# Patient Record
Sex: Male | Born: 1945 | Race: White | Hispanic: No | Marital: Married | Smoking: Former smoker
Health system: Southern US, Community
[De-identification: ages and names within clinical notes are randomized; demographics above are authoritative.]

## PROBLEM LIST (undated history)

## (undated) DIAGNOSIS — F40240 Claustrophobia: Secondary | ICD-10-CM

## (undated) DIAGNOSIS — K219 Gastro-esophageal reflux disease without esophagitis: Secondary | ICD-10-CM

## (undated) DIAGNOSIS — I10 Essential (primary) hypertension: Secondary | ICD-10-CM

## (undated) DIAGNOSIS — Z96651 Presence of right artificial knee joint: Secondary | ICD-10-CM

## (undated) DIAGNOSIS — G540 Brachial plexus disorders: Secondary | ICD-10-CM

## (undated) DIAGNOSIS — K5730 Diverticulosis of large intestine without perforation or abscess without bleeding: Secondary | ICD-10-CM

## (undated) DIAGNOSIS — F4310 Post-traumatic stress disorder, unspecified: Secondary | ICD-10-CM

## (undated) DIAGNOSIS — F419 Anxiety disorder, unspecified: Secondary | ICD-10-CM

## (undated) DIAGNOSIS — J310 Chronic rhinitis: Secondary | ICD-10-CM

## (undated) DIAGNOSIS — Z87898 Personal history of other specified conditions: Secondary | ICD-10-CM

## (undated) DIAGNOSIS — E785 Hyperlipidemia, unspecified: Secondary | ICD-10-CM

## (undated) DIAGNOSIS — M1990 Unspecified osteoarthritis, unspecified site: Secondary | ICD-10-CM

## (undated) HISTORY — DX: Brachial plexus disorders: G54.0

## (undated) HISTORY — DX: Presence of right artificial knee joint: Z96.651

## (undated) HISTORY — DX: Hyperlipidemia, unspecified: E78.5

## (undated) HISTORY — DX: Gastro-esophageal reflux disease without esophagitis: K21.9

## (undated) HISTORY — DX: Chronic rhinitis: J31.0

## (undated) HISTORY — PX: TONSILLECTOMY: SUR1361

## (undated) HISTORY — DX: Anxiety disorder, unspecified: F41.9

## (undated) HISTORY — DX: Unspecified osteoarthritis, unspecified site: M19.90

## (undated) HISTORY — PX: COLONOSCOPY: SHX174

## (undated) HISTORY — PX: CATARACT EXTRACTION: SUR2

## (undated) HISTORY — DX: Diverticulosis of large intestine without perforation or abscess without bleeding: K57.30

## (undated) HISTORY — DX: Personal history of other specified conditions: Z87.898

---

## 1998-02-12 ENCOUNTER — Emergency Department (HOSPITAL_COMMUNITY): Admission: EM | Admit: 1998-02-12 | Discharge: 1998-02-12 | Payer: Self-pay | Admitting: Emergency Medicine

## 1998-02-14 ENCOUNTER — Emergency Department (HOSPITAL_COMMUNITY): Admission: EM | Admit: 1998-02-14 | Discharge: 1998-02-14 | Payer: Self-pay | Admitting: Emergency Medicine

## 1999-07-11 ENCOUNTER — Ambulatory Visit (HOSPITAL_COMMUNITY): Admission: RE | Admit: 1999-07-11 | Discharge: 1999-07-11 | Payer: Self-pay | Admitting: Specialist

## 1999-07-11 ENCOUNTER — Encounter: Payer: Self-pay | Admitting: Specialist

## 1999-08-05 ENCOUNTER — Ambulatory Visit (HOSPITAL_COMMUNITY): Admission: RE | Admit: 1999-08-05 | Discharge: 1999-08-05 | Payer: Self-pay | Admitting: Neurosurgery

## 1999-08-05 ENCOUNTER — Encounter: Payer: Self-pay | Admitting: Neurosurgery

## 1999-09-29 ENCOUNTER — Encounter: Payer: Self-pay | Admitting: Neurosurgery

## 1999-09-29 ENCOUNTER — Ambulatory Visit (HOSPITAL_COMMUNITY): Admission: RE | Admit: 1999-09-29 | Discharge: 1999-09-29 | Payer: Self-pay | Admitting: Neurosurgery

## 2000-12-12 ENCOUNTER — Ambulatory Visit: Admission: RE | Admit: 2000-12-12 | Discharge: 2000-12-12 | Payer: Self-pay | Admitting: Internal Medicine

## 2001-07-16 ENCOUNTER — Ambulatory Visit (HOSPITAL_COMMUNITY): Admission: RE | Admit: 2001-07-16 | Discharge: 2001-07-16 | Payer: Self-pay | Admitting: Internal Medicine

## 2002-06-17 ENCOUNTER — Encounter: Admission: RE | Admit: 2002-06-17 | Discharge: 2002-06-17 | Payer: Self-pay | Admitting: Internal Medicine

## 2002-07-07 ENCOUNTER — Ambulatory Visit (HOSPITAL_COMMUNITY): Admission: RE | Admit: 2002-07-07 | Discharge: 2002-07-07 | Payer: Self-pay | Admitting: Specialist

## 2003-02-24 ENCOUNTER — Encounter: Admission: RE | Admit: 2003-02-24 | Discharge: 2003-02-24 | Payer: Self-pay | Admitting: Neurosurgery

## 2003-02-24 ENCOUNTER — Encounter: Payer: Self-pay | Admitting: Neurosurgery

## 2004-08-22 ENCOUNTER — Ambulatory Visit: Payer: Self-pay | Admitting: Internal Medicine

## 2004-12-05 ENCOUNTER — Ambulatory Visit: Payer: Self-pay | Admitting: Internal Medicine

## 2006-04-11 ENCOUNTER — Ambulatory Visit: Payer: Self-pay | Admitting: Internal Medicine

## 2006-07-09 ENCOUNTER — Ambulatory Visit: Payer: Self-pay | Admitting: Internal Medicine

## 2006-09-03 ENCOUNTER — Ambulatory Visit: Payer: Self-pay | Admitting: Internal Medicine

## 2006-10-08 ENCOUNTER — Ambulatory Visit: Payer: Self-pay | Admitting: Internal Medicine

## 2006-10-08 LAB — CONVERTED CEMR LAB
Albumin: 3.8 g/dL (ref 3.5–5.2)
Alkaline Phosphatase: 56 units/L (ref 39–117)
Basophils Absolute: 0 10*3/uL (ref 0.0–0.1)
CO2: 30 meq/L (ref 19–32)
Chol/HDL Ratio, serum: 4.6
Glomerular Filtration Rate, Af Am: 79 mL/min/{1.73_m2}
Glucose, Bld: 98 mg/dL (ref 70–99)
Ketones, ur: NEGATIVE mg/dL
LDL DIRECT: 142.7 mg/dL
Lymphocytes Relative: 26.9 % (ref 12.0–46.0)
Monocytes Relative: 10.5 % (ref 3.0–11.0)
Neutro Abs: 2.8 10*3/uL (ref 1.4–7.7)
Nitrite: NEGATIVE
Platelets: 239 10*3/uL (ref 150–400)
Potassium: 4.6 meq/L (ref 3.5–5.1)
RDW: 13.2 % (ref 11.5–14.6)
TSH: 2.52 microintl units/mL (ref 0.35–5.50)
Total Bilirubin: 1.1 mg/dL (ref 0.3–1.2)
Total Protein: 7 g/dL (ref 6.0–8.3)
Urobilinogen, UA: 0.2 (ref 0.0–1.0)
VLDL: 12 mg/dL (ref 0–40)
pH: 6 (ref 5.0–8.0)

## 2007-09-26 HISTORY — PX: CERVICAL DISC SURGERY: SHX588

## 2007-11-12 ENCOUNTER — Ambulatory Visit (HOSPITAL_COMMUNITY): Admission: RE | Admit: 2007-11-12 | Discharge: 2007-11-13 | Payer: Self-pay | Admitting: Neurosurgery

## 2008-01-09 ENCOUNTER — Encounter: Admission: RE | Admit: 2008-01-09 | Discharge: 2008-01-09 | Payer: Self-pay | Admitting: Neurosurgery

## 2008-03-18 ENCOUNTER — Ambulatory Visit: Payer: Self-pay | Admitting: Gastroenterology

## 2008-03-23 ENCOUNTER — Ambulatory Visit: Payer: Self-pay | Admitting: Gastroenterology

## 2008-09-16 ENCOUNTER — Ambulatory Visit: Payer: Self-pay | Admitting: Internal Medicine

## 2008-09-16 DIAGNOSIS — F411 Generalized anxiety disorder: Secondary | ICD-10-CM | POA: Insufficient documentation

## 2008-09-16 DIAGNOSIS — K5730 Diverticulosis of large intestine without perforation or abscess without bleeding: Secondary | ICD-10-CM | POA: Insufficient documentation

## 2008-09-16 DIAGNOSIS — E785 Hyperlipidemia, unspecified: Secondary | ICD-10-CM | POA: Insufficient documentation

## 2008-09-16 DIAGNOSIS — J069 Acute upper respiratory infection, unspecified: Secondary | ICD-10-CM | POA: Insufficient documentation

## 2008-09-16 DIAGNOSIS — M179 Osteoarthritis of knee, unspecified: Secondary | ICD-10-CM | POA: Insufficient documentation

## 2008-09-16 DIAGNOSIS — R51 Headache: Secondary | ICD-10-CM | POA: Insufficient documentation

## 2008-09-16 DIAGNOSIS — J310 Chronic rhinitis: Secondary | ICD-10-CM | POA: Insufficient documentation

## 2008-09-16 DIAGNOSIS — M1711 Unilateral primary osteoarthritis, right knee: Secondary | ICD-10-CM

## 2008-09-16 DIAGNOSIS — R42 Dizziness and giddiness: Secondary | ICD-10-CM | POA: Insufficient documentation

## 2008-09-16 DIAGNOSIS — R519 Headache, unspecified: Secondary | ICD-10-CM | POA: Insufficient documentation

## 2008-10-27 ENCOUNTER — Ambulatory Visit: Payer: Self-pay | Admitting: Internal Medicine

## 2008-10-27 DIAGNOSIS — R05 Cough: Secondary | ICD-10-CM

## 2008-10-27 DIAGNOSIS — R059 Cough, unspecified: Secondary | ICD-10-CM | POA: Insufficient documentation

## 2009-01-01 IMAGING — RF DG CERVICAL SPINE 1V
1 series · 1 of 1 positions shown · non-contrast
Comparison: none

CLINICAL DATA: Herniated nucleus pulposus.
 CERVICAL SPINE ? 1 VIEW:

[Series 1: run · 1 of 1 slices shown]
[im 1/1]
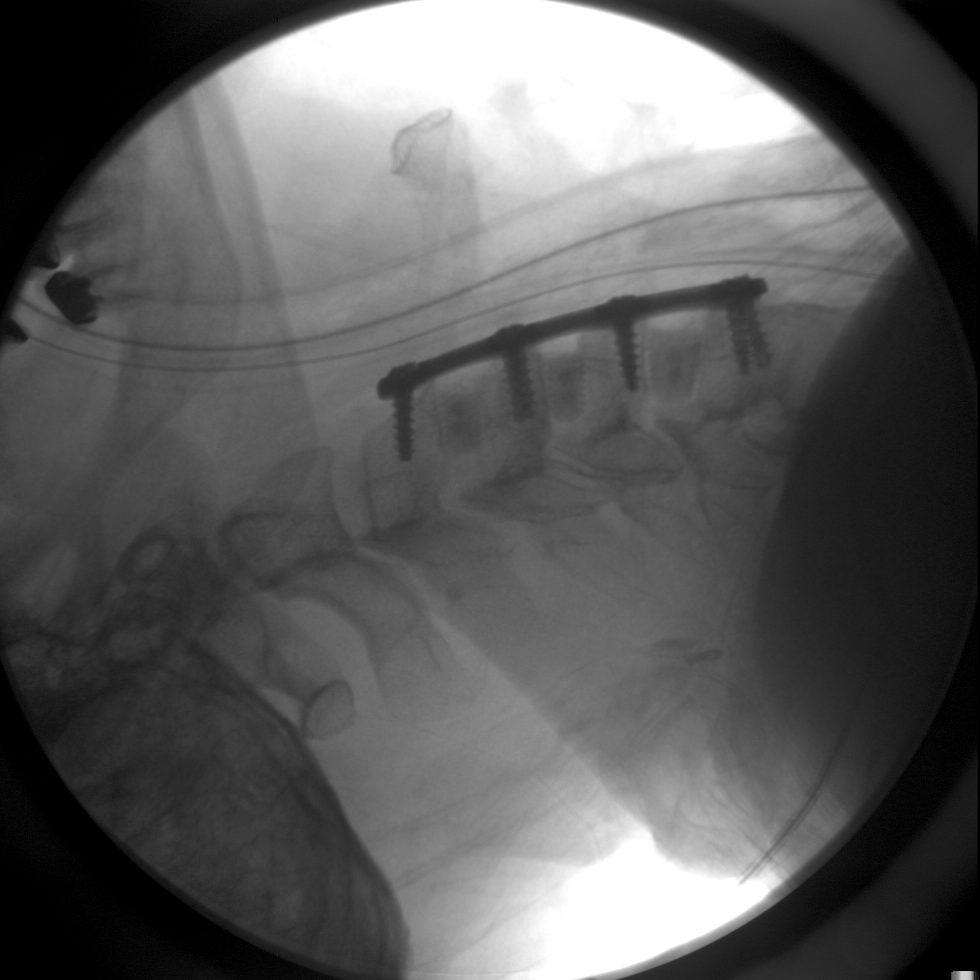

[1 of 1 positions shown; findings below may reference images not displayed]

FINDINGS: Anterior plates and screws with interposed tubular bone grafts are present for C-3, C-4, C-5 and C-6 fusion.  There is anatomic alignment of the vertebral bodies.  There is no breakage or loosening of the hardware. 
 Previous fusion at C6-7 is noted.
IMPRESSION: Anterior fusion C-3 through C-6.

## 2009-08-05 ENCOUNTER — Ambulatory Visit: Payer: Self-pay | Admitting: Internal Medicine

## 2009-08-06 LAB — CONVERTED CEMR LAB
ALT: 24 units/L (ref 0–53)
AST: 30 units/L (ref 0–37)
Albumin: 4.2 g/dL (ref 3.5–5.2)
Alkaline Phosphatase: 60 units/L (ref 39–117)
Basophils Relative: 0.3 % (ref 0.0–3.0)
Eosinophils Relative: 6.7 % — ABNORMAL HIGH (ref 0.0–5.0)
GFR calc non Af Amer: 79.94 mL/min (ref >60)
Glucose, Bld: 92 mg/dL (ref 70–99)
HCT: 43 % (ref 39.0–52.0)
Hemoglobin: 14.7 g/dL (ref 13.0–17.0)
LDL Cholesterol: 144 mg/dL — ABNORMAL HIGH (ref 0–99)
Lymphocytes Relative: 25.3 % (ref 12.0–46.0)
Lymphs Abs: 1.4 10*3/uL (ref 0.7–4.0)
Monocytes Relative: 11.3 % (ref 3.0–12.0)
Neutro Abs: 3 10*3/uL (ref 1.4–7.7)
Potassium: 5 meq/L (ref 3.5–5.1)
RBC: 4.54 M/uL (ref 4.22–5.81)
Sodium: 141 meq/L (ref 135–145)
TSH: 1.94 microintl units/mL (ref 0.35–5.50)
Total CHOL/HDL Ratio: 5
VLDL: 11 mg/dL (ref 0.0–40.0)
WBC: 5.4 10*3/uL (ref 4.5–10.5)

## 2009-12-16 ENCOUNTER — Ambulatory Visit: Payer: Self-pay | Admitting: Internal Medicine

## 2010-09-06 ENCOUNTER — Encounter: Payer: Self-pay | Admitting: Internal Medicine

## 2010-10-25 NOTE — Assessment & Plan Note (Signed)
Summary: Primary svc/ acute cough eval   Primary Provider/Referring Provider:  Sherene Sires  CC:  Acute visit.  Pt c/o "croupy cough" x 5 days.  He states that cough is prod but unable to bring up any sputum.  Marland Kitchen  History of Present Illness: 63 yowm quit smoking mid 20's  follwed here for DJD and hyperlipidemia.   October 27, 2008 ov co at bedtime nasal obstruction and sensation of drainage when lies on back but no sign excess mucus indolent onset, persistent x "years and years". No improvement of this co with nasal steroids, abx, decongestants.    August 05, 2009 cpx states nasal symptoms better with saline not using afrin at all now.   December 16, 2009 Acute visit.  Pt c/o "croupy cough" x 5 days.  He states that cough is prod but unable to bring up any sputum.  Started with throat irritation, water rhinitis that turned yellow. Pt denies any significant dysphagia, itching, sneezing,   fever, chills, sweats, unintended wt loss, pleuritic or exertional cp, hempoptysis, change in activity tolerance  orthopnea pnd or leg swelling. Pt also denies any obvious fluctuation in symptoms with weather or environmental change or other alleviating or aggravating factors although thinks it may be related to spring x last 10 year pattern.  Current Medications (verified): 1)  Bayer Aspirin Ec Low Dose 81 Mg Tbec (Aspirin) .... Take 1 Capsule By Mouth Once A Day 2)  Celebrex 200 Mg Caps (Celecoxib) .Marland Kitchen.. 1 Two Times A Day As Needed With Meals 3)  Fish Oil 1000 Mg Caps (Omega-3 Fatty Acids) .Marland Kitchen.. 1 Once Daily 4)  Multivitamins  Tabs (Multiple Vitamin) .Marland Kitchen.. 1 Once Daily  Allergies (verified): No Known Drug Allergies  Past History:  Past Medical History: HYPERLIPIDEMIA (ICD-272.4)    - Target LDL < 130 male gender, pos fm hx DEGENERATIVE JOINT DISEASE (ICD-715.90) DIVERTICULOSIS OF COLON (ICD-562.10)........................................Marland KitchenPatterson    - Colonoscopy 04/20/03 and 02/2008 CHRONIC RHINITIS  (ICD-472.0) ANXIETY, CHRONIC (ICD-300.00) HEADACHE (ICD-784.0) Hx of VERTIGO (ICD-780.4) GERD     - Pos EGD 1991 for GERD with 3-4 cm HH Health Maintenance............................................................Marland KitchenWert    -  DT 3/06    -  Pneumovax 10/7    -  CPX  August 05, 2009   Vital Signs:  Patient profile:   65 year old male Weight:      189 pounds O2 Sat:      98 % on Room air Temp:     98.0 degrees F oral Pulse rate:   72 / minute BP sitting:   152 / 82  (left arm)  Vitals Entered By: Vernie Murders (December 16, 2009 9:27 AM)  O2 Flow:  Room air  Physical Exam  Additional Exam:  wt 195 > 197 October 27, 2008 > 194 August 05, 2009 > 189 December 16, 2009  GEN: A/Ox3; pleasant , NAD  with honking upper airway cough and nasal tone to voice HEENT:  [place]/AT, , EACs-clear, TMs-wnl, NOSE-pale, nontender sinus , THROAT-clear NECK:  Supple w/ fair ROM; no JVD; normal carotid impulses w/o bruits; no thyromegaly or nodules palpated; no lymphadenopathy. CHEST:  Clear to P & A; w/o, wheezes/ rales/ or rhonchi. HEART:  RRR, no m/r/g   ABDOMEN:  Soft & nt; nml bowel sounds; no organomegaly or masses detected. EXT: Warm bil,  no calf pain, edema, clubbing, pulses intact    Impression & Recommendations:  Problem # 1:  UPPER RESPIRATORY INFECTION (ICD-465.9)  Explained natural h/o uri and  why it's necessary in patients at risk to rx short term with PPI to reduce risk of evolving cyclical cough triggered by epithelial injury and a heightened sensitivty to the effects of any upper airway irritants,  most importantly acid - related.   Each maintenance medication was reviewed in detail including most importantly the difference between maintenance and as needed and under what circumstances the prns are to be used. See instructions for specific recommendations     Orders: Est. Patient Level IV (46962)  Problem # 2:  CHRONIC RHINITIS (ICD-472.0)  as long as just seasonal can do prns  otc, reviewed  Orders: Est. Patient Level IV (95284)  Medications Added to Medication List This Visit: 1)  Prednisone 10 Mg Tabs (Prednisone) .... 4 each am x 2days, 2x2days, 1x2days and stop 2)  Doxycycline Hyclate 100 Mg Caps (Doxycycline hyclate) .... One twice daily before meals  Patient Instructions: 1)  Acid reflux is a eading suspect here and needs to be eliminated  completely before considering additional studies or treatment options. To suppress this maximally, take Nexium 30 min before first  meal and zantac 150 (otc) at bedtime plus diet measures as listed.  2)  Prednisone 4 each am x 2days, 2x2days, 1x2days and stop 3)  Doxycycline 100 mg twice daily before meals x 7 days only 4)  Mucinex dm 1-2 every 12hours for cough 5)  GERD (REFLUX)  is a common cause of respiratory symptoms. It commonly presents without heartburn and can be treated with medication, but also with lifestyle changes including avoidance of late meals, excessive alcohol, smoking cessation, and avoid fatty foods, chocolate, peppermint, colas, red wine, and acidic juices such as orange juice. NO MINT OR MENTHOL PRODUCTS SO NO COUGH DROPS  6)  USE SUGARLESS CANDY INSTEAD (jolley ranchers)  7)  NO OIL BASED VITAMINS  Prescriptions: DOXYCYCLINE HYCLATE 100 MG CAPS (DOXYCYCLINE HYCLATE) one twice daily before meals  #14 x 0   Entered and Authorized by:   Nyoka Cowden MD   Signed by:   Nyoka Cowden MD on 12/16/2009   Method used:   [facility] to        [facility] [phone]* (retail)       [address]       [place], [place]  [address]       Ph: [phone]       Fax: [phone]   RxID:   [id number] PREDNISONE 10 MG  TABS (PREDNISONE) 4 each am x 2days, 2x2days, 1x2days and stop  #14 x 0   Entered and Authorized by:   Nyoka Cowden MD   Signed by:   Nyoka Cowden MD on 12/16/2009   Method used:   [facility]ically to        [facility] [phone]* (retail)        [address]       [place], [place]  [address]       Ph: [phone]       Fax: [phone]   RxID:   [id number]

## 2010-10-27 NOTE — Letter (Signed)
Summary: [facility]   Imported By: Lester Chester 10/05/2010 10:36:17  _____________________________________________________________________  External Attachment:    Type:   Image     Comment:   External Document

## 2010-11-10 ENCOUNTER — Other Ambulatory Visit: Payer: Self-pay | Admitting: Internal Medicine

## 2010-11-10 ENCOUNTER — Encounter (INDEPENDENT_AMBULATORY_CARE_PROVIDER_SITE_OTHER): Payer: Self-pay | Admitting: *Deleted

## 2010-11-10 ENCOUNTER — Encounter: Payer: Self-pay | Admitting: Internal Medicine

## 2010-11-10 ENCOUNTER — Encounter (INDEPENDENT_AMBULATORY_CARE_PROVIDER_SITE_OTHER): Payer: MEDICARE | Admitting: Internal Medicine

## 2010-11-10 ENCOUNTER — Other Ambulatory Visit: Payer: MEDICARE

## 2010-11-10 DIAGNOSIS — Z0000 Encounter for general adult medical examination without abnormal findings: Secondary | ICD-10-CM

## 2010-11-10 DIAGNOSIS — Z23 Encounter for immunization: Secondary | ICD-10-CM

## 2010-11-10 DIAGNOSIS — E785 Hyperlipidemia, unspecified: Secondary | ICD-10-CM

## 2010-11-10 LAB — LIPID PANEL
LDL Cholesterol: 143 mg/dL — ABNORMAL HIGH (ref 0–99)
Total CHOL/HDL Ratio: 4
VLDL: 11.8 mg/dL (ref 0.0–40.0)

## 2010-11-10 LAB — BASIC METABOLIC PANEL
Chloride: 105 mEq/L (ref 96–112)
Creatinine, Ser: 1 mg/dL (ref 0.4–1.5)
GFR: 84.48 mL/min (ref >60.00)
Potassium: 5.1 mEq/L (ref 3.5–5.1)

## 2010-11-10 LAB — URINALYSIS
Bilirubin Urine: NEGATIVE
Ketones, ur: NEGATIVE
Leukocytes, UA: NEGATIVE
Specific Gravity, Urine: 1.02 (ref 1.000–1.030)
Total Protein, Urine: NEGATIVE
Urine Glucose: NEGATIVE
pH: 7.5 (ref 5.0–8.0)

## 2010-11-10 LAB — HEPATIC FUNCTION PANEL
ALT: 20 U/L (ref 0–53)
AST: 24 U/L (ref 0–37)
Bilirubin, Direct: 0.1 mg/dL (ref 0.0–0.3)
Total Bilirubin: 0.8 mg/dL (ref 0.3–1.2)
Total Protein: 6.7 g/dL (ref 6.0–8.3)

## 2010-11-10 LAB — CBC WITH DIFFERENTIAL/PLATELET
Basophils Relative: 0.5 % (ref 0.0–3.0)
Eosinophils Relative: 4.6 % (ref 0.0–5.0)
HCT: 37.7 % — ABNORMAL LOW (ref 39.0–52.0)
MCV: 92.8 fl (ref 78.0–100.0)
Monocytes Absolute: 0.5 10*3/uL (ref 0.1–1.0)
Monocytes Relative: 10.2 % (ref 3.0–12.0)
Neutrophils Relative %: 62.4 % (ref 43.0–77.0)
Platelets: 233 10*3/uL (ref 150.0–400.0)
RBC: 4.06 Mil/uL — ABNORMAL LOW (ref 4.22–5.81)
WBC: 5.2 10*3/uL (ref 4.5–10.5)

## 2010-11-10 LAB — CONVERTED CEMR LAB
PSA, Free Pct: 37 (ref >25)
PSA, Free: 0.2 ng/mL

## 2010-11-10 LAB — HIGH SENSITIVITY CRP: CRP, High Sensitivity: 0.98 mg/L (ref 0.00–5.00)

## 2010-11-14 ENCOUNTER — Telehealth (INDEPENDENT_AMBULATORY_CARE_PROVIDER_SITE_OTHER): Payer: Self-pay | Admitting: *Deleted

## 2010-11-16 NOTE — Assessment & Plan Note (Signed)
Summary: Primary svc/ CPX    Primary Provider/Referring Provider:  Sherene Sires  CC:  cpx fasting.  History of Present Illness: 65  yowm quit smoking mid 20's  follwed here for DJD and hyperlipidemia.   October 27, 2008 ov co at bedtime nasal obstruction and sensation of drainage when lies on back but no sign excess mucus indolent onset, persistent x "years and years". No improvement of this co with nasal steroids, abx, decongestants.    August 05, 2009 cpx states nasal symptoms better with saline not using afrin at all now.   December 16, 2009 Acute visit.  Pt c/o "croupy cough" x 5 days.  He states that cough is prod but unable to bring up any sputum.  Started with throat irritation, water rhinitis that turned yellow.  rx with doxy and short term gerd rx  November 10, 2010 cpx with chronic sleep disorder (? [facility]) and nasal stuffiness with overuse of afrin and underuse of nasonex.  no purulent nasal secretions. Pt denies any significant sore throat, dysphagia, itching, sneezing,  nasal congestion or excess secretions,  fever, chills, sweats, unintended wt loss, pleuritic or exertional cp, hempoptysis,   orthopnea pnd or leg swelling.  Pt also denies any obvious fluctuation in symptoms with weather or environmental change or other alleviating or aggravating factors.       Current Medications (verified): 1)  Bayer Aspirin Ec Low Dose 81 Mg Tbec (Aspirin) .... Take 1 Capsule By Mouth Once A Day 2)  Celebrex 200 Mg Caps (Celecoxib) .Marland Kitchen.. 1 Two Times A Day As Needed With Meals 3)  Multivitamins  Tabs (Multiple Vitamin) .Marland Kitchen.. 1 Once Daily 4)  Xanax 1 Mg Tabs (Alprazolam) .Marland Kitchen.. 1 At Bedtime As Needed  Allergies (verified): No Known Drug Allergies  Past History:  Past Medical History: HYPERLIPIDEMIA (ICD-272.4)    - Target LDL < 130 male gender, pos fm hx DEGENERATIVE JOINT DISEASE (ICD-715.90) DIVERTICULOSIS OF COLON (ICD-562.10)........................................Marland KitchenJarold Motto    -  Colonoscopy 04/20/03 and 02/2008 CHRONIC RHINITIS (ICD-472.0) ANXIETY, CHRONIC (ICD-300.00) > [facility] HEADACHE (ICD-784.0) Hx of VERTIGO (ICD-780.4) GERD     - Pos EGD 1991 for GERD with 3-4 cm HH Health Maintenance............................................................Marland KitchenWert    -  DT 11/2004    -  Pneumovax November 10, 2010     -  CPX  November 10, 2010   Family History: heart disease in father age 53's colon cancer paternal side uncle and aunt anxiety in sister/ hypochondriac per pt  Social History: Patient states former smoker,  quit around 13 [employer] park [employer]- retired Works out at [employer] and volunteers at [employer]  Vital Signs:  Patient profile:   65 year old male Weight:      192 pounds BMI:     25.42 O2 Sat:      98 % on Room air Temp:     97.8 degrees F oral Pulse rate:   62 / minute BP sitting:   100 / 60  (left arm)  Vitals Entered By: Vernie Murders (November 10, 2010 8:48 AM)  O2 Flow:  Room air  Physical Exam  Additional Exam:  wt 195 > 197 October 27, 2008 > 194 August 05, 2009 > 189 December 16, 2009 > 192 November 10, 2010  GEN: A/Ox3; pleasant , NAD   HEENT:  /AT, , EACs-clear, TMs-wnl, NOSE-pale, nontender sinus , THROAT-clear NECK:  Supple w/ fair ROM; no JVD; normal carotid impulses w/o bruits; no thyromegaly or nodules  palpated; no lymphadenopathy. CHEST:  Clear to P & A; w/o, wheezes/ rales/ or rhonchi. HEART:  RRR, no m/r/g   ABDOMEN:  Soft & nt; nml bowel sounds; no organomegaly or masses detected. EXT: Warm bil,  no calf pain, edema, clubbing, pulses intact GU uncircum testes down bilaterally no IH or nodules Rectal mod ext hemorrhoids/ nl prostate/ stool g neg  MS nl gait and station, no obvious joint restriction/ swelling Cholesterol               200 mg/dL                   1-478     ATP III Classification            Desirable:  < 200 mg/dL                    Borderline High:  200 - 239 mg/dL                High:  > = 240 mg/dL   Triglycerides             59.0 mg/dL                  2.9-562.1     Normal:  <150 mg/dL     Borderline High:  308 - 199 mg/dL   HDL                       65.78 mg/dL                 >46.96   VLDL Cholesterol          11.8 mg/dL                  2.9-52.8   LDL Cholesterol      [H]  413 mg/dL                   2-44  CHO/HDL Ratio:  CHD Risk                             4                    Men          Women     1/2 Average Risk     3.4          3.3     Average Risk          5.0          4.4     2X Average Risk          9.6          7.1     3X Average Risk          15.0          11.0                           Tests: (2) BMP (METABOL)   Sodium                    140 mEq/L                   135-145   Potassium  5.1 mEq/L                   3.5-5.1   Chloride                  105 mEq/L                   96-112   Carbon Dioxide            29 mEq/L                    19-32   Glucose                   82 mg/dL                    13-08   BUN                       21 mg/dL                    6-57   Creatinine                1.0 mg/dL                   8.4-6.9   Calcium                   8.8 mg/dL                   6.2-95.2   GFR                       84.48 mL/min                >60.00  Tests: (3) CBC Platelet w/Diff (CBCD)   White Cell Count          5.2 K/uL                    4.5-10.5   Red Cell Count       [L]  4.06 Mil/uL                 4.22-5.81   Hemoglobin           [L]  12.8 g/dL                   84.1-32.4   Hematocrit           [L]  37.7 %                      39.0-52.0   MCV                       92.8 fl                     78.0-100.0   MCHC                      34.0 g/dL                   40.1-02.7   RDW                       14.5 %  11.5-14.6   Platelet Count            233.0 K/uL                  150.0-400.0   Neutrophil %              62.4 %                      43.0-77.0   Lymphocyte %              22.3 %                       12.0-46.0   Monocyte %                10.2 %                      3.0-12.0   Eosinophils%              4.6 %                       0.0-5.0   Basophils %               0.5 %                       0.0-3.0   Neutrophill Absolute      3.2 K/uL                    1.4-7.7   Lymphocyte Absolute       1.2 K/uL                    0.7-4.0   Monocyte Absolute         0.5 K/uL                    0.1-1.0  Eosinophils, Absolute                             0.2 K/uL                    0.0-0.7   Basophils Absolute        0.0 K/uL                    0.0-0.1  Tests: (4) Hepatic/Liver Function Panel (HEPATIC)   Total Bilirubin           0.8 mg/dL                   7.8-2.9   Direct Bilirubin          0.1 mg/dL                   5.6-2.1   Alkaline Phosphatase      54 U/L                      39-117   AST                       24 U/L                      0-37   ALT  20 U/L                      0-53   Total Protein             6.7 g/dL                    1.6-1.0   Albumin                   3.9 g/dL                    9.6-0.4  Tests: (5) TSH (TSH)   FastTSH                   2.10 uIU/mL                 0.35-5.50  Tests: (6) Full Range CRP (FCRP)   CRPH                      0.98 mg/L                   0.00-5.00     Note:  An elevated hs-CRP (>5 mg/L) should be repeated after 2 weeks to rule out recent infection or trauma.  Tests: (7) UDip Only (UDIP)   Color                     LT. YELLOW       RANGE:  Yellow;Lt. Yellow   Clarity                   CLEAR                       Clear   Specific Gravity          1.020                       1.000 - 1.030   Urine Ph                  7.5                         5.0-8.0   Protein                   NEGATIVE                    Negative   Urine Glucose             NEGATIVE                    Negative   Ketones                   NEGATIVE                    Negative   Urine Bilirubin           NEGATIVE                    Negative   Blood                      NEGATIVE                    Negative  Urobilinogen              0.2                         0.0 - 1.0   Leukocyte Esterace        NEGATIVE                    Negative   Nitrite                   NEGATIVE                    Negative   Tests: (1) PSA, Total and Free (3515)   PSA                       0.54 ng/mL                  <=4.00     Test Methodology: Hybritech PSA   PSA, Free                 0.2 ng/mL   PSA, %Free                37 %                        > 25  Impression & Recommendations:  Problem # 1:  HYPERLIPIDEMIA (ICD-272.4) Target LDL < 130 due to type A and male with pos fm hx  Labs Reviewed: SGOT: 30 (08/05/2009)   SGPT: 24 (08/05/2009)   HDL:44.20 (08/05/2009), 44.6 (10/08/2006)  LDL:144 (08/05/2009),  vs 143 November 10, 2010  DEL (10/08/2006)  Chol:199 (08/05/2009), 206 (10/08/2006)  Trig:55.0 (08/05/2009), 59 (10/08/2006)  Problem # 2:  CHRONIC RHINITIS (ICD-472.0) I emphasized that nasal steroids have no immediate benefit in terms of improving symptoms.  To help them reached the target tissue, the patient should use Afrin two puffs every 12 hours applied one min before using the nasal steroids.  Afrin should be stopped after no more than 5 days.  If the symptoms worsen, Afrin can be restarted after 5 days off of therapy to prevent rebound congestion from overuse of Afrin.  I also emphasized that in no way are nasal steroids a concern in terms of "addiction".      Problem # 3:  ANXIETY, CHRONIC (ICD-300.00)  His updated medication list for this problem includes:    Xanax 1 Mg Tabs (Alprazolam) .Marland Kitchen... 1 at bedtime as needed  Per [facility], all benzo's at one source  See instructions for specific recommendations   Medications Added to Medication List This Visit: 1)  Xanax 1 Mg Tabs (Alprazolam) .Marland Kitchen.. 1 at bedtime as needed  Other Orders: Est. Patient 65& > ([address]) T-PSA Total ([address]) Pneumococcal Vaccine (19147) Admin 1st Vaccine  [phone]) Admin 1st Vaccine (State) (90471S) TLB-Lipid Panel (80061-LIPID) TLB-BMP (Basic Metabolic Panel-BMET) (80048-METABOL) TLB-CBC Platelet - w/Differential (85025-CBCD) TLB-Hepatic/Liver Function Pnl (80076-HEPATIC) TLB-TSH (Thyroid Stimulating Hormone) (84443-TSH) TLB-CRP-High Sensitivity (C-Reactive Protein) (86140-FCRP) TLB-Udip ONLY (81003-UDIP)  Patient Instructions: 1)  If need afrin more than 5 days then you return for more definitive therapy because of the risk of addiction and rebound (the same applies to xanax)  2)  Xanax is effect to treat an active panic attack but should not be taken in this dose on a regular basis or for sleep.  If the problem is waking up  frequently with panic then the issue is = is there something you can take automatically at bedtime like trazadone  3)  Call [phone] for your results w/in next 3 days - if there's something important  I feel you need to know,  I'll be in touch with you directly.    Pneumovax Vaccine    Vaccine Type: Pneumovax (Medicare)    Site: right deltoid    Mfr: Merck    Dose: 0.5 ml    Given by: Vernie Murders    Exp. Date: 02/17/2012    Lot #: [id number]    VIS given: 08/30/09 version given November 10, 2010.

## 2010-11-22 NOTE — Progress Notes (Signed)
Summary: sinus congestion-LMTCBx2  Phone Note Call from Patient Call back at Home Phone [phone]   Caller: Patient Call For: wert Summary of Call: pt was seen by dr wert 2/16. over the weekend he got a sinus infection/ nasal drainage. says his teeth and face hurt. temp 99.1- he wiil come in to be seen if needed or [facility] [address]. pls advise pt home # first or cell [phone] Initial call taken by: Tivis Ringer, CNA,  November 14, 2010 9:58 AM  Follow-up for Phone Call        [email or web address].Carron Curie CMA  November 14, 2010 12:12 PM [facility]  x 2 Vernie Murders  November 15, 2010 12:34 PM'  Pt called back and stated that he found out that he had an abscess in his tooth which was causing the pain and that nothing further is needed for this office.Darletta Moll  November 15, 2010 1:00 PM

## 2010-11-24 ENCOUNTER — Telehealth (INDEPENDENT_AMBULATORY_CARE_PROVIDER_SITE_OTHER): Payer: Self-pay | Admitting: *Deleted

## 2010-12-01 NOTE — Progress Notes (Signed)
  Phone Note Other Incoming   Request: Send information Summary of Call: Request for records received from [facility] of [facility]. Request forwarded to [facility].  All treatment records

## 2011-01-19 ENCOUNTER — Other Ambulatory Visit: Payer: Self-pay | Admitting: Internal Medicine

## 2011-02-07 NOTE — Op Note (Signed)
NAME:  Joshua [name], Joshua [name] NO.:  [id number]   MEDICAL RECORD NO.:  [id number]          PATIENT TYPE:  INP   LOCATION:  3533                         FACILITY:  [facility]   PHYSICIAN:  Sherilyn Cooter A. Pool, M.D.    DATE OF BIRTH:  October 09, 1945   DATE OF PROCEDURE:  11/12/2007  DATE OF DISCHARGE:                               OPERATIVE REPORT   PREOPERATIVE DIAGNOSIS:  Cervical stenosis with myelopathy and  radiculopathy.   POSTOPERATIVE DIAGNOSIS:  Cervical stenosis with myelopathy and  radiculopathy.   PROCEDURE NAME:  C3-4, C4-5 and C5-6 anterior cervical diskectomy and  fusion with allograft and plating.  Re-exploration of C6-7 anterior  cervical fusion with removal of C6-7 anterior instrumentation.   SURGEON:  Kathaleen Maser. Pool, M.D.   ASSISTANT:  Reinaldo Meeker, M.D.   ANESTHESIA:  General endotracheal.   PREMEDICATION:  Joshua [name] is a 65 year old male status post previous C6-7  anterior cervical diskectomy and fusion with allograft anterior plating  with good results.  The patient presents now with worsening neck pain  and bilateral upper extremity symptoms right greater than left,  consistent with both a cervical myelopathy and a cervical radiculopathy.  Workup demonstrates evidence of marked stenosis at C3-4, C4-5 and C5-6  with spinal cord and nerve root compression.  The patient has been  counseled as to his options.  He decided proceed with three-level  anterior decompression and fusion with instrumentation in hopes in  improving his symptoms.  This will require restoring his previous fusion  at C6-7.  The patient was aware the risks and benefits involving surgery  and wished to proceed.   OPERATIVE NOTE:  The patient was placed on the operative table in a  supine position.  After adequate level of anesthesia was achieved, the  patient positioned supine with his neck slightly extended and held in  place with halter traction.  The patient anterior cervical region  was  prepped and draped sterilely.  A 10 blade was used to make a linear  incision overlying the C6 vertebral level.  This was carried sharply to  the platysma.  Platysma was then divided vertically and dissection  proceeded along the medial border of sternomastoid muscle and carotid  sheath.  Trachea and esophagus were mobilized and directed towards the  right.  Prevertebral fascia was stripped off of the anterior spinal  column.  Longus colli muscle was elevated bilaterally using  electrocautery.  Deep self-retaining retractor was placed.  Intraoperative fluoroscopy was used, and levels were confirmed.  Previous plate at Z6-1 was dissected free and removed with the  appropriate screwdrivers.  A fusion at C6-7 was inspected and found to  be quite solid.  Disk spaces at C5-6, C4-5 and C3-4 were then incised  with the 15 blade in a rectangular fashion.  Wide disk space clean-out  was achieved using pituitary rongeurs, upward angled pituitary rongeurs,  and forward and backward Carlen curettes and a high-speed drill.  All  elements of the disk were removed down to the level of posterior  annulus.  Microscope was brought into  the field and used throughout the  remainder of diskectomy.  The remaining aspects of annulus and  osteophytes were removed using the high-speed drill down to the level of  the posterior longitudinal ligament.  Posterior longitudinal ligament  was elevated and resected in piecemeal fashion starting first at C3-4.  Underlying thecal sac was then identified.  Wide central decompression  was then performed by undercutting the bodies of C3 and C4.  Decompression then performed at each neural foramen.  Wide anterior  foraminotomies were then performed along the course of the exiting nerve  roots.  At this point, a very thorough decompression had been achieved.  There was no injury to thecal sac or nerve roots.  Procedure then  repeated at C4-5 and C5-6 again without  complication.  Wound was then  irrigated with antibiotic solution.  Gelfoam was placed topically for  hemostasis and found be good.  LifeNet allograft wedges were then  impacted into place and recessed approximately 1 mm from the anterior  cortical margin at C3-4, C4-5 and C5-6.  A 67-mm Atlantis anterior  cervical plate was then placed over the C3, 4, 5, 6 levels.  This was  then attached under fluoroscopic guidance using 13-mm variable angle  screws, two each at all four levels.  Locking screws then engaged at all  levels.  Final images revealed good position of bone grafts and hardware  at proper level and normal alignment of spine.  Wound was then irrigated  with antibiotic solution.  Hemostasis was achieved with bipolar  electrocautery.  A medium Blake drain was left in the prevertebral  space.  Wound was then closed in layers.  There was no complication.  The patient tolerated the procedure well, and he returned to the  recovery room for postoperative care.           ______________________________  Kathaleen Maser. Pool, M.D.     HAP/MEDQ  D:  11/12/2007  T:  11/13/2007  Job:  [id number]

## 2011-02-10 NOTE — Assessment & Plan Note (Signed)
[facility]                             PULMONARY OFFICE NOTE   NAME:Idrovo, NARAYAN SCULL                        MRN:          [id number]  DATE:09/03/2006                            DOB:          30-Jun-1946    HISTORY OF PRESENT ILLNESS:  This is a 65 year old white male patient of  Dr. Thurston Hole who has a known history of hyperlipidemia who presents for an  acute office visit.  The patient has complained of a 2-week history of  nasal congestion, sinus pain and pressure, with thick yellow discharge.  The patient denies any hemoptysis, chest pain, orthopnea, PND, recent  travel or antibiotic use.   PAST MEDICAL HISTORY:  Reviewed.   CURRENT MEDICATIONS:  Reviewed.   PHYSICAL EXAMINATION:  GENERAL:  The patient is a pleasant male in no  acute distress.  VITAL SIGNS:  He is afebrile with stable vital signs.  O2 saturation is  98% on room air.  HEENT:  Nasal mucosa is erythematous.  Sinus tenderness and pressure.  NECK:  Supple without adenopathy.  LUNGS:  The lung sounds are clear.  CARDIAC:  Regular rate.  ABDOMEN:  Soft and benign.  EXTREMITIES:  Warm without any edema.   IMPRESSION AND PLAN:  Acute sinusitis.  The patient is to begin  Augmentin x10 days.  Mucinex twice daily.  Nasal hygiene regimen with  saline and Afrin nasal spray x5 days.  The patient is to return back as  scheduled with Dr. Sherene Sires or sooner if needed.      Rubye Oaks, NP  Electronically Signed      Charlaine Dalton. Sherene Sires, MD, [facility]  Electronically Signed   TP/MedQ  DD: 09/03/2006  DT: 09/04/2006  Job #: [id number]

## 2011-02-10 NOTE — Assessment & Plan Note (Signed)
[facility]                               PULMONARY OFFICE NOTE   NAME:[name], Joshua CHIU                        MRN:          [id number]  DATE:04/11/2006                            DOB:          August 24, 1946    HISTORY:  A 65 year old white male with chronic anxiety and complaint of  intermittent vertigo and decreased hearing in both ears for the last six  months.  He denies any headache, nausea, fevers, chills, orthopnea, PND or  leg swelling.   He returns for lipid profile as requested but is a year and three months  late and did not remember he should be fasting.  However, he denies any  exertional chest pain, orthopnea, PND or leg swelling, GI or claudication  symptoms.   MEDICATION:  His only medication consists of a baby aspirin daily.   The only new complaint the patient has is intermittent positional aches and  pains in shoulders, hips and knees with no associated swelling or joint  stiffness.   PHYSICAL EXAMINATION:  GENERAL APPEARANCE:  He is pleasant ambulatory white  male in no acute distress.  VITAL SIGNS:  Stable.  HEENT:  Completely unremarkable.  LUNGS:  Clear.  __________.  CARDIOVASCULAR:  Regular rate and rhythm without murmurs, rubs, or gallops.  ABDOMEN:  Soft and benign.  EXTREMITIES:  Warm without calf tenderness, clubbing, cyanosis, or edema.   IMPRESSION:  1.  Bilateral hearing loss and intermittent vertigo.  He has already seen      Dr. Dorma Russell in the past for evaluation.  I have referred him back to Dr.      Dorma Russell.  2.  Hyperlipidemia.  He will need his target LDL less than 130 and needs to      return fasting within the next week for this purpose.  3.  He does report mild positional arthritis in shoulders, hips and knees.      I have recommended trial of Celebrex 200 mg one b.i.d. p.r.n.  4.  The record indicates it has been over a year since he has come in for      health care evaluation and will schedule that after  his next visit if he      keeps the appointment.  5.  Chronic anxiety and apparent difficulty following instructions and      focusing on medical issues.  I have recommended Xanax 0.5 mg to use no      more than one daily p.r.n.                                   Charlaine Dalton. Sherene Sires, MD, FCCP   MBW/MedQ  DD:  04/12/2006  DT:  04/12/2006  Job #:  [id number]

## 2011-02-10 NOTE — Assessment & Plan Note (Signed)
[facility]                             PULMONARY OFFICE NOTE   NAME:Joshua [name], Joshua [name]                        MRN:          [id number]  DATE:10/08/2006                            DOB:          1946-04-23    COMPREHENSIVE HEALTHCARE EVALUATION:   HISTORY:  [age]-old white male, former smoker, who returns for  followup evaluation of intermittent rhinitis with cough felt to be  secondary to postnasal drip syndrome (which goes away for months at a  time without flaring) and chronic mild dyspnea on exertion, which has  gradually improved over the last year.  He also has hyperlipidemia with  target goal of less than 130, based on male gender, positive family  history and type A personality.   He returns as requested for a comprehensive healthcare evaluation,  denying any exertional chest pain, orthopnea, PND or leg swelling and is  physically active including low-grade aerobics, running every other  day.   The main concern the patient has had is one of intermittent sinus  headache (he points to the area above his eyebrow).  This pain has no  specific features; it does not in terms of timing, relationship to nasal  congestion or relieving or alleviating factors.  He denies any  photophobia or neurologic complaints, but says that the headache been  coming back for years (note that this is the same headache for which  we previously obtained 2 negative sinus CT scans).  He was very  nonspecific in terms of responding to questions regarding caffeine use  and also whether or not Esgic Plus helped his headache previously.   PAST MEDICAL HISTORY:  1. Bilateral hearing loss with vertigo, previously followed by Dr.      Dorma Russell.  2. Status post diskectomy, C5-6, on the left by Dr. Jordan Likes in 2000.  3. Recurrent headaches as outlined above with 2 negative sinus CT      scans during acute exacerbation and the most recent, however, was      September of 2003.  4. Chronic anxiety, previously improved on Paxil, but did not want to      get hooked on it, now requesting Xanax for refills, but takes more      than two a week at a 0.5-mg dose.  5. Chronic rhinitis with no typical seasonal pattern, as noted above.  6. Diverticulosis by colonoscopy in 1999, repeated again on April 20, 2003 and in the computer for recall every 5 years, which will be      due in 2009.  7. Degenerative arthritis, previously followed by Dr. Dannette Barbara,      status post right knee angioplasty, October 2003.   MEDICATIONS:  Baby aspirin one daily.   SOCIAL HISTORY:  He is a remote smoker, having quit over 30 years ago.  He works in [employer] social research officer.   FAMILY HISTORY:  Positive for colon cancer in his mother, sister and  brother.  Father developed heart disease at age 68, but is still alive.  No prostate cancer in his family nor any diabetes  or hypertension to his  knowledge.  His mother may have organic brain syndrome, but she is in  her 85s.   REVIEW OF SYSTEMS:  Taken in detail on the worksheet and negative,  except as outlined above.   PHYSICAL EXAMINATION:  This is a pleasant, ambulatory white male in no  acute distress.  He is afebrile with normal vital signs.  Blood pressure is 112/70.  HEENT:  Unremarkable.  Oropharynx is clear.  NECK:  Supple without cervical adenopathy or tenderness.  Trachea is  midline.  No thyromegaly.  Lung fields are completely clear bilaterally to auscultation and  percussion.  There is mild pectus excavatum deformity.  There is a regular rate and rhythm without murmur, gallop or rub and no  displacement of PMI.  ABDOMEN:  Soft and benign.  No palpable organomegaly, masses or  tenderness.  Peripheral pulses were present bilaterally, no bruits.  Posterior tibial  was slightly stronger than the dorsalis pedis bilaterally.  Carotid  upstrokes were brisk, also without bruits.  GENITOURINARY:  Testes descended bilaterally with no nodules or  inguinal  hernia.  RECTAL:  Revealed moderate BPH, slight asymmetry, left greater than  right, but no definite nodules.  Stool guaiac was negative.  EXTREMITIES:  Warm, without calf tenderness, cyanosis, clubbing or  edema.  Pedal pulses were present bilaterally in symmetric fashion.  NEUROLOGIC:  No focal deficits or pathologic reflexes.  SKIN:  Warm and dry with no lesions.   LABORATORY DATA:  Chest x-ray was normal.   EKG was normal.   CBC was significant for 7% eosinophils.  Total cholesterol was 206 with  an HDL of 45 and LDL of 143.  Chemistry profile was normal.  CRP was  1.0.  PSA was 0.57 and TSH was normal.   IMPRESSION:  1. Chronic/recurrent headaches in a patient who has a diagnosis of      chronic rhinitis, is not consistent about using Nasonex as      prescribed and may or may not have a true sinus headache (the      evidence against this is that the headache does not have the      typical features and 2 negative sinus CT scans were obtained      previously).  I am going to recommend simply that the next time the      headache flares that he contact us for a full coronal CT scan      rather than treat himself empirically, other than with Esgic Plus.      In addition, I emphasized to him optimal treatment for rhinitis.      We have actually had the same conversation on 3 separate occasions      and he has received specific instructions on how to treat his      rhinitis, but has failed to follow through on the instructions as      they were written and finds that aspirin works about as well as      anything else and since he wants to use something on an as-needed      basis, aspirin would appear to be the best choice, but obviously is      not the best in terms of maintaining sinus ostial patency and I      explained this to him as well.  2. Hyperlipidemia.  His target LDL is less than 130 because he is male     gender, type A personality and has  a positive family  history.  He      is very physically active with a normal C-reactive protein and LDL      is only up slightly over a year ago.  I am going to give him the      opportunity therefore to continue to work on this through diet and      exercise.  3. Poor sleep hygiene related to anxiety previously.  I think it is      fine for him to use the Xanax as needed, but should not use any      more than two to three per week and if so, will need to reconsider      Paxil, which previously worked but he didn't want to get hooked on      it (which is exactly the concern with over-using Xanax).  Limited      refills were given therefore.  4. Positive family history of colon cancer noted.  However, the      patient had a negative colonoscopy except for diverticulosis      documented on April 20, 2003 and is in the computer for recall in 5      years.  5. Mildly asymmetric prostate, but normal PSA exam.  I believe he has      benign prostatic hypertrophy and unless his urinary symptoms      worsen, I would recommend followup with a yearly exam.  6. Health maintenance:  He received a tetanus in 2006 and Pneumovax in      2007.     Charlaine Dalton. Sherene Sires, MD, [facility]  Electronically Signed    MBW/MedQ  DD: 10/08/2006  DT: 10/09/2006  Job #: [id number]

## 2011-02-10 NOTE — Assessment & Plan Note (Signed)
[facility]                               PULMONARY OFFICE NOTE   NAME:Joshua [name], Joshua [name]                        MRN:          [id number]  DATE:07/09/2006                            DOB:          14-Mar-1946    PRIMARY SEVERE/EXTENDED FOLLOWUP OFFICE EVALUATION   A 65 year old white male in for followup evaluation of hyperlipidemia and  arthritis.  He tells me his arthritis only bothers him when he lifts a lot  of weights.  Mostly in his left shoulder.  He denies any exertional  dyspnea, orthopnea, PND, UTI, or claudication symptoms.  He also has  difficulty with chronic anxiety and insomnia.  He is down on his own to  using Xanax 0.5 at bedtime takes care of both problems.  He denies any  orthopnea, PND, or daytime hypersomnolence, or depression.  He previously  was taking aspirin daily, but said he though I did not want him to take it  because he was on Celebrex, and is also bothered by nocturnal nasal  congestion and drainage at bedtime, for which he was prescribed Nasonex,  but cannot take it because his nose bleeds, and found previously Claritin  did not help.  He denies any nocturnal wheeze or sinus pain, fever, or  purulent secretions, itching, or sneezing, or significant seasonal variation  (note, previously had seasonal variation, now it is a perennial symptom that  really does not change with season).   PHYSICAL EXAMINATION:  He is a slightly anxious but very pleasant healthy-  appearing ambulatory white male in no acute distress.  He has afebrile vital signs, weight 195 pounds.  This is no change from  baseline.  HEENT:  Moderate turbinate edema.  Oropharynx reveals minimal cobblestoning.  No active post-nasal drainage.  NECK:  Supple without cervical adenopathy or tenderness.  Trachea is  midline.  No thyromegaly.  LUNGS:  Fields are perfectly clear bilaterally to auscultation and  percussion.  CARDIAC:  Regular rate and rhythm without  murmur, gallop, or rub.  ABDOMEN:  Soft and benign.  EXTREMITIES:  Warm without calf tenderness, cyanosis, clubbing, or edema.   IMPRESSION:  Extended discussion with this patient lasting 15-20 minutes of  a 25 minute visit on the following topics.  1. Arthritis is relatively well-controlled on Celebrex p.r.n.  However, if      he finds specific exercises aggravate his shoulder, it makes more sense      to eliminate those particular exercises than to take more Celebrex.  I      specifically advised him against pumping iron as a means of      maintaining fitness.  2. Hyperlipidemia.  His last LDL cholesterol was at target, and I have      reviewed this with him in detail from his visit.  Was below target      levels with an LDL of 119 with a normal CRP at that point.  I,      therefore, recommended aerobic, not pumping iron exercise to maintain      general fitness, as well as to  keep our LDL cholesterol down and HDL      up.  3. Chronic rhinitis with nocturnal exacerbation.  I have recommended a      trial of Astelin 2 puffs nightly having failed to tolerate Nasonex and      has not responded to Claritin either.  4. Mild chronic anxiety and insomnia for which Xanax p.r.n. is working      effectively.  5. General health care.  I recommended baby aspirin daily, not p.r.n., and      Pneumovax today because of his chronic sinus, which he agreed to      receive.   Chart review indicates he missed his comprehensive health care evaluation  this year.  We will schedule him for a 3 month followup for this purpose.   I also spent extra time reviewing each and every 1 of his maintenance versus  p.r.n., keeping a copy of his med calendar for the communication section of  the chart, because this patient does not tend to process instructions  accurately, as evidenced by the above concerns.            ______________________________  Joshua Dalton Sherene Sires, MD, [facility]      MBW/MedQ  DD:   07/09/2006  DT:  07/09/2006  Job #:  [id number]

## 2011-02-10 NOTE — Op Note (Signed)
NAME:  Joshua [name], Joshua [name]                           ACCOUNT NO.:  [id number]   MEDICAL RECORD NO.:  [id number]                   PATIENT TYPE:  AMB   LOCATION:  DAY                                  FACILITY:  [facility]   PHYSICIAN:  Ronnell Guadalajara, MD                  DATE OF BIRTH:  05/16/1946   DATE OF PROCEDURE:  07/07/2002  DATE OF DISCHARGE:                                 OPERATIVE REPORT   PREOPERATIVE DIAGNOSIS:  Torn medial meniscus, right knee.   POSTOPERATIVE DIAGNOSIS:  Torn medial meniscus, right knee.   OPERATIVE PROCEDURE:  Partial medial meniscectomy.   SURGEON:  Deloris Ping. Montez Morita, M.D.   DESCRIPTION OF PROCEDURE:  After suitable local anesthesia with heavy  sedation, the knee prepped and draped routinely.  Arthroscopy was carried  out with a lateral parapatellar inflow portal, pressure eventually pending  at 70 but up to 90 mmHg.  Through an anterolateral portal for the scope,  anteromedial for the instrumentation, the medial portal being made under  direct vision with the scope in place, pertinent pathology was confined to  the medial side where there was a large medial plica but did not appear to  be causing any rubbing or irritation on the medial femoral condyle and a  degenerative tear of the posterior horn of the medial meniscus and a very,  very tight, requiring a good bit of pressure to get back to the posterior  portion, basically a split vertical tear with a probable vertical bucket-  handle tear was split; those flaps were removed, and the remaining posterior  portions had a horizontal cleavage, some of which was a bit unstable and was  trimmed out, but the remaining portion that was left was quite stable.  A  large loose piece of posterior horn was finally trimmed down until a small  piece remained that was probed and all felt to be quite stable.  A good bit  of arthritic change in wear over the medial condyle and medial tibial  plateau.  Because of the very  tight knee, there was a little scuffing  created over the medial femoral condyle.  The lateral joint inspected and  quite normal, and the patellofemoral joint with minimal changes.  The ACL  looked quite good.  At the end of the procedure, 20 cc of 0.5% plain  Marcaine into the knee.  The portals were all injected with 2% Xylocaine  with adrenalin.  Nice compression dressing.  He goes to recovery in good  condition.  No tourniquet was used.                                               Ronnell Guadalajara, MD    PC/MEDQ  D:  07/07/2002  T:  07/07/2002  Job:  [id number]

## 2011-06-16 LAB — TYPE AND SCREEN
ABO/RH(D): O POS
Antibody Screen: NEGATIVE

## 2011-06-16 LAB — CBC
MCV: 93.1
Platelets: 253
RBC: 4.84
WBC: 7.1

## 2011-06-16 LAB — ABO/RH: ABO/RH(D): O POS

## 2011-06-16 LAB — BASIC METABOLIC PANEL
BUN: 17
Calcium: 8.6
Chloride: 99
Creatinine, Ser: 1
GFR calc Af Amer: >60

## 2011-07-26 ENCOUNTER — Ambulatory Visit (INDEPENDENT_AMBULATORY_CARE_PROVIDER_SITE_OTHER): Payer: Medicare Other

## 2011-07-26 DIAGNOSIS — Z23 Encounter for immunization: Secondary | ICD-10-CM

## 2011-11-02 ENCOUNTER — Ambulatory Visit
Admission: RE | Admit: 2011-11-02 | Discharge: 2011-11-02 | Disposition: A | Discharge status: Home / Self Care | Payer: Medicare Other | Source: Ambulatory Visit | Attending: Neurosurgery | Admitting: Neurosurgery

## 2011-11-02 ENCOUNTER — Other Ambulatory Visit: Payer: Self-pay | Admitting: Neurosurgery

## 2011-11-02 DIAGNOSIS — M542 Cervicalgia: Secondary | ICD-10-CM

## 2012-01-23 ENCOUNTER — Encounter: Payer: Self-pay | Admitting: Internal Medicine

## 2012-01-24 ENCOUNTER — Encounter: Payer: Self-pay | Admitting: Internal Medicine

## 2012-01-24 ENCOUNTER — Other Ambulatory Visit (INDEPENDENT_AMBULATORY_CARE_PROVIDER_SITE_OTHER): Payer: Medicare Other

## 2012-01-24 ENCOUNTER — Ambulatory Visit (INDEPENDENT_AMBULATORY_CARE_PROVIDER_SITE_OTHER): Payer: Medicare Other | Admitting: Internal Medicine

## 2012-01-24 VITALS — BP 124/74 | HR 54 | Temp 97.7°F | Ht 73.0 in | Wt 191.4 lb

## 2012-01-24 DIAGNOSIS — J310 Chronic rhinitis: Secondary | ICD-10-CM

## 2012-01-24 DIAGNOSIS — E785 Hyperlipidemia, unspecified: Secondary | ICD-10-CM

## 2012-01-24 DIAGNOSIS — M1990 Unspecified osteoarthritis, unspecified site: Secondary | ICD-10-CM

## 2012-01-24 DIAGNOSIS — G540 Brachial plexus disorders: Secondary | ICD-10-CM | POA: Insufficient documentation

## 2012-01-24 LAB — CBC WITH DIFFERENTIAL/PLATELET
Eosinophils Absolute: 0.2 10*3/uL (ref 0.0–0.7)
Lymphocytes Relative: 28.9 % (ref 12.0–46.0)
MCHC: 33.6 g/dL (ref 30.0–36.0)
MCV: 92.3 fl (ref 78.0–100.0)
Monocytes Absolute: 0.6 10*3/uL (ref 0.1–1.0)
Neutrophils Relative %: 55.9 % (ref 43.0–77.0)
Platelets: 227 10*3/uL (ref 150.0–400.0)
WBC: 5.5 10*3/uL (ref 4.5–10.5)

## 2012-01-24 LAB — LDL CHOLESTEROL, DIRECT: Direct LDL: 143.3 mg/dL

## 2012-01-24 LAB — HEPATIC FUNCTION PANEL
Albumin: 4.2 g/dL (ref 3.5–5.2)
Alkaline Phosphatase: 56 U/L (ref 39–117)
Total Bilirubin: 1 mg/dL (ref 0.3–1.2)

## 2012-01-24 LAB — LIPID PANEL
Cholesterol: 203 mg/dL — ABNORMAL HIGH (ref 0–200)
HDL: 52.8 mg/dL (ref >39.00)
Total CHOL/HDL Ratio: 4
VLDL: 9.4 mg/dL (ref 0.0–40.0)

## 2012-01-24 LAB — BASIC METABOLIC PANEL
GFR: 78.43 mL/min (ref >60.00)
Potassium: 4.8 mEq/L (ref 3.5–5.1)
Sodium: 140 mEq/L (ref 135–145)

## 2012-01-24 MED ORDER — TRAZODONE HCL 50 MG PO TABS: ORAL_TABLET | ORAL | Status: DC

## 2012-01-24 NOTE — Patient Instructions (Addendum)
Trazadone 50 mg one half at night every night, take a whole if needed  Ibuprofen 200mg  4 with meals as needed  Please remember to go to the lab department downstairs for your tests - we will call you with the results when they are available.

## 2012-01-24 NOTE — Progress Notes (Deleted)
sdaf

## 2012-01-24 NOTE — Progress Notes (Signed)
Subjective:     Patient ID: Joshua [name]., male   DOB: 09-24-1946   MRN: [id number]  HPI 58 yowm quit smoking mid 20's follwed here for DJD and hyperlipidemia.   October 27, 2008 ov co at bedtime nasal obstruction and sensation of drainage when lies on back but no sign excess mucus indolent onset, persistent x "years and years". No improvement of this co with nasal steroids, abx, decongestants.   August 05, 2009 cpx states nasal symptoms better with saline not using afrin at all now.   December 16, 2009 Acute visit. Pt c/o "croupy cough" x 5 days. He states that cough is prod but unable to bring up any sputum. Started with throat irritation, water rhinitis that turned yellow. rx with doxy and short term gerd rx   November 10, 2010 cpx with chronic [facility] disorder (? [facility]) and nasal stuffiness with overuse of afrin and underuse of nasonex. no purulent nasal secretions.   01/24/2012 f/u ov/Joshua [name] cc indolent onset gradually worsening R shoulder and radicular pattern R hand tingling  C8 distribution x 1 year, not better on celebrex, followed by NS.  No weakness in grip but some numbness exp hand. Unable to sleep well due to pain with freq awakening   Sleeping ok without nocturnal  or early am exacerbation  of respiratory  c/o's or need for noct saba. Also denies any obvious fluctuation of symptoms with weather or environmental changes or other aggravating or alleviating factors except as outlined above   ROS  At present neg for  any significant sore throat, dysphagia, dental problems, itching, sneezing,  nasal congestion or excess/ purulent secretions, ear ache,   fever, chills, sweats, unintended wt loss, pleuritic or exertional cp, hemoptysis, palpitations, orthopnea pnd or leg swelling.  Also denies presyncope, palpitations, heartburn, abdominal pain, anorexia, nausea, vomiting, diarrhea  or change in bowel or urinary habits, change in stools or urine, dysuria,hematuria,  rash, arthralgias,  visual complaints, headache,   ataxia or problems with walking or coordination. No noted change in mood/affect or memory.                    Past Medical History:  HYPERLIPIDEMIA (ICD-272.4)  - Target LDL < 130 male gender, pos fm hx  DEGENERATIVE JOINT DISEASE (ICD-715.90)  DIVERTICULOSIS OF COLON (ICD-562.10)........................................Marland KitchenJarold [name]  - Colonoscopy 04/20/03 and 02/2008  CHRONIC RHINITIS (ICD-472.0)  ANXIETY, CHRONIC (ICD-300.00) > PTSD per [facility]  HEADACHE (ICD-784.0)  Hx of VERTIGO (ICD-780.4)  GERD  - Pos EGD 1991 for GERD with 3-4 cm HH  Health Maintenance............................................................Marland KitchenWert  - DT 11/2004  - Pneumovax November 10, 2010  - CPX 01/24/2012    Family History:  heart disease in father age 60's  colon cancer paternal side uncle and aunt  anxiety in sister/ hypochondriac per pt    Social History:  Patient states former smoker, quit around 74  [employer] Radio broadcast assistant- retired  Works out at [employer] and volunteers at [employer]      Review of Systems     Objective:   Physical Exam  wt 195 > 197 October 27, 2008 > 194 August 05, 2009 > 189 December 16, 2009 > 192 November 10, 2010  >  01/24/2012  191 GEN: A/Ox3; pleasant , NAD  HEENT: [place]/AT, , EACs-clear, TMs-wnl, NOSE-pale, nontender sinus , THROAT-clear  NECK: Supple w/ fair ROM; no JVD; normal carotid impulses w/o bruits; no thyromegaly or nodules palpated; no lymphadenopathy.  CHEST: Clear  to P & A; w/o, wheezes/ rales/ or rhonchi.  HEART: RRR, no m/r/g  ABDOMEN: Soft & nt; nml bowel sounds; no organomegaly or masses detected.  EXT: Warm bil, no calf pain, edema, clubbing, pulses intact  GU uncircum testes down bilaterally no IH or nodules  Rectal mod ext hemorrhoids/ nl prostate/ stool g neg  MS nl gait and station, no obvious joint restriction/ swelling    Assessment:          Plan:

## 2012-01-25 NOTE — Assessment & Plan Note (Signed)
-   Ideal LDL < 130  Not at goal but note hdl is relatively healthy. No change in rx

## 2012-01-25 NOTE — Assessment & Plan Note (Addendum)
?   Radicular patter R C 8 ?  s affecting arm strength or grip Try adding trazadone at hs and prn advil, f/u NS ? Will need pain clinic ?

## 2012-03-22 ENCOUNTER — Telehealth: Payer: Self-pay | Admitting: Internal Medicine

## 2012-03-22 MED ORDER — MELOXICAM 7.5 MG PO TABS: 7.5000 mg | ORAL_TABLET | Freq: Every day | ORAL | Status: DC | PRN

## 2012-03-22 NOTE — Telephone Encounter (Signed)
Ok meloxicam 7.5 mg daily prn (not daily but only when his arthritis is acting up) take with meals  Ok to give 90 but needs ov before any further refills to monitor his response/ renal fxn

## 2012-03-22 NOTE — Telephone Encounter (Signed)
Spoke with pt. He states that he was given rx for meloxicam at the [place] and this works well for his pain and has noticed less stomach upset than the advil causes. He is taking 15 mg once daily. Would [facility] local [facility] day supply. Please advise thanks!

## 2012-03-22 NOTE — Telephone Encounter (Signed)
Rx was sent to pharm. Left detailed msg for the pt to be made aware that this was done and will need ov before rx runs out.

## 2013-01-22 ENCOUNTER — Encounter: Payer: Self-pay | Admitting: Gastroenterology

## 2013-02-04 ENCOUNTER — Encounter: Payer: Self-pay | Admitting: Gastroenterology

## 2013-03-04 ENCOUNTER — Encounter: Payer: Self-pay | Admitting: Internal Medicine

## 2013-03-04 ENCOUNTER — Other Ambulatory Visit (INDEPENDENT_AMBULATORY_CARE_PROVIDER_SITE_OTHER): Payer: Medicare Other

## 2013-03-04 ENCOUNTER — Ambulatory Visit (INDEPENDENT_AMBULATORY_CARE_PROVIDER_SITE_OTHER): Payer: Medicare Other | Admitting: Internal Medicine

## 2013-03-04 ENCOUNTER — Ambulatory Visit (INDEPENDENT_AMBULATORY_CARE_PROVIDER_SITE_OTHER)
Admission: RE | Admit: 2013-03-04 | Discharge: 2013-03-04 | Disposition: A | Discharge status: Home / Self Care | Payer: Medicare Other | Source: Ambulatory Visit | Attending: Internal Medicine | Admitting: Internal Medicine

## 2013-03-04 ENCOUNTER — Ambulatory Visit (AMBULATORY_SURGERY_CENTER): Payer: Medicare Other

## 2013-03-04 VITALS — Ht 73.0 in | Wt 192.2 lb

## 2013-03-04 VITALS — BP 130/80 | HR 58 | Temp 97.7°F | Ht 73.25 in | Wt 192.0 lb

## 2013-03-04 DIAGNOSIS — Z1211 Encounter for screening for malignant neoplasm of colon: Secondary | ICD-10-CM

## 2013-03-04 DIAGNOSIS — M1990 Unspecified osteoarthritis, unspecified site: Secondary | ICD-10-CM

## 2013-03-04 DIAGNOSIS — J310 Chronic rhinitis: Secondary | ICD-10-CM

## 2013-03-04 DIAGNOSIS — E785 Hyperlipidemia, unspecified: Secondary | ICD-10-CM

## 2013-03-04 DIAGNOSIS — R059 Cough, unspecified: Secondary | ICD-10-CM

## 2013-03-04 DIAGNOSIS — R05 Cough: Secondary | ICD-10-CM

## 2013-03-04 DIAGNOSIS — Z800 Family history of malignant neoplasm of digestive organs: Secondary | ICD-10-CM

## 2013-03-04 DIAGNOSIS — G540 Brachial plexus disorders: Secondary | ICD-10-CM

## 2013-03-04 LAB — HEPATIC FUNCTION PANEL
Albumin: 4.1 g/dL (ref 3.5–5.2)
Alkaline Phosphatase: 58 U/L (ref 39–117)
Bilirubin, Direct: 0.2 mg/dL (ref 0.0–0.3)
Total Protein: 7.8 g/dL (ref 6.0–8.3)

## 2013-03-04 LAB — CBC WITH DIFFERENTIAL/PLATELET
Basophils Absolute: 0 10*3/uL (ref 0.0–0.1)
Basophils Relative: 0.7 % (ref 0.0–3.0)
Eosinophils Absolute: 0.3 10*3/uL (ref 0.0–0.7)
Lymphocytes Relative: 27.8 % (ref 12.0–46.0)
MCHC: 33.9 g/dL (ref 30.0–36.0)
MCV: 92.9 fl (ref 78.0–100.0)
Monocytes Absolute: 0.6 10*3/uL (ref 0.1–1.0)
Neutrophils Relative %: 54.1 % (ref 43.0–77.0)
RBC: 4.86 Mil/uL (ref 4.22–5.81)
RDW: 14.2 % (ref 11.5–14.6)

## 2013-03-04 LAB — BASIC METABOLIC PANEL
BUN: 20 mg/dL (ref 6–23)
CO2: 19 mEq/L (ref 19–32)
Calcium: 9.1 mg/dL (ref 8.4–10.5)
Chloride: 108 mEq/L (ref 96–112)
Creatinine, Ser: 1.2 mg/dL (ref 0.4–1.5)

## 2013-03-04 LAB — LIPID PANEL
Cholesterol: 189 mg/dL (ref 0–200)
HDL: 46.1 mg/dL (ref >39.00)
LDL Cholesterol: 133 mg/dL — ABNORMAL HIGH (ref 0–99)
Total CHOL/HDL Ratio: 4
Triglycerides: 52 mg/dL (ref 0.0–149.0)

## 2013-03-04 MED ORDER — CELECOXIB 200 MG PO CAPS: 200.0000 mg | ORAL_CAPSULE | Freq: Two times a day (BID) | ORAL | Status: DC

## 2013-03-04 MED ORDER — MOVIPREP 100 G PO SOLR: ORAL | Status: DC

## 2013-03-04 NOTE — Assessment & Plan Note (Signed)
Doubt celebrex will do better than meloxicam but worth trying> back to PT next

## 2013-03-04 NOTE — Assessment & Plan Note (Addendum)
Adequate control on present rx, reviewed zyrtec is prn, not maint - if not effective add dymista maint next

## 2013-03-04 NOTE — Patient Instructions (Addendum)
Ok to try celebrex 200 mg up to twice daily meals as needed for shoulder pain if you find it helps > if not, return to physical therapy  Zyrtec 10 mg one at bedtime as needed for itching sneezing water eyes and call if not effective for trial of dymista (would be a daily maintenance med)  Please remember to go to the lab and x-ray department downstairs for your tests - we will call you with the results when they are available.  Return yearly for comprehensive evaluation, sooner as needed

## 2013-03-04 NOTE — Progress Notes (Signed)
Subjective:     Patient ID: Joshua [name]., male   DOB: [birth year]   MRN: [id number]  HPI 66 yowm quit smoking mid 20's follwed here for DJD and hyperlipidemia.   October 27, 2008 ov co at bedtime nasal obstruction and sensation of drainage when lies on back but no sign excess mucus indolent onset, persistent x "years and years". No improvement of this co with nasal steroids, abx, decongestants.   August 05, 2009 cpx states nasal symptoms better with saline not using afrin at all now.   December 16, 2009 Acute visit. Pt c/o "croupy cough" x 5 days. He states that cough is prod but unable to bring up any sputum. Started with throat irritation, water rhinitis that turned yellow. rx with doxy and short term gerd rx   November 10, 2010 cpx with chronic [facility] disorder (? [facility]) and nasal stuffiness with overuse of afrin and underuse of nasonex. no purulent nasal secretions.   01/24/2012 f/u ov/Joshua [name] cc indolent onset gradually worsening R shoulder and radicular pattern R hand tingling  C8 distribution x 1 year, not better on celebrex, followed by NS.  No weakness in grip but some numbness exp hand. Unable to sleep well due to pain with freq awakening > rec NS > PT for TOS   03/04/2013 f/u ov/Joshua [name] comprehensive eval for shoulder pains/ intermittent rhinitis Chief Complaint  Patient presents with  . Annual Exam    Pt doing well and denies any co's today  re R shoulder pain: no longer responding to meloxicam and wants to try celebrex Confused with how to use zyrtec/ has sensation pnds assoc with occ dry cough/ mostly seasonal spring > fall No sob, cp, very active at the y doing aerobics s difficulty.   No obvious daytime variabilty or assoc chronic cough or cp or chest tightness, subjective wheeze overt sinus or hb symptoms. No unusual exp hx or h/o childhood pna/ asthma or premature birth to his knowledge.    Sleeping ok without nocturnal  or early am exacerbation  of respiratory  c/o's or  need for noct saba. Also denies any obvious fluctuation of symptoms with weather or environmental changes or other aggravating or alleviating factors except as outlined above   ROS  At present neg for  any significant sore throat, dysphagia, dental problems, itching, sneezing,  nasal congestion or excess/ purulent secretions, ear ache,   fever, chills, sweats, unintended wt loss, pleuritic or exertional cp, hemoptysis, palpitations, orthopnea pnd or leg swelling.  Also denies presyncope, palpitations, heartburn, abdominal pain, anorexia, nausea, vomiting, diarrhea  or change in bowel or urinary habits, change in stools or urine, dysuria,hematuria,  rash, arthralgias, visual complaints, headache,   ataxia or problems with walking or coordination. No noted change in mood/affect or memory.                    Past Medical History:  HYPERLIPIDEMIA (ICD-272.4)  - Target LDL < 130 male gender, pos fm hx  DEGENERATIVE JOINT DISEASE (ICD-715.90)  DIVERTICULOSIS OF COLON (ICD-562.10)........................................Marland KitchenJarold [name]  - Colonoscopy 04/20/03 and 02/2008  CHRONIC RHINITIS (ICD-472.0)  ANXIETY, CHRONIC (ICD-300.00) > [facility]  HEADACHE (ICD-784.0)  Hx of VERTIGO (ICD-780.4)  GERD  - Pos EGD 1991 for GERD with 3-4 cm HH  Health Maintenance............................................................Marland KitchenWert  - DT 11/2004  - Pneumovax November 10, 2010  - CPX 03/04/2013    Family History:  heart disease in father age 73's  colon cancer paternal side uncle  and aunt  anxiety in sister/ hypochondriac per pt  No prostate ca   Social History:  Patient states former smoker, quit around 53  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  CHEST: Clear to P & A; w/o, wheezes/ rales/ or rhonchi.  HEART: RRR, no m/r/g  ABDOMEN: Soft & nt; nml bowel sounds; no organomegaly or masses detected.  EXT: Warm bil, no calf pain, edema, clubbing, pulses intact  GU /rectal per Joshua [name] scheduled for 03/05/13 MS nl gait and station, no obvious joint restriction/ swelling Skin no lesions  CXR  03/04/2013 :  Hyperinflation configuration consistent with element of COPD. Minimal central peribronchial thickening. This may be associated with bronchitis, asthma, and reactive airway disease.      Assessment:

## 2013-03-05 ENCOUNTER — Encounter: Payer: Self-pay | Admitting: Gastroenterology

## 2013-03-05 ENCOUNTER — Telehealth: Payer: Self-pay | Admitting: Internal Medicine

## 2013-03-05 NOTE — Assessment & Plan Note (Signed)
Classic Upper airway cough syndrome, so named because it's frequently impossible to sort out how much is  CR/sinusitis with freq throat clearing (which can be related to primary GERD)   vs  causing  secondary (" extra esophageal")  GERD from wide swings in gastric pressure that occur with throat clearing, often  promoting self use of mint and menthol lozenges that reduce the lower esophageal sphincter tone and exacerbate the problem further in a cyclical fashion.   These are the same pts (now being labeled as having "irritable larynx syndrome" by some cough centers) who not infrequently have a history of having failed to tolerate ace inhibitors,  dry powder inhalers or biphosphonates or report having atypical reflux symptoms that don't respond to standard doses of PPI , and are easily confused as having aecopd or asthma flares by even experienced allergists/ pulmonologists.   For now just rx with zyrtec prn

## 2013-03-05 NOTE — Telephone Encounter (Signed)
Notes Recorded by Nyoka Cowden, MD on 03/04/2013 at 5:14 PM Call pt: Reviewed cxr and no acute change so no change in recommendations made at ov - there is no clinical evidence of copd so nothing else needs to be done here   I spoke with patient about results and he verbalized understanding and had no questions

## 2013-03-05 NOTE — Assessment & Plan Note (Signed)
-   Ideal LDL < 130 former smoker, Type A, father with IHD in 31s  Lab Results  Component Value Date   CHOL 189 03/04/2013   HDL 46.10 03/04/2013   LDLCALC 133* 03/04/2013   LDLDIRECT 143.3 01/24/2012   TRIG 52.0 03/04/2013   CHOLHDL 4 03/04/2013    Adequate control on present rx, reviewed

## 2013-03-17 ENCOUNTER — Encounter: Payer: Medicare Other | Admitting: Gastroenterology

## 2013-04-02 ENCOUNTER — Ambulatory Visit (AMBULATORY_SURGERY_CENTER): Payer: Medicare Other | Admitting: Gastroenterology

## 2013-04-02 ENCOUNTER — Encounter: Payer: Self-pay | Admitting: Gastroenterology

## 2013-04-02 VITALS — BP 123/88 | HR 60 | Temp 97.4°F | Resp 16 | Ht 73.0 in | Wt 192.0 lb

## 2013-04-02 DIAGNOSIS — K5730 Diverticulosis of large intestine without perforation or abscess without bleeding: Secondary | ICD-10-CM

## 2013-04-02 DIAGNOSIS — Z800 Family history of malignant neoplasm of digestive organs: Secondary | ICD-10-CM

## 2013-04-02 DIAGNOSIS — Z1211 Encounter for screening for malignant neoplasm of colon: Secondary | ICD-10-CM

## 2013-04-02 MED ORDER — SODIUM CHLORIDE 0.9 % IV SOLN: 500.0000 mL | INTRAVENOUS | Status: DC

## 2013-04-02 NOTE — Patient Instructions (Addendum)
Discharge instructions given with verbal understanding. Handouts on diverticulosis and hemorrhoids given. Resume previous medications. YOU HAD AN ENDOSCOPIC PROCEDURE TODAY AT THE  ENDOSCOPY CENTER: Refer to the procedure report that was given to you for any specific questions about what was found during the examination.  If the procedure report does not answer your questions, please call your gastroenterologist to clarify.  If you requested that your care partner not be given the details of your procedure findings, then the procedure report has been included in a sealed envelope for you to review at your convenience later.  YOU SHOULD EXPECT: Some feelings of bloating in the abdomen. Passage of more gas than usual.  Walking can help get rid of the air that was put into your GI tract during the procedure and reduce the bloating. If you had a lower endoscopy (such as a colonoscopy or flexible sigmoidoscopy) you may notice spotting of blood in your stool or on the toilet paper. If you underwent a bowel prep for your procedure, then you may not have a normal bowel movement for a few days.  DIET: Your first meal following the procedure should be a light meal and then it is ok to progress to your normal diet.  A half-sandwich or bowl of soup is an example of a good first meal.  Heavy or fried foods are harder to digest and may make you feel nauseous or bloated.  Likewise meals heavy in dairy and vegetables can cause extra gas to form and this can also increase the bloating.  Drink plenty of fluids but you should avoid alcoholic beverages for 24 hours.  ACTIVITY: Your care partner should take you home directly after the procedure.  You should plan to take it easy, moving slowly for the rest of the day.  You can resume normal activity the day after the procedure however you should NOT DRIVE or use heavy machinery for 24 hours (because of the sedation medicines used during the test).    SYMPTOMS TO REPORT  IMMEDIATELY: A gastroenterologist can be reached at any hour.  During normal business hours, 8:30 AM to 5:00 PM Monday through Friday, call [phone].  After hours and on weekends, please call the GI answering service at [phone] who will take a message and have the physician on call contact you.   Following lower endoscopy (colonoscopy or flexible sigmoidoscopy):  Excessive amounts of blood in the stool  Significant tenderness or worsening of abdominal pains  Swelling of the abdomen that is new, acute  Fever of 100F or higher  FOLLOW UP: If any biopsies were taken you will be contacted by phone or by letter within the next 1-3 weeks.  Call your gastroenterologist if you have not heard about the biopsies in 3 weeks.  Our staff will call the home number listed on your records the next business day following your procedure to check on you and address any questions or concerns that you may have at that time regarding the information given to you following your procedure. This is a courtesy call and so if there is no answer at the home number and we have not heard from you through the emergency physician on call, we will assume that you have returned to your regular daily activities without incident.  SIGNATURES/CONFIDENTIALITY: You and/or your care partner have signed paperwork which will be entered into your electronic medical record.  These signatures attest to the fact that that the information above on your After Visit   Summary has been reviewed and is understood.  Full responsibility of the confidentiality of this discharge information lies with you and/or your care-partner. 

## 2013-04-02 NOTE — Progress Notes (Signed)
Patient did not experience any of the following events: a burn prior to discharge; a fall within the facility; wrong site/side/patient/procedure/implant event; or a hospital transfer or hospital admission upon discharge from the facility. (G8907) Patient did not have preoperative order for IV antibiotic SSI prophylaxis. (G8918)  

## 2013-04-02 NOTE — Op Note (Signed)
[facility] [address] [place], [address]   COLONOSCOPY PROCEDURE REPORT  PATIENT: Joshua [name], Joshua [name]  MR#: [id number] BIRTHDATE: July 11, 1946 , 66  yrs. old GENDER: Male ENDOSCOPIST: Mardella Layman, MD, Clementeen Graham REFERRED BY:  Nyoka Cowden, M.D. PROCEDURE DATE:  04/02/2013 PROCEDURE:   Colonoscopy, screening ASA CLASS:   Class II INDICATIONS: MEDICATIONS: propofol (Diprivan) 100mg  IV  DESCRIPTION OF PROCEDURE:   After the risks and benefits and of the procedure were explained, informed consent was obtained.  A digital rectal exam revealed no abnormalities of the rectum.    The LB [id number]  endoscope was introduced through the anus and advanced to the cecum, which was identified by both the appendix and ileocecal valve .  The quality of the prep was excellent, using MoviPrep .  The instrument was then slowly withdrawn as the colon was fully examined.     COLON FINDINGS: There was moderate diverticulosis noted in the descending colon and sigmoid colon with associated muscular hypertrophy and petechiae.   Large external hemorrhoids were found. A normal appearing cecum, ileocecal valve, and appendiceal orifice were identified.  The ascending, hepatic flexure, transverse, splenic flexure, descending, sigmoid colon and rectum appeared unremarkable.  No polyps or cancers were seen. Retroflexed views revealed external hemorrhoids.     The scope was then withdrawn from the patient and the procedure completed.  COMPLICATIONS: There were no complications. ENDOSCOPIC IMPRESSION: 1.   There was moderate diverticulosis noted in the descending colon and sigmoid colon 2.   Large external hemorrhoids 3.   Normal colon ...no polyps noted.  RECOMMENDATIONS: 1.  Continue current medications 2.  High fiber diet with liberal fluid intake. 3.  Given your age, you will not need another colonoscopy for colon cancer screening or polyp surveillance.  These types of  tests usually stop around the age 50.   REPEAT EXAM:  cc:  _______________________________ eSignedMardella Layman, MD, [facility] 04/02/2013 11:31 AM     PATIENT NAME:  Joshua [name] MR#: [id number]

## 2013-04-02 NOTE — Progress Notes (Signed)
Lidocaine-40mg IV prior to Propofol InductionPropofol given over incremental dosages 

## 2013-04-03 ENCOUNTER — Telehealth: Payer: Self-pay

## 2013-04-03 NOTE — Telephone Encounter (Signed)
Left message on answering machine. 

## 2013-07-08 ENCOUNTER — Ambulatory Visit (INDEPENDENT_AMBULATORY_CARE_PROVIDER_SITE_OTHER): Payer: Medicare Other

## 2013-07-08 DIAGNOSIS — Z23 Encounter for immunization: Secondary | ICD-10-CM

## 2013-10-01 ENCOUNTER — Encounter: Payer: Self-pay | Admitting: Internal Medicine

## 2013-10-01 ENCOUNTER — Ambulatory Visit (INDEPENDENT_AMBULATORY_CARE_PROVIDER_SITE_OTHER): Payer: Medicare Other | Admitting: Internal Medicine

## 2013-10-01 VITALS — BP 160/100 | HR 63 | Temp 97.8°F | Ht 73.0 in | Wt 198.6 lb

## 2013-10-01 DIAGNOSIS — I10 Essential (primary) hypertension: Secondary | ICD-10-CM

## 2013-10-01 DIAGNOSIS — J069 Acute upper respiratory infection, unspecified: Secondary | ICD-10-CM

## 2013-10-01 DIAGNOSIS — R51 Headache: Secondary | ICD-10-CM

## 2013-10-01 MED ORDER — AMOXICILLIN-POT CLAVULANATE 875-125 MG PO TABS: 1.0000 | ORAL_TABLET | Freq: Two times a day (BID) | ORAL | Status: DC

## 2013-10-01 MED ORDER — PREDNISONE 10 MG PO TABS: ORAL_TABLET | ORAL | Status: DC

## 2013-10-01 MED ORDER — PHENYLEPH-PROMETHAZINE-COD 5-6.25-10 MG/5ML PO SYRP: ORAL_SOLUTION | ORAL | Status: DC

## 2013-10-01 NOTE — Progress Notes (Signed)
Subjective:     Patient ID: Joshua Paradiso., male   DOB: August 01, 1946   MRN: [id number]   Brief patient profile:  14 yowm quit smoking mid 20's follwed here for DJD and hyperlipidemia.   History of Present Illness  03/04/2013 f/u ov/Joshua [name] comprehensive eval for shoulder pains/ intermittent rhinitis Chief Complaint  Patient presents with  . Annual Exam    Pt doing well and denies any co's today  re R shoulder pain: no longer responding to meloxicam and wants to try celebrex Confused with how to use zyrtec/ has sensation pnds assoc with occ dry cough/ mostly seasonal spring > fall rec Ok to try celebrex 200 mg up to twice daily meals as needed for shoulder pain if you find it helps > if not, return to physical therapy Zyrtec 10 mg one at bedtime as needed for itching sneezing water eyes and call if not effective for trial of dymista (would be a daily maintenance med)   10/01/2013 acute  ov/Joshua [name] re: uri/ rhinitis Chief Complaint  Patient presents with  . Acute Visit    Cough with yellow mucus, headaches x2 weeks and worsening   frontal sinus pain worse as day goes on  No sob, rigors.    No obvious daytime variabilty or assoc chronic  cp or chest tightness, subjective wheeze overt sinus or hb symptoms. No unusual exp hx or h/o childhood pna/ asthma or premature birth to his knowledge.    Sleeping ok without nocturnal  or early am exacerbation  of respiratory  c/o's or need for noct saba. Also denies any obvious fluctuation of symptoms with weather or environmental changes or other aggravating or alleviating factors except as outlined above   ROS  At present neg for  any significant sore throat, dysphagia, dental problems, itching, sneezing,  nasal congestion or excess/ purulent secretions, ear ache,   fever, chills, sweats, unintended wt loss, pleuritic or exertional cp, hemoptysis, palpitations, orthopnea pnd or leg swelling.  Also denies presyncope, palpitations, heartburn, abdominal pain,  anorexia, nausea, vomiting, diarrhea  or change in bowel or urinary habits, change in stools or urine, dysuria,hematuria,  rash, arthralgias, visual complaints, headache,   ataxia or problems with walking or coordination. No noted change in mood/affect or memory.                    Past Medical History:  HYPERLIPIDEMIA (ICD-272.4)  - Target LDL < 130 male gender, pos fm hx  DEGENERATIVE JOINT DISEASE (ICD-715.90)  DIVERTICULOSIS OF COLON (ICD-562.10)........................................Marland KitchenSharlett Iles  - Colonoscopy 04/20/03 and 02/2008  CHRONIC RHINITIS (ICD-472.0)  ANXIETY, CHRONIC (ICD-300.00) > [facility]  HEADACHE (ICD-784.0)  Hx of VERTIGO (ICD-780.4)  GERD  - Pos EGD 1991 for GERD with 3-4 cm HH  Health Maintenance............................................................Marland KitchenWert  - DT 11/2004  - Pneumovax November 10, 2010  - CPX 03/04/2013    Family History:  heart disease in father age 74's  colon cancer paternal side uncle and aunt  anxiety in sister/ hypochondriac per pt  No prostate ca   Social History:  Patient states former smoker, quit around [place]- retired  Works out at [employer] and volunteers at [employer]            Objective:   Physical Exam  wt 195 > 197 October 27, 2008 > 194 August 05, 2009 > 189 December 16, 2009 > 192 November 10, 2010  >  01/24/2012  191> 03/04/2013  192>  198 10/01/13  GEN: A/Ox3; pleasant , NAD  HEENT: [place]/AT, , EACs-clear, TMs-wnl, NOSE-pale, nontender sinus , THROAT-clear  NECK: Supple w/ fair ROM; no JVD; normal carotid impulses w/o bruits; no thyromegaly or nodules palpated; no lymphadenopathy.  CHEST: Clear to P & A; w/o, wheezes/ rales/ or rhonchi.  HEART: RRR, no m/r/g  ABDOMEN: Soft & nt; nml bowel sounds; no organomegaly or masses detected.  EXT: Warm bil, no calf pain, edema, clubbing, pulses intact   MS nl gait and station, no obvious joint restriction/ swelling Skin no  lesions    CXR  03/04/2013 :  Hyperinflation configuration consistent with element of COPD. Minimal central peribronchial thickening. This may be associated with bronchitis, asthma, and reactive airway disease.      Assessment:

## 2013-10-01 NOTE — Patient Instructions (Addendum)
Augmentin 875 mg take one pill twice daily  X 10 days - take at breakfast and supper with large glass of water.  It would help reduce the usual side effects (diarrhea and yeast infections) if you ate cultured yogurt at lunch.   Prednisone 10 mg take  4 each am x 2 days,   2 each am x 2 days,  1 each am x 2 days and stop  For cough or headache take phenergan vc with codeine 2 tsp every 4 hours if needed  Avoid salt and no advil cold and sinus and minimize the celebrex  Please schedule a follow up office visit in 2  weeks, sooner if needed to see Tammy for recheck and also check your blood pressure before your trip

## 2013-10-03 DIAGNOSIS — I10 Essential (primary) hypertension: Secondary | ICD-10-CM | POA: Insufficient documentation

## 2013-10-03 NOTE — Assessment & Plan Note (Signed)
prob a tension ha from coughing > rx phenergan vc with codeine

## 2013-10-03 NOTE — Assessment & Plan Note (Signed)
?   Complicated by early sinusitis/ bronchitis > rx augmentin and Prednisone 10 mg take  4 each am x 2 days,   2 each am x 2 days,  1 each am x 2 days and stop

## 2013-10-03 NOTE — Assessment & Plan Note (Signed)
In setting of acute illness with use of nsaids and sudaphed  rec conservative rx , no meds, f/u in 2 wks

## 2013-10-16 ENCOUNTER — Encounter: Payer: Self-pay | Admitting: Adult Health

## 2013-10-16 ENCOUNTER — Ambulatory Visit (INDEPENDENT_AMBULATORY_CARE_PROVIDER_SITE_OTHER): Payer: Medicare Other | Admitting: Adult Health

## 2013-10-16 VITALS — BP 126/84 | HR 58 | Temp 98.2°F | Ht 73.0 in | Wt 193.4 lb

## 2013-10-16 DIAGNOSIS — J069 Acute upper respiratory infection, unspecified: Secondary | ICD-10-CM

## 2013-10-16 MED ORDER — PHENYLEPH-PROMETHAZINE-COD 5-6.25-10 MG/5ML PO SYRP: ORAL_SOLUTION | ORAL | Status: DC

## 2013-10-16 NOTE — Patient Instructions (Signed)
Saline nasal rinses As needed   Claritin daily As needed  Drainage  Have fun on your trip  Follow up as planned and As needed

## 2013-10-16 NOTE — Assessment & Plan Note (Signed)
Recent flare with sinusitis, now resolved. Patient's continue on current regimen. May use Claritin as needed along with saline nasal rinses

## 2013-10-16 NOTE — Progress Notes (Signed)
Subjective:     Patient ID: Joshua [name]., male   DOB: January 05, 1946   MRN: [id number]   Brief patient profile:  68 yowm quit smoking mid 20's follwed here for DJD and hyperlipidemia.   History of Present Illness  03/04/2013 f/u ov/Joshua [name] comprehensive eval for shoulder pains/ intermittent rhinitis Chief Complaint  Patient presents with  . Annual Exam    Pt doing well and denies any co's today  re R shoulder pain: no longer responding to meloxicam and wants to try celebrex Confused with how to use zyrtec/ has sensation pnds assoc with occ dry cough/ mostly seasonal spring > fall rec Ok to try celebrex 200 mg up to twice daily meals as needed for shoulder pain if you find it helps > if not, return to physical therapy Zyrtec 10 mg one at bedtime as needed for itching sneezing water eyes and call if not effective for trial of dymista (would be a daily maintenance med)   10/01/2013 acute  ov/Joshua [name] re: uri/ rhinitis Chief Complaint  Patient presents with  . Acute Visit    Cough with yellow mucus, headaches x2 weeks and worsening   frontal sinus pain worse as day goes on  No sob, rigors.  Augmentin and pred pack   10/16/2013 Follow up  Returns for 2 week follow up for Sinusitis . Tx w/ augmentin and pred pack. pt reports symptoms are improved since last ov, is back to baseline.  baseline does include itchy/irritated/dry eyes, sinus pressure, PND We discussed using claritin As needed   Blood pressure was elevated. Last visit, however, was returned back to normal. He denies any hemoptysis, chest pain, orthopnea, PND, or leg swelling. Patient is leaving on vacation in 2 weeks.    ROS  At present neg for  any significant sore throat, dysphagia, dental problems, itching, sneezing,  nasal congestion or excess/ purulent secretions, ear ache,   fever, chills, sweats, unintended wt loss, pleuritic or exertional cp, hemoptysis, palpitations, orthopnea pnd or leg swelling.  Also denies presyncope,  palpitations, heartburn, abdominal pain, anorexia, nausea, vomiting, diarrhea  or change in bowel or urinary habits, change in stools or urine, dysuria,hematuria,  rash,  visual complaints, headache,   ataxia or problems with walking or coordination. No noted change in mood/affect or memory.                    Past Medical History:  HYPERLIPIDEMIA (ICD-272.4)  - Target LDL < 130 male gender, pos fm hx  DEGENERATIVE JOINT DISEASE (ICD-715.90)  DIVERTICULOSIS OF COLON (ICD-562.10)........................................Marland KitchenSharlett [name]  - Colonoscopy 04/20/03 and 02/2008  CHRONIC RHINITIS (ICD-472.0)  ANXIETY, CHRONIC (ICD-300.00) > [facility]  HEADACHE (ICD-784.0)  Hx of VERTIGO (ICD-780.4)  GERD  - Pos EGD 1991 for GERD with 3-4 cm HH  Health Maintenance............................................................Marland KitchenWert  - DT 11/2004  - Pneumovax November 10, 2010  - CPX 03/04/2013    Family History:  heart disease in father age 69's  colon cancer paternal side uncle and aunt  anxiety in sister/ hypochondriac per pt  No prostate ca   Social History:  Patient states former smoker, quit around [place]- retired  Works out at [employer] and volunteers at [employer]            Objective:   Physical Exam  wt 195 > 197 October 27, 2008 > 194 August 05, 2009 > 189 December 16, 2009 > 192 November 10, 2010  >  01/24/2012  191> 03/04/2013  192>  198 10/01/13  GEN: A/Ox3; pleasant , NAD  HEENT: [place]/AT, , EACs-clear, TMs-wnl, NOSE-pale, nontender sinus , THROAT-clear  NECK: Supple w/ fair ROM; no JVD; normal carotid impulses w/o bruits; no thyromegaly or nodules palpated; no lymphadenopathy.  CHEST: Clear to P & A; w/o, wheezes/ rales/ or rhonchi.  HEART: RRR, no m/r/g  ABDOMEN: Soft & nt; nml bowel sounds; no organomegaly or masses detected.  EXT: Warm bil, no calf pain, edema, clubbing, pulses intact   MS nl gait and station, no obvious joint restriction/  swelling Skin no lesions    CXR  03/04/2013 :  Hyperinflation configuration consistent with element of COPD. Minimal central peribronchial thickening. This may be associated with bronchitis, asthma, and reactive airway disease.      Assessment:

## 2014-05-13 ENCOUNTER — Encounter: Payer: Self-pay | Admitting: Gastroenterology

## 2014-06-09 ENCOUNTER — Telehealth: Payer: Self-pay | Admitting: Internal Medicine

## 2014-06-09 NOTE — Telephone Encounter (Signed)
Needs appt  Last seen in Jan  LMTCB

## 2014-06-10 NOTE — Telephone Encounter (Signed)
Spoke with pt - pt needs surgery clearance for right knee replacement with Dr. Alvan Dame scheduled for Jul 21, 2014. Form was faxed from Dr. Aurea Graff office for surgery clearance and has been completed by Dr. Melvyn Novas with the following recs:  "Patient is cleared for surgery with the following recommendations of: no restrictions."  I faxed form to [facility] with Antionette Char at [phone] and placed form in scan folder.  Pt aware, was very appreciative, and voiced no further questions or concerns at this time.

## 2014-06-10 NOTE — Telephone Encounter (Signed)
Pt returning call a/b surgery clearance.Joshua [name]

## 2014-07-10 ENCOUNTER — Encounter (HOSPITAL_COMMUNITY): Payer: Self-pay | Admitting: Pharmacy Technician

## 2014-07-15 ENCOUNTER — Encounter (HOSPITAL_COMMUNITY)
Admission: RE | Admit: 2014-07-15 | Discharge: 2014-07-15 | Disposition: A | Discharge status: Home / Self Care | Payer: Medicare Other | Source: Ambulatory Visit | Attending: Orthopedic Surgery | Admitting: Orthopedic Surgery

## 2014-07-15 ENCOUNTER — Encounter (HOSPITAL_COMMUNITY): Payer: Self-pay

## 2014-07-15 DIAGNOSIS — Z01812 Encounter for preprocedural laboratory examination: Secondary | ICD-10-CM | POA: Diagnosis not present

## 2014-07-15 HISTORY — DX: Post-traumatic stress disorder, unspecified: F43.10

## 2014-07-15 HISTORY — DX: Claustrophobia: F40.240

## 2014-07-15 LAB — SURGICAL PCR SCREEN
MRSA, PCR: NEGATIVE
STAPHYLOCOCCUS AUREUS: NEGATIVE

## 2014-07-15 LAB — CBC
HEMATOCRIT: 42 % (ref 39.0–52.0)
Hemoglobin: 14.3 g/dL (ref 13.0–17.0)
MCH: 31.5 pg (ref 26.0–34.0)
MCHC: 34 g/dL (ref 30.0–36.0)
MCV: 92.5 fL (ref 78.0–100.0)
PLATELETS: 208 10*3/uL (ref 150–400)
RBC: 4.54 MIL/uL (ref 4.22–5.81)
RDW: 13.2 % (ref 11.5–15.5)
WBC: 5.8 10*3/uL (ref 4.0–10.5)

## 2014-07-15 LAB — BASIC METABOLIC PANEL
Anion gap: 11 (ref 5–15)
BUN: 16 mg/dL (ref 6–23)
CALCIUM: 8.8 mg/dL (ref 8.4–10.5)
CO2: 25 mEq/L (ref 19–32)
Chloride: 102 mEq/L (ref 96–112)
Creatinine, Ser: 1.08 mg/dL (ref 0.50–1.35)
GFR calc Af Amer: 79 mL/min — ABNORMAL LOW (ref >90)
GFR, EST NON AFRICAN AMERICAN: 69 mL/min — AB (ref >90)
Glucose, Bld: 95 mg/dL (ref 70–99)
Potassium: 4.3 mEq/L (ref 3.7–5.3)
SODIUM: 138 meq/L (ref 137–147)

## 2014-07-15 LAB — URINALYSIS, ROUTINE W REFLEX MICROSCOPIC
Bilirubin Urine: NEGATIVE
Glucose, UA: NEGATIVE mg/dL
HGB URINE DIPSTICK: NEGATIVE
Ketones, ur: NEGATIVE mg/dL
LEUKOCYTES UA: NEGATIVE
Nitrite: NEGATIVE
PH: 6 (ref 5.0–8.0)
PROTEIN: NEGATIVE mg/dL
Specific Gravity, Urine: 1.011 (ref 1.005–1.030)
Urobilinogen, UA: 0.2 mg/dL (ref 0.0–1.0)

## 2014-07-15 LAB — ABO/RH: ABO/RH(D): O POS

## 2014-07-15 LAB — APTT: aPTT: 31 seconds (ref 24–37)

## 2014-07-15 LAB — PROTIME-INR
INR: 1.05 (ref 0.00–1.49)
Prothrombin Time: 13.8 seconds (ref 11.6–15.2)

## 2014-07-15 NOTE — Pre-Procedure Instructions (Signed)
NOTE OF MEDICAL CLEARANCE ON PT'S CHART FROM DR. Melvyn Novas.

## 2014-07-15 NOTE — Pre-Procedure Instructions (Signed)
EKG AND CXR NOT NEEDED PER ANESTHESIOLOGIST'S GUIDELINES. 

## 2014-07-15 NOTE — Patient Instructions (Signed)
YOUR SURGERY IS SCHEDULED AT [facility]  ON:  Tuesday  10/27  REPORT TO  SHORT STAY CENTER AT:  8:30 AM   DO NOT EAT OR DRINK ANYTHING AFTER MIDNIGHT THE NIGHT BEFORE YOUR SURGERY.  YOU MAY BRUSH YOUR TEETH, RINSE OUT YOUR MOUTH--BUT NO WATER, NO FOOD, NO CHEWING GUM, NO MINTS, NO CANDIES, NO CHEWING TOBACCO.  PLEASE TAKE THE FOLLOWING MEDICATIONS THE AM OF YOUR SURGERY WITH A FEW SIPS OF WATER:  YOU MAY TAKE YOUR XANAX IF NEEDED.    DO NOT BRING VALUABLES, MONEY, CREDIT CARDS.  DO NOT WEAR JEWELRY, MAKE-UP, NAIL POLISH AND NO METAL PINS OR CLIPS IN YOUR HAIR. CONTACT LENS, DENTURES / PARTIALS, GLASSES SHOULD NOT BE WORN TO SURGERY AND IN MOST CASES-HEARING AIDS WILL NEED TO BE REMOVED.  BRING YOUR GLASSES CASE, ANY EQUIPMENT NEEDED FOR YOUR CONTACT LENS. FOR PATIENTS ADMITTED TO THE HOSPITAL--CHECK OUT TIME THE DAY OF DISCHARGE IS 11:00 AM.  ALL INPATIENT ROOMS ARE PRIVATE - WITH BATHROOM, TELEPHONE, TELEVISION AND WIFI INTERNET.    PLEASE BE AWARE THAT YOU MAY NEED ADDITIONAL BLOOD DRAWN DAY OF YOUR SURGERY  _______________________________________________________________________   [facility] - Preparing for Surgery Before surgery, you can play an important role.  Because skin is not sterile, your skin needs to be as free of germs as possible.  You can reduce the number of germs on your skin by washing with CHG (chlorahexidine gluconate) soap before surgery.  CHG is an antiseptic cleaner which kills germs and bonds with the skin to continue killing germs even after washing. Please DO NOT use if you have an allergy to CHG or antibacterial soaps.  If your skin becomes reddened/irritated stop using the CHG and inform your nurse when you arrive at Short Stay. Do not shave (including legs and underarms) for at least 48 hours prior to the first CHG shower.  You may shave your face/neck. Please follow these instructions carefully:  1.  Shower with CHG Soap the night before  surgery and the  morning of Surgery.  2.  If you choose to wash your hair, wash your hair first as usual with your  normal  shampoo.  3.  After you shampoo, rinse your hair and body thoroughly to remove the  shampoo.                           4.  Use CHG as you would any other liquid soap.  You can apply chg directly  to the skin and wash                       Gently with a scrungie or clean washcloth.  5.  Apply the CHG Soap to your body ONLY FROM THE NECK DOWN.   Do not use on face/ open                           Wound or open sores. Avoid contact with eyes, ears mouth and genitals (private parts).                       Wash face,  Genitals (private parts) with your normal soap.             6.  Wash thoroughly, paying special attention to the area where your surgery  will be performed.  7.  Thoroughly rinse  your body with warm water from the neck down.  8.  DO NOT shower/wash with your normal soap after using and rinsing off  the CHG Soap.                9.  Pat yourself dry with a clean towel.            10.  Wear clean pajamas.            11.  Place clean sheets on your bed the night of your first shower and do not  sleep with pets. Day of Surgery : Do not apply any lotions/deodorants the morning of surgery.  Please wear clean clothes to the hospital/surgery center.  FAILURE TO FOLLOW THESE INSTRUCTIONS MAY RESULT IN THE CANCELLATION OF YOUR SURGERY PATIENT SIGNATURE_________________________________  NURSE SIGNATURE__________________________________  ________________________________________________________________________   Adam Phenix  An incentive spirometer is a tool that can help keep your lungs clear and active. This tool measures how well you are filling your lungs with each breath. Taking long deep breaths may help reverse or decrease the chance of developing breathing (pulmonary) problems (especially infection) following:  A long period of time when you are unable to  move or be active. BEFORE THE PROCEDURE   If the spirometer includes an indicator to show your best effort, your nurse or respiratory therapist will set it to a desired goal.  If possible, sit up straight or lean slightly forward. Try not to slouch.  Hold the incentive spirometer in an upright position. INSTRUCTIONS FOR USE  1. Sit on the edge of your bed if possible, or sit up as far as you can in bed or on a chair. 2. Hold the incentive spirometer in an upright position. 3. Breathe out normally. 4. Place the mouthpiece in your mouth and seal your lips tightly around it. 5. Breathe in slowly and as deeply as possible, raising the piston or the ball toward the top of the column. 6. Hold your breath for 3-5 seconds or for as long as possible. Allow the piston or ball to fall to the bottom of the column. 7. Remove the mouthpiece from your mouth and breathe out normally. 8. Rest for a few seconds and repeat Steps 1 through 7 at least 10 times every 1-2 hours when you are awake. Take your time and take a few normal breaths between deep breaths. 9. The spirometer may include an indicator to show your best effort. Use the indicator as a goal to work toward during each repetition. 10. After each set of 10 deep breaths, practice coughing to be sure your lungs are clear. If you have an incision (the cut made at the time of surgery), support your incision when coughing by placing a pillow or rolled up towels firmly against it. Once you are able to get out of bed, walk around indoors and cough well. You may stop using the incentive spirometer when instructed by your caregiver.  RISKS AND COMPLICATIONS  Take your time so you do not get dizzy or light-headed.  If you are in pain, you may need to take or ask for pain medication before doing incentive spirometry. It is harder to take a deep breath if you are having pain. AFTER USE  Rest and breathe slowly and easily.  It can be helpful to keep track of  a log of your progress. Your caregiver can provide you with a simple table to help with this. If you are using the spirometer at  home, follow these instructions: [place] IF:   You are having difficultly using the spirometer.  You have trouble using the spirometer as often as instructed.  Your pain medication is not giving enough relief while using the spirometer.  You develop fever of 100.5 F (38.1 C) or higher. SEEK IMMEDIATE MEDICAL CARE IF:   You cough up bloody sputum that had not been present before.  You develop fever of 102 F (38.9 C) or greater.  You develop worsening pain at or near the incision site. MAKE SURE YOU:   Understand these instructions.  Will watch your condition.  Will get help right away if you are not doing well or get worse. Document Released: 01/22/2007 Document Revised: 12/04/2011 Document Reviewed: 03/25/2007 ExitCare Patient Information 2014 Exit[employer], [place].   ________________________________________________________________________  WHAT IS A BLOOD TRANSFUSION? Blood Transfusion Information  A transfusion is the replacement of blood or some of its parts. Blood is made up of multiple cells which provide different functions.  Red blood cells carry oxygen and are used for blood loss replacement.  White blood cells fight against infection.  Platelets control bleeding.  Plasma helps clot blood.  Other blood products are available for specialized needs, such as hemophilia or other clotting disorders. BEFORE THE TRANSFUSION  Who gives blood for transfusions?   Healthy volunteers who are fully evaluated to make sure their blood is safe. This is blood bank blood. Transfusion therapy is the safest it has ever been in the practice of medicine. Before blood is taken from a donor, a complete history is taken to make sure that person has no history of diseases nor engages in risky social behavior (examples are intravenous drug use or sexual  activity with multiple partners). The donor's travel history is screened to minimize risk of transmitting infections, such as malaria. The donated blood is tested for signs of infectious diseases, such as HIV and hepatitis. The blood is then tested to be sure it is compatible with you in order to minimize the chance of a transfusion reaction. If you or a relative donates blood, this is often done in anticipation of surgery and is not appropriate for emergency situations. It takes many days to process the donated blood. RISKS AND COMPLICATIONS Although transfusion therapy is very safe and saves many lives, the main dangers of transfusion include:   Getting an infectious disease.  Developing a transfusion reaction. This is an allergic reaction to something in the blood you were given. Every precaution is taken to prevent this. The decision to have a blood transfusion has been considered carefully by your caregiver before blood is given. Blood is not given unless the benefits outweigh the risks. AFTER THE TRANSFUSION  Right after receiving a blood transfusion, you will usually feel much better and more energetic. This is especially true if your red blood cells have gotten low (anemic). The transfusion raises the level of the red blood cells which carry oxygen, and this usually causes an energy increase.  The nurse administering the transfusion will monitor you carefully for complications. HOME CARE INSTRUCTIONS  No special instructions are needed after a transfusion. You may find your energy is better. Speak with your caregiver about any limitations on activity for underlying diseases you may have. SEEK MEDICAL CARE IF:   Your condition is not improving after your transfusion.  You develop redness or irritation at the intravenous (IV) site. SEEK IMMEDIATE MEDICAL CARE IF:  Any of the following symptoms occur over the next 12  hours:  Shaking chills.  You have a temperature by mouth above 102 F  (38.9 C), not controlled by medicine.  Chest, back, or muscle pain.  People around you feel you are not acting correctly or are confused.  Shortness of breath or difficulty breathing.  Dizziness and fainting.  You get a rash or develop hives.  You have a decrease in urine output.  Your urine turns a dark color or changes to pink, red, or brown. Any of the following symptoms occur over the next 10 days:  You have a temperature by mouth above 102 F (38.9 C), not controlled by medicine.  Shortness of breath.  Weakness after normal activity.  The white part of the eye turns yellow (jaundice).  You have a decrease in the amount of urine or are urinating less often.  Your urine turns a dark color or changes to pink, red, or brown. Document Released: 09/08/2000 Document Revised: 12/04/2011 Document Reviewed: 04/27/2008 [facility] Patient Information 2014 [place], [place].  _______________________________________________________________________

## 2014-07-17 NOTE — H&P (Signed)
TOTAL KNEE ADMISSION H&P  Patient is being admitted for right total knee arthroplasty.  Subjective:  Chief Complaint:     Right knee OA / pain.  HPI: Joshua [name], 68 y.o. male, has a history of pain and functional disability in the right knee due to arthritis and has failed non-surgical conservative treatments for greater than 12 weeks to includeNSAID's and/or analgesics, corticosteriod injections, viscosupplementation injections and activity modification.  Onset of symptoms was gradual, starting 2+ years ago with gradually worsening course since that time. The patient noted prior procedures on the knee to include  arthroscopy on the right knee(s).  Patient currently rates pain in the right knee(s) at 8 out of 10 with activity. Patient has worsening of pain with activity and weight bearing, pain that interferes with activities of daily living, pain with passive range of motion, crepitus and joint swelling.  Patient has evidence of periarticular osteophytes and joint space narrowing by imaging studies. There is no active infection.  Risks, benefits and expectations were discussed with the patient.  Risks including but not limited to the risk of anesthesia, blood clots, nerve damage, blood vessel damage, failure of the prosthesis, infection and up to and including death.  Patient understand the risks, benefits and expectations and wishes to proceed with surgery.   PCP: Christinia Gully, MD  D/C Plans:      Home with HHPT  Post-op Meds:       No Rx given  Tranexamic Acid:      To be given - IV    Decadron:      Is to be given  FYI:     ASA post-op  Norco post-op    Patient Active Problem List   Diagnosis Date Noted  . HBP (high blood pressure) 10/03/2013  . TOS (thoracic outlet syndrome)   . Cough 10/27/2008  . HYPERLIPIDEMIA 09/16/2008  . ANXIETY, CHRONIC 09/16/2008  . UPPER RESPIRATORY INFECTION 09/16/2008  . Chronic rhinitis 09/16/2008  . DIVERTICULOSIS OF COLON 09/16/2008  .  DEGENERATIVE JOINT DISEASE 09/16/2008  . VERTIGO 09/16/2008  . Headache(784.0) 09/16/2008   Past Medical History  Diagnosis Date  . Hyperlipidemia   . Diverticulosis of colon (without mention of hemorrhage)   . Chronic anxiety   . Chronic rhinitis   . Hx of vertigo   . PTSD (post-traumatic stress disorder)     [place] [employer]  . DJD (degenerative joint disease)     OA AND PAIN RIGHT KNEE  . GERD (gastroesophageal reflux disease)     ONLY WITH CERTAIN FOODS  . TOS (thoracic outlet syndrome)     Seeing  a physical therapist--PAIN RIGHT SHOULDER- MUCH IMPROVED  . Claustrophobia     Past Surgical History  Procedure Laterality Date  . Cervical disc surgery  2009    c 5-6/ by Dr Trenton Gammon- FUSION  . Colonoscopy    . Tonsillectomy      AS A CHILD    No prescriptions prior to admission   No Known Allergies   History  Substance Use Topics  . Smoking status: Former Smoker -- 0.50 packs/day for 5 years    Types: Cigarettes    Quit date: 09/25/1973  . Smokeless tobacco: Never Used  . Alcohol Use: 3.5 oz/week    7 drink(s) per week     Comment: occ    Family History  Problem Relation Age of Onset  . Colon cancer Paternal Aunt      Review of Systems  Constitutional: Negative.  Eyes: Negative.   Respiratory: Positive for cough.   Cardiovascular: Negative.   Gastrointestinal: Positive for heartburn and constipation.  Genitourinary: Positive for urgency and frequency.  Musculoskeletal: Positive for joint pain.  Skin: Negative.   Neurological: Positive for headaches.  Endo/Heme/Allergies: Negative.   Psychiatric/Behavioral: The patient is nervous/anxious.     Objective:  Physical Exam  Constitutional: He is oriented to person, place, and time. He appears well-developed and well-nourished.  HENT:  Head: Normocephalic and atraumatic.  Eyes: Pupils are equal, round, and reactive to light.  Neck: Neck supple. No JVD present. No tracheal deviation present. No thyromegaly  present.  Cardiovascular: Normal rate, regular rhythm, normal heart sounds and intact distal pulses.   Respiratory: Effort normal and breath sounds normal. No stridor. No respiratory distress. He has no wheezes.  GI: Soft. There is no tenderness. There is no guarding.  Musculoskeletal:       Right knee: He exhibits decreased range of motion, swelling and bony tenderness. He exhibits no ecchymosis, no deformity, no laceration and no erythema. Tenderness found.  Lymphadenopathy:    He has no cervical adenopathy.  Neurological: He is alert and oriented to person, place, and time.  Skin: Skin is warm and dry.  Psychiatric: He has a normal mood and affect.      Labs:  Estimated body mass index is 25.52 kg/(m^2) as calculated from the following:   Height as of 10/16/13: 6\' 1"  (1.854 m).   Weight as of 10/16/13: 87.726 kg (193 lb 6.4 oz).   Imaging Review Plain radiographs demonstrate severe degenerative joint disease of the right knee(s). The overall alignment is neutral. The bone quality appears to be good for age and reported activity level.  Assessment/Plan:  End stage arthritis, right knee   The patient history, physical examination, clinical judgment of the provider and imaging studies are consistent with end stage degenerative joint disease of the right knee(s) and total knee arthroplasty is deemed medically necessary. The treatment options including medical management, injection therapy arthroscopy and arthroplasty were discussed at length. The risks and benefits of total knee arthroplasty were presented and reviewed. The risks due to aseptic loosening, infection, stiffness, patella tracking problems, thromboembolic complications and other imponderables were discussed. The patient acknowledged the explanation, agreed to proceed with the plan and consent was signed. Patient is being admitted for inpatient treatment for surgery, pain control, PT, OT, prophylactic antibiotics, VTE  prophylaxis, progressive ambulation and ADL's and discharge planning. The patient is planning to be discharged home with home health services.     West Pugh Brindle Leyba   PA-C  07/17/2014, 5:05 PM

## 2014-07-20 ENCOUNTER — Ambulatory Visit (INDEPENDENT_AMBULATORY_CARE_PROVIDER_SITE_OTHER): Payer: Medicare Other

## 2014-07-20 DIAGNOSIS — Z23 Encounter for immunization: Secondary | ICD-10-CM

## 2014-07-21 ENCOUNTER — Inpatient Hospital Stay (HOSPITAL_COMMUNITY): Payer: Medicare Other | Admitting: Anesthesiology

## 2014-07-21 ENCOUNTER — Encounter (HOSPITAL_COMMUNITY): Payer: Medicare Other | Admitting: Anesthesiology

## 2014-07-21 ENCOUNTER — Encounter: Payer: Self-pay | Admitting: Internal Medicine

## 2014-07-21 ENCOUNTER — Encounter (HOSPITAL_COMMUNITY): Payer: Self-pay

## 2014-07-21 ENCOUNTER — Inpatient Hospital Stay (HOSPITAL_COMMUNITY)
Admission: RE | Admit: 2014-07-21 | Discharge: 2014-07-22 | DRG: 470 | Disposition: A | Discharge status: Home Health | Payer: Medicare Other | Source: Ambulatory Visit | Attending: Orthopedic Surgery | Admitting: Orthopedic Surgery

## 2014-07-21 ENCOUNTER — Encounter (HOSPITAL_COMMUNITY)
Admission: RE | Disposition: A | Discharge status: Home Health | Payer: Self-pay | Source: Ambulatory Visit | Attending: Orthopedic Surgery

## 2014-07-21 DIAGNOSIS — F4310 Post-traumatic stress disorder, unspecified: Secondary | ICD-10-CM | POA: Diagnosis present

## 2014-07-21 DIAGNOSIS — M25761 Osteophyte, right knee: Secondary | ICD-10-CM | POA: Diagnosis present

## 2014-07-21 DIAGNOSIS — J310 Chronic rhinitis: Secondary | ICD-10-CM | POA: Diagnosis present

## 2014-07-21 DIAGNOSIS — Z87891 Personal history of nicotine dependence: Secondary | ICD-10-CM | POA: Diagnosis not present

## 2014-07-21 DIAGNOSIS — M659 Synovitis and tenosynovitis, unspecified: Secondary | ICD-10-CM | POA: Diagnosis present

## 2014-07-21 DIAGNOSIS — M25461 Effusion, right knee: Secondary | ICD-10-CM | POA: Diagnosis present

## 2014-07-21 DIAGNOSIS — M179 Osteoarthritis of knee, unspecified: Secondary | ICD-10-CM | POA: Diagnosis present

## 2014-07-21 DIAGNOSIS — F419 Anxiety disorder, unspecified: Secondary | ICD-10-CM | POA: Diagnosis present

## 2014-07-21 DIAGNOSIS — K219 Gastro-esophageal reflux disease without esophagitis: Secondary | ICD-10-CM | POA: Diagnosis present

## 2014-07-21 DIAGNOSIS — E785 Hyperlipidemia, unspecified: Secondary | ICD-10-CM | POA: Diagnosis present

## 2014-07-21 DIAGNOSIS — Z96651 Presence of right artificial knee joint: Secondary | ICD-10-CM

## 2014-07-21 DIAGNOSIS — F40240 Claustrophobia: Secondary | ICD-10-CM | POA: Diagnosis present

## 2014-07-21 DIAGNOSIS — Z800 Family history of malignant neoplasm of digestive organs: Secondary | ICD-10-CM | POA: Diagnosis not present

## 2014-07-21 DIAGNOSIS — M25561 Pain in right knee: Secondary | ICD-10-CM | POA: Diagnosis present

## 2014-07-21 DIAGNOSIS — Z01812 Encounter for preprocedural laboratory examination: Secondary | ICD-10-CM | POA: Diagnosis not present

## 2014-07-21 DIAGNOSIS — Z96659 Presence of unspecified artificial knee joint: Secondary | ICD-10-CM

## 2014-07-21 HISTORY — PX: TOTAL KNEE ARTHROPLASTY: SHX125

## 2014-07-21 LAB — TYPE AND SCREEN
ABO/RH(D): O POS
Antibody Screen: NEGATIVE

## 2014-07-21 SURGERY — ARTHROPLASTY, KNEE, TOTAL
Anesthesia: Spinal | Site: Knee | Laterality: Right

## 2014-07-21 MED ORDER — KETOROLAC TROMETHAMINE 30 MG/ML IJ SOLN
INTRAMUSCULAR | Status: DC | PRN
Start: 2014-07-21 — End: 2014-07-21
  Administered 2014-07-21: 30 mg

## 2014-07-21 MED ORDER — SODIUM CHLORIDE 0.9 % IV SOLN
INTRAVENOUS | Status: DC
  Administered 2014-07-21: 15:00:00 via INTRAVENOUS
  Filled 2014-07-21 (×3): qty 1000

## 2014-07-21 MED ORDER — PROPOFOL INFUSION 10 MG/ML OPTIME
INTRAVENOUS | Status: DC | PRN
Start: 2014-07-21 — End: 2014-07-21
  Administered 2014-07-21: 120 ug/kg/min via INTRAVENOUS

## 2014-07-21 MED ORDER — BUPIVACAINE-EPINEPHRINE (PF) 0.25% -1:200000 IJ SOLN
INTRAMUSCULAR | Status: AC
  Filled 2014-07-21: qty 30

## 2014-07-21 MED ORDER — METHOCARBAMOL 500 MG PO TABS
500.0000 mg | ORAL_TABLET | Freq: Four times a day (QID) | ORAL | Status: DC | PRN
  Administered 2014-07-21 – 2014-07-22 (×3): 500 mg via ORAL
  Filled 2014-07-21 (×3): qty 1

## 2014-07-21 MED ORDER — PHENOL 1.4 % MT LIQD
1.0000 | OROMUCOSAL | Status: DC | PRN
  Filled 2014-07-21: qty 177

## 2014-07-21 MED ORDER — ALUM & MAG HYDROXIDE-SIMETH 200-200-20 MG/5ML PO SUSP: 30.0000 mL | ORAL | Status: DC | PRN

## 2014-07-21 MED ORDER — MIDAZOLAM HCL 5 MG/5ML IJ SOLN
INTRAMUSCULAR | Status: DC | PRN
  Administered 2014-07-21: 1 mg via INTRAVENOUS
  Administered 2014-07-21: 2 mg via INTRAVENOUS
  Administered 2014-07-21: 1 mg via INTRAVENOUS

## 2014-07-21 MED ORDER — DEXAMETHASONE SODIUM PHOSPHATE 10 MG/ML IJ SOLN
INTRAMUSCULAR | Status: AC
  Filled 2014-07-21: qty 1

## 2014-07-21 MED ORDER — METOCLOPRAMIDE HCL 10 MG PO TABS: 5.0000 mg | ORAL_TABLET | Freq: Three times a day (TID) | ORAL | Status: DC | PRN

## 2014-07-21 MED ORDER — DEXAMETHASONE SODIUM PHOSPHATE 10 MG/ML IJ SOLN
INTRAMUSCULAR | Status: DC | PRN
  Administered 2014-07-21: 10 mg via INTRAVENOUS

## 2014-07-21 MED ORDER — LACTATED RINGERS IV SOLN
INTRAVENOUS | Status: DC
  Administered 2014-07-21: 1000 mL via INTRAVENOUS
  Administered 2014-07-21: 12:00:00 via INTRAVENOUS

## 2014-07-21 MED ORDER — DIPHENHYDRAMINE HCL 25 MG PO CAPS
25.0000 mg | ORAL_CAPSULE | Freq: Four times a day (QID) | ORAL | Status: DC | PRN
  Administered 2014-07-22: 25 mg via ORAL

## 2014-07-21 MED ORDER — TRANEXAMIC ACID 100 MG/ML IV SOLN
1000.0000 mg | Freq: Once | INTRAVENOUS | Status: AC
  Administered 2014-07-21: 1000 mg via INTRAVENOUS
  Filled 2014-07-21: qty 10

## 2014-07-21 MED ORDER — ONDANSETRON HCL 4 MG/2ML IJ SOLN
INTRAMUSCULAR | Status: DC | PRN
  Administered 2014-07-21: 4 mg via INTRAVENOUS

## 2014-07-21 MED ORDER — PROPOFOL 10 MG/ML IV BOLUS
INTRAVENOUS | Status: AC
  Filled 2014-07-21: qty 20

## 2014-07-21 MED ORDER — ONDANSETRON HCL 4 MG PO TABS: 4.0000 mg | ORAL_TABLET | Freq: Four times a day (QID) | ORAL | Status: DC | PRN

## 2014-07-21 MED ORDER — LIDOCAINE HCL (CARDIAC) 20 MG/ML IV SOLN
INTRAVENOUS | Status: AC
  Filled 2014-07-21: qty 5

## 2014-07-21 MED ORDER — FERROUS SULFATE 325 (65 FE) MG PO TABS
325.0000 mg | ORAL_TABLET | Freq: Three times a day (TID) | ORAL | Status: DC
  Administered 2014-07-21 – 2014-07-22 (×3): 325 mg via ORAL
  Filled 2014-07-21 (×5): qty 1

## 2014-07-21 MED ORDER — HYDROMORPHONE HCL 1 MG/ML IJ SOLN
0.5000 mg | INTRAMUSCULAR | Status: DC | PRN
  Administered 2014-07-21 – 2014-07-22 (×2): 1 mg via INTRAVENOUS
  Filled 2014-07-21 (×2): qty 1

## 2014-07-21 MED ORDER — ALPRAZOLAM 0.5 MG PO TABS
0.5000 mg | ORAL_TABLET | Freq: Two times a day (BID) | ORAL | Status: DC | PRN
  Administered 2014-07-21: 0.5 mg via ORAL
  Filled 2014-07-21: qty 1

## 2014-07-21 MED ORDER — 0.9 % SODIUM CHLORIDE (POUR BTL) OPTIME
TOPICAL | Status: DC | PRN
  Administered 2014-07-21: 1000 mL

## 2014-07-21 MED ORDER — OXYCODONE HCL 5 MG/5ML PO SOLN: 5.0000 mg | Freq: Once | ORAL | Status: DC | PRN

## 2014-07-21 MED ORDER — OXYCODONE HCL 5 MG PO TABS: 5.0000 mg | ORAL_TABLET | Freq: Once | ORAL | Status: DC | PRN

## 2014-07-21 MED ORDER — HYDROMORPHONE HCL 1 MG/ML IJ SOLN: 0.2500 mg | INTRAMUSCULAR | Status: DC | PRN

## 2014-07-21 MED ORDER — DOCUSATE SODIUM 100 MG PO CAPS
100.0000 mg | ORAL_CAPSULE | Freq: Two times a day (BID) | ORAL | Status: DC
  Administered 2014-07-21 – 2014-07-22 (×2): 100 mg via ORAL

## 2014-07-21 MED ORDER — SODIUM CHLORIDE 0.9 % IJ SOLN
INTRAMUSCULAR | Status: DC | PRN
  Administered 2014-07-21: 30 mL

## 2014-07-21 MED ORDER — PHENYLEPHRINE HCL 10 MG/ML IJ SOLN
INTRAMUSCULAR | Status: DC | PRN
  Administered 2014-07-21: 120 ug via INTRAVENOUS
  Administered 2014-07-21 (×2): 40 ug via INTRAVENOUS

## 2014-07-21 MED ORDER — CHLORHEXIDINE GLUCONATE 4 % EX LIQD: 60.0000 mL | Freq: Once | CUTANEOUS | Status: DC

## 2014-07-21 MED ORDER — PHENYLEPHRINE 40 MCG/ML (10ML) SYRINGE FOR IV PUSH (FOR BLOOD PRESSURE SUPPORT)
PREFILLED_SYRINGE | INTRAVENOUS | Status: AC
  Filled 2014-07-21: qty 10

## 2014-07-21 MED ORDER — CELECOXIB 200 MG PO CAPS
200.0000 mg | ORAL_CAPSULE | Freq: Two times a day (BID) | ORAL | Status: DC
  Administered 2014-07-21: 200 mg via ORAL
  Filled 2014-07-21 (×3): qty 1

## 2014-07-21 MED ORDER — KETOROLAC TROMETHAMINE 30 MG/ML IJ SOLN
INTRAMUSCULAR | Status: AC
  Filled 2014-07-21: qty 1

## 2014-07-21 MED ORDER — PROMETHAZINE HCL 25 MG/ML IJ SOLN: 6.2500 mg | INTRAMUSCULAR | Status: DC | PRN

## 2014-07-21 MED ORDER — TRIAMCINOLONE ACETONIDE 55 MCG/ACT NA AERO: 1.0000 | INHALATION_SPRAY | Freq: Every day | NASAL | Status: DC

## 2014-07-21 MED ORDER — MIDAZOLAM HCL 2 MG/2ML IJ SOLN
INTRAMUSCULAR | Status: AC
  Filled 2014-07-21: qty 2

## 2014-07-21 MED ORDER — METOCLOPRAMIDE HCL 5 MG/ML IJ SOLN
INTRAMUSCULAR | Status: AC
  Filled 2014-07-21: qty 2

## 2014-07-21 MED ORDER — DEXAMETHASONE SODIUM PHOSPHATE 10 MG/ML IJ SOLN: 10.0000 mg | Freq: Once | INTRAMUSCULAR | Status: DC

## 2014-07-21 MED ORDER — CEFAZOLIN SODIUM-DEXTROSE 2-3 GM-% IV SOLR
INTRAVENOUS | Status: AC
  Filled 2014-07-21: qty 50

## 2014-07-21 MED ORDER — MEPERIDINE HCL 50 MG/ML IJ SOLN: 6.2500 mg | INTRAMUSCULAR | Status: DC | PRN

## 2014-07-21 MED ORDER — ASPIRIN EC 325 MG PO TBEC
325.0000 mg | DELAYED_RELEASE_TABLET | Freq: Two times a day (BID) | ORAL | Status: DC
  Administered 2014-07-22: 325 mg via ORAL
  Filled 2014-07-21 (×3): qty 1

## 2014-07-21 MED ORDER — BISACODYL 10 MG RE SUPP: 10.0000 mg | Freq: Every day | RECTAL | Status: DC | PRN

## 2014-07-21 MED ORDER — HYDROCODONE-ACETAMINOPHEN 7.5-325 MG PO TABS
1.0000 | ORAL_TABLET | ORAL | Status: DC
  Administered 2014-07-21 – 2014-07-22 (×4): 2 via ORAL
  Filled 2014-07-21 (×5): qty 2

## 2014-07-21 MED ORDER — CEFAZOLIN SODIUM-DEXTROSE 2-3 GM-% IV SOLR
2.0000 g | Freq: Four times a day (QID) | INTRAVENOUS | Status: AC
  Administered 2014-07-21 – 2014-07-22 (×2): 2 g via INTRAVENOUS
  Filled 2014-07-21 (×2): qty 50

## 2014-07-21 MED ORDER — MENTHOL 3 MG MT LOZG
1.0000 | LOZENGE | OROMUCOSAL | Status: DC | PRN
  Filled 2014-07-21: qty 9

## 2014-07-21 MED ORDER — SODIUM CHLORIDE 0.9 % IJ SOLN
INTRAMUSCULAR | Status: AC
  Filled 2014-07-21: qty 50

## 2014-07-21 MED ORDER — DEXAMETHASONE SODIUM PHOSPHATE 10 MG/ML IJ SOLN
10.0000 mg | Freq: Once | INTRAMUSCULAR | Status: AC
  Administered 2014-07-22: 10 mg via INTRAVENOUS
  Filled 2014-07-21: qty 1

## 2014-07-21 MED ORDER — METOCLOPRAMIDE HCL 5 MG/ML IJ SOLN
INTRAMUSCULAR | Status: DC | PRN
  Administered 2014-07-21: 10 mg via INTRAVENOUS

## 2014-07-21 MED ORDER — SODIUM CHLORIDE 0.9 % IR SOLN
Status: DC | PRN
  Administered 2014-07-21: 1000 mL

## 2014-07-21 MED ORDER — ONDANSETRON HCL 4 MG/2ML IJ SOLN: 4.0000 mg | Freq: Four times a day (QID) | INTRAMUSCULAR | Status: DC | PRN

## 2014-07-21 MED ORDER — MAGNESIUM CITRATE PO SOLN: 1.0000 | Freq: Once | ORAL | Status: AC | PRN

## 2014-07-21 MED ORDER — BUPIVACAINE-EPINEPHRINE (PF) 0.25% -1:200000 IJ SOLN
INTRAMUSCULAR | Status: DC | PRN
  Administered 2014-07-21: 30 mL

## 2014-07-21 MED ORDER — ONDANSETRON HCL 4 MG/2ML IJ SOLN
INTRAMUSCULAR | Status: AC
  Filled 2014-07-21: qty 2

## 2014-07-21 MED ORDER — BUPIVACAINE IN DEXTROSE 0.75-8.25 % IT SOLN
INTRATHECAL | Status: DC | PRN
  Administered 2014-07-21: 1.6 mL via INTRATHECAL

## 2014-07-21 MED ORDER — CEFAZOLIN SODIUM-DEXTROSE 2-3 GM-% IV SOLR
2.0000 g | INTRAVENOUS | Status: AC
  Administered 2014-07-21: 2 g via INTRAVENOUS

## 2014-07-21 MED ORDER — FENTANYL CITRATE 0.05 MG/ML IJ SOLN
INTRAMUSCULAR | Status: AC
  Filled 2014-07-21: qty 2

## 2014-07-21 MED ORDER — METOCLOPRAMIDE HCL 5 MG/ML IJ SOLN: 5.0000 mg | Freq: Three times a day (TID) | INTRAMUSCULAR | Status: DC | PRN

## 2014-07-21 MED ORDER — DEXTROSE 5 % IV SOLN
500.0000 mg | Freq: Four times a day (QID) | INTRAVENOUS | Status: DC | PRN
  Filled 2014-07-21: qty 5

## 2014-07-21 MED ORDER — FLUTICASONE PROPIONATE 50 MCG/ACT NA SUSP
1.0000 | Freq: Every day | NASAL | Status: DC
  Filled 2014-07-21: qty 16

## 2014-07-21 MED ORDER — POLYETHYLENE GLYCOL 3350 17 G PO PACK
17.0000 g | PACK | Freq: Two times a day (BID) | ORAL | Status: DC
  Administered 2014-07-21 – 2014-07-22 (×2): 17 g via ORAL

## 2014-07-21 SURGICAL SUPPLY — 56 items
BAG ZIPLOCK 12X15 (MISCELLANEOUS) ×2 IMPLANT
BANDAGE ELASTIC 6 VELCRO ST LF (GAUZE/BANDAGES/DRESSINGS) ×2 IMPLANT
BANDAGE ESMARK 6X9 LF (GAUZE/BANDAGES/DRESSINGS) ×1 IMPLANT
BLADE SAW SGTL 13.0X1.19X90.0M (BLADE) ×2 IMPLANT
BNDG ESMARK 6X9 LF (GAUZE/BANDAGES/DRESSINGS) ×2
BOWL SMART MIX CTS (DISPOSABLE) ×2 IMPLANT
CAPT RP KNEE ×2 IMPLANT
CEMENT HV SMART SET (Cement) ×4 IMPLANT
CUFF TOURN SGL QUICK 34 (TOURNIQUET CUFF) ×1
CUFF TRNQT CYL 34X4X40X1 (TOURNIQUET CUFF) ×1 IMPLANT
DECANTER SPIKE VIAL GLASS SM (MISCELLANEOUS) IMPLANT
DERMABOND ADVANCED (GAUZE/BANDAGES/DRESSINGS) ×1
DERMABOND ADVANCED .7 DNX12 (GAUZE/BANDAGES/DRESSINGS) ×1 IMPLANT
DRAPE EXTREMITY TIBURON (DRAPES) ×2 IMPLANT
DRAPE POUCH INSTRU U-SHP 10X18 (DRAPES) ×2 IMPLANT
DRAPE U-SHAPE 47X51 STRL (DRAPES) ×2 IMPLANT
DRSG AQUACEL AG ADV 3.5X10 (GAUZE/BANDAGES/DRESSINGS) ×2 IMPLANT
DURAPREP 26ML APPLICATOR (WOUND CARE) ×4 IMPLANT
ELECT REM PT RETURN 9FT ADLT (ELECTROSURGICAL) ×2
ELECTRODE REM PT RTRN 9FT ADLT (ELECTROSURGICAL) ×1 IMPLANT
FACESHIELD WRAPAROUND (MASK) ×8 IMPLANT
GLOVE BIO SURGEON STRL SZ7.5 (GLOVE) ×2 IMPLANT
GLOVE BIO SURGEON STRL SZ8 (GLOVE) ×2 IMPLANT
GLOVE BIOGEL M 7.0 STRL (GLOVE) ×2 IMPLANT
GLOVE BIOGEL PI IND STRL 7.5 (GLOVE) ×3 IMPLANT
GLOVE BIOGEL PI IND STRL 8.5 (GLOVE) ×1 IMPLANT
GLOVE BIOGEL PI INDICATOR 7.5 (GLOVE) ×3
GLOVE BIOGEL PI INDICATOR 8.5 (GLOVE) ×1
GLOVE ECLIPSE 8.0 STRL XLNG CF (GLOVE) ×2 IMPLANT
GLOVE ORTHO TXT STRL SZ7.5 (GLOVE) ×4 IMPLANT
GOWN SPEC L3 XXLG W/TWL (GOWN DISPOSABLE) IMPLANT
GOWN STRL REUS W/ TWL XL LVL3 (GOWN DISPOSABLE) ×3 IMPLANT
GOWN STRL REUS W/TWL LRG LVL3 (GOWN DISPOSABLE) ×2 IMPLANT
GOWN STRL REUS W/TWL XL LVL3 (GOWN DISPOSABLE) ×3
HANDPIECE INTERPULSE COAX TIP (DISPOSABLE) ×1
KIT BASIN OR (CUSTOM PROCEDURE TRAY) ×2 IMPLANT
MANIFOLD NEPTUNE II (INSTRUMENTS) ×2 IMPLANT
NDL SAFETY ECLIPSE 18X1.5 (NEEDLE) ×1 IMPLANT
NEEDLE HYPO 18GX1.5 SHARP (NEEDLE) ×1
PACK TOTAL JOINT (CUSTOM PROCEDURE TRAY) ×2 IMPLANT
POSITIONER SURGICAL ARM (MISCELLANEOUS) ×2 IMPLANT
SET HNDPC FAN SPRY TIP SCT (DISPOSABLE) ×1 IMPLANT
SET PAD KNEE POSITIONER (MISCELLANEOUS) ×2 IMPLANT
SUCTION FRAZIER 12FR DISP (SUCTIONS) ×2 IMPLANT
SUT MNCRL AB 4-0 PS2 18 (SUTURE) ×2 IMPLANT
SUT VIC AB 1 CT1 36 (SUTURE) ×2 IMPLANT
SUT VIC AB 2-0 CT1 27 (SUTURE) ×3
SUT VIC AB 2-0 CT1 TAPERPNT 27 (SUTURE) ×3 IMPLANT
SUT VLOC 180 0 24IN GS25 (SUTURE) ×2 IMPLANT
SYR 50ML LL SCALE MARK (SYRINGE) ×2 IMPLANT
TOWEL OR 17X26 10 PK STRL BLUE (TOWEL DISPOSABLE) ×2 IMPLANT
TOWEL OR NON WOVEN STRL DISP B (DISPOSABLE) ×2 IMPLANT
TRAY FOLEY CATH 14FRSI W/METER (CATHETERS) IMPLANT
TRAY FOLEY CATH 16FR SILVER (SET/KITS/TRAYS/PACK) ×2 IMPLANT
WATER STERILE IRR 1500ML POUR (IV SOLUTION) ×2 IMPLANT
WRAP KNEE MAXI GEL POST OP (GAUZE/BANDAGES/DRESSINGS) ×2 IMPLANT

## 2014-07-21 NOTE — Progress Notes (Signed)
Scheduled procedure is on Medicare IP only list.  Will need order for INPATIENT post-op. Thanks you  

## 2014-07-21 NOTE — Anesthesia Procedure Notes (Signed)
Spinal  Patient location during procedure: OR Staffing Anesthesiologist: Nolon Nations R Performed by: anesthesiologist  Preanesthetic Checklist Completed: patient identified, site marked, surgical consent, pre-op evaluation, timeout performed, IV checked, risks and benefits discussed and monitors and equipment checked Spinal Block Patient position: sitting Prep: Betadine Patient monitoring: heart rate, continuous pulse ox and blood pressure Approach: left paramedian Location: L3-4 Injection technique: single-shot Needle Needle type: Spinocan  Needle gauge: 22 G Needle length: 9 cm Assessment Sensory level: T8 Additional Notes Expiration date of kit checked and confirmed. Patient tolerated procedure well, without complications.

## 2014-07-21 NOTE — Anesthesia Preprocedure Evaluation (Signed)
Anesthesia Evaluation  Patient identified by MRN, date of birth, ID band Patient awake    Reviewed: Allergy & Precautions, H&P , NPO status , Patient's Chart, lab work & pertinent test results  Airway Mallampati: II  TM Distance: >3 FB Neck ROM: Full    Dental no notable dental hx.    Pulmonary neg pulmonary ROS, former smoker,  breath sounds clear to auscultation  Pulmonary exam normal       Cardiovascular hypertension, Rhythm:Regular Rate:Normal     Neuro/Psych  Headaches, PSYCHIATRIC DISORDERS Anxiety  Neuromuscular disease    GI/Hepatic Neg liver ROS, GERD-  ,  Endo/Other  negative endocrine ROS  Renal/GU negative Renal ROS     Musculoskeletal  (+) Arthritis -,   Abdominal   Peds  Hematology negative hematology ROS (+)   Anesthesia Other Findings   Reproductive/Obstetrics                             Anesthesia Physical Anesthesia Plan  ASA: II  Anesthesia Plan:    Post-op Pain Management:    Induction:   Airway Management Planned:   Additional Equipment:   Intra-op Plan:   Post-operative Plan:   Informed Consent: I have reviewed the patients History and Physical, chart, labs and discussed the procedure including the risks, benefits and alternatives for the proposed anesthesia with the patient or authorized representative who has indicated his/her understanding and acceptance.   Dental advisory given  Plan Discussed with: CRNA  Anesthesia Plan Comments:         Anesthesia Quick Evaluation

## 2014-07-21 NOTE — Plan of Care (Signed)
Problem: Consults Goal: Diagnosis- Total Joint Replacement Outcome: Completed/Met Date Met:  07/21/14 Primary Total Knee RIGHT  Problem: Phase III Progression Outcomes Goal: Anticoagulant follow-up in place Outcome: Not Applicable Date Met:  26/37/85 ASA for VTE, no f/u needed.

## 2014-07-21 NOTE — Op Note (Signed)
NAME:  Joshua [name]                      MEDICAL RECORD NO.:  [id number]                             FACILITY:  [facility]      PHYSICIAN:  Pietro Cassis. Alvan Dame, M.D.  DATE OF BIRTH:  05-04-1946      DATE OF PROCEDURE:  07/21/2014                                     OPERATIVE REPORT         PREOPERATIVE DIAGNOSIS:  Right knee osteoarthritis.      POSTOPERATIVE DIAGNOSIS:  Right knee osteoarthritis.      FINDINGS:  The patient was noted to have complete loss of cartilage and   bone-on-bone arthritis with associated osteophytes in the medial and patellofemoral compartments of   the knee with a significant synovitis and associated effusion.      PROCEDURE:  Right total knee replacement.      COMPONENTS USED:  DePuy rotating platform posterior stabilized knee   system, a size 5 femur, 5 tibia, 12.5 mm PS insert, and 41 patellar   button.      SURGEON:  Pietro Cassis. Alvan Dame, M.D.      ASSISTANT:  Nehemiah Massed, PA-C.      ANESTHESIA:  Spinal.      SPECIMENS:  None.      COMPLICATION:  None.      DRAINS:  None.  EBL: <100cc      TOURNIQUET TIME:   Total Tourniquet Time Documented: Thigh (Right) - 34 minutes Total: Thigh (Right) - 34 minutes  .      The patient was stable to the recovery room.      INDICATION FOR PROCEDURE:  Joshua [name] is a 68 y.o. male patient of   mine.  The patient had been seen, evaluated, and treated conservatively in the   office with medication, activity modification, and injections.  The patient had   radiographic changes of bone-on-bone arthritis with endplate sclerosis and osteophytes noted.      The patient failed conservative measures including medication, injections, and activity modification, and at this point was ready for more definitive measures.   Based on the radiographic changes and failed conservative measures, the patient   decided to proceed with total knee replacement.  Risks of infection,   DVT, component failure, need for revision  surgery, postop course, and   expectations were all   discussed and reviewed.  Consent was obtained for benefit of pain   relief.      PROCEDURE IN DETAIL:  The patient was brought to the operative theater.   Once adequate anesthesia, preoperative antibiotics, 2 gm of Ancef administered, the patient was positioned supine with the right thigh tourniquet placed.  The  right lower extremity was prepped and draped in sterile fashion.  A time-   out was performed identifying the patient, planned procedure, and   extremity.      The right lower extremity was placed in the [facility] leg holder.  The leg was   exsanguinated, tourniquet elevated to 250 mmHg.  A midline incision was   made followed by median parapatellar arthrotomy.  Following initial   exposure, attention was first  directed to the patella.  Precut   measurement was noted to be 25 mm.  I resected down to 14 mm and used a   41 patellar button to restore patellar height as well as cover the cut   surface.      The lug holes were drilled and a metal shim was placed to protect the   patella from retractors and saw blades.      At this point, attention was now directed to the femur.  The femoral   canal was opened with a drill, irrigated to try to prevent fat emboli.  An   intramedullary rod was passed at 5 degrees valgus, 10 mm of bone was   resected off the distal femur.  Following this resection, the tibia was   subluxated anteriorly.  Using the extramedullary guide, 2-3 mm of bone was resected off   the proximal medial tibia.  We confirmed the gap would be   stable medially and laterally with a 10 mm insert as well as confirmed   the cut was perpendicular in the coronal plane, checking with an alignment rod.      Once this was done, I sized the femur to be a size 5 in the anterior-   posterior dimension, chose a standard component based on medial and   lateral dimension.  The size 5 rotation block was then pinned in   position  anterior referenced using the C-clamp to set rotation.  The   anterior, posterior, and  chamfer cuts were made without difficulty nor   notching making certain that I was along the anterior cortex to help   with flexion gap stability.      The final box cut was made off the lateral aspect of distal femur.      At this point, the tibia was sized to be a size 5, the size 5 tray was   then pinned in position through the medial third of the tubercle,   drilled, and keel punched.  Trial reduction was now carried with a 5 femur,  5 tibia, a 12.5 mm PS insert, and the 41 patella botton.  The knee was brought to   extension, full extension with good flexion stability with the patella   tracking through the trochlea without application of pressure.  Given   all these findings, the trial components removed.  Final components were   opened and cement was mixed.  The knee was irrigated with normal saline   solution and pulse lavage.  The synovial lining was   then injected with 30cc of 0.25% Marcaine with epinephrine and 1 cc of Toradol,   total of 61 cc.      The knee was irrigated.  Final implants were then cemented onto clean and   dried cut surfaces of bone with the knee brought to extension with a 12.5 mm trial insert.      Once the cement had fully cured, the excess cement was removed   throughout the knee.  I confirmed I was satisfied with the range of   motion and stability, and the final 12.5 mm PS insert was chosen.  It was   placed into the knee.      The tourniquet had been let down at 34 minutes.  No significant   hemostasis required.  The   extensor mechanism was then reapproximated using #1 Vicryl with the knee   in flexion.  The   remaining wound was closed with  2-0 Vicryl and running 4-0 Monocryl.   The knee was cleaned, dried, dressed sterilely using Dermabond and   Aquacel dressing.  The patient was then   brought to recovery room in stable condition, tolerating the procedure    well.   Please note that Physician Assistant, Nehemiah Massed, PA-C, was present for the entirety of the case, and was utilized for pre-operative positioning, peri-operative retractor management, general facilitation of the procedure.  He was also utilized for primary wound closure at the end of the case.              Pietro Cassis Alvan Dame, M.D.    07/21/2014 12:43 PM

## 2014-07-21 NOTE — Transfer of Care (Signed)
Immediate Anesthesia Transfer of Care Note  Patient: Joshua [name]  Procedure(s) Performed: Procedure(s): TOTAL RIGHT KNEE ARTHROPLASTY (Right)  Patient Location: PACU  Anesthesia Type:MAC and Spinal  Level of Consciousness: Patient easily awoken, sedated, comfortable, cooperative, following commands, responds to stimulation.   Airway & Oxygen Therapy: Patient spontaneously breathing, ventilating well, oxygen via simple oxygen mask.  Post-op Assessment: Report given to PACU RN, vital signs reviewed and stable.   Post vital signs: Reviewed and stable.  Complications: No apparent anesthesia complications

## 2014-07-21 NOTE — Interval H&P Note (Signed)
History and Physical Interval Note:  07/21/2014 10:09 AM  Joshua [name]  has presented today for surgery, with the diagnosis of RIGHT KNEE OA   The various methods of treatment have been discussed with the patient and family. After consideration of risks, benefits and other options for treatment, the patient has consented to  Procedure(s): TOTAL RIGHT KNEE ARTHROPLASTY (Right) as a surgical intervention .  The patient's history has been reviewed, patient examined, no change in status, stable for surgery.  I have reviewed the patient's chart and labs.  Questions were answered to the patient's satisfaction.     Mauri Pole

## 2014-07-21 NOTE — Anesthesia Postprocedure Evaluation (Signed)
Anesthesia Post Note  Patient: Joshua [name]  Procedure(s) Performed: Procedure(s) (LRB): TOTAL RIGHT KNEE ARTHROPLASTY (Right)  Anesthesia type: Spinal  Patient location: PACU  Post pain: Pain level controlled  Post assessment: Post-op Vital signs reviewed  Last Vitals: BP 152/84  Pulse 61  Temp(Src) 36.5 C (Oral)  Resp 16  Ht 6\' 2"  (1.88 m)  Wt 185 lb 13.6 oz (84.3 kg)  BMI 23.85 kg/m2  SpO2 98%  Post vital signs: Reviewed  Level of consciousness: sedated  Complications: No apparent anesthesia complications

## 2014-07-22 LAB — BASIC METABOLIC PANEL
Anion gap: 10 (ref 5–15)
BUN: 14 mg/dL (ref 6–23)
CALCIUM: 8 mg/dL — AB (ref 8.4–10.5)
CO2: 25 mEq/L (ref 19–32)
CREATININE: 1.09 mg/dL (ref 0.50–1.35)
Chloride: 103 mEq/L (ref 96–112)
GFR calc non Af Amer: 67 mL/min — ABNORMAL LOW (ref >90)
GFR, EST AFRICAN AMERICAN: 78 mL/min — AB (ref >90)
Glucose, Bld: 127 mg/dL — ABNORMAL HIGH (ref 70–99)
Potassium: 4.2 mEq/L (ref 3.7–5.3)
Sodium: 138 mEq/L (ref 137–147)

## 2014-07-22 LAB — CBC
HEMATOCRIT: 33.9 % — AB (ref 39.0–52.0)
Hemoglobin: 11.6 g/dL — ABNORMAL LOW (ref 13.0–17.0)
MCH: 31.4 pg (ref 26.0–34.0)
MCHC: 34.2 g/dL (ref 30.0–36.0)
MCV: 91.6 fL (ref 78.0–100.0)
Platelets: 216 10*3/uL (ref 150–400)
RBC: 3.7 MIL/uL — ABNORMAL LOW (ref 4.22–5.81)
RDW: 13.1 % (ref 11.5–15.5)
WBC: 10.6 10*3/uL — AB (ref 4.0–10.5)

## 2014-07-22 MED ORDER — METHOCARBAMOL 500 MG PO TABS: 500.0000 mg | ORAL_TABLET | Freq: Four times a day (QID) | ORAL | Status: DC | PRN

## 2014-07-22 MED ORDER — POLYETHYLENE GLYCOL 3350 17 G PO PACK: 17.0000 g | PACK | Freq: Two times a day (BID) | ORAL | Status: DC

## 2014-07-22 MED ORDER — OXYCODONE HCL 5 MG PO TABS
5.0000 mg | ORAL_TABLET | ORAL | Status: DC
Start: 2014-07-22 — End: 2014-07-22
  Administered 2014-07-22: 10 mg via ORAL
  Administered 2014-07-22: 15 mg via ORAL
  Filled 2014-07-22 (×2): qty 3

## 2014-07-22 MED ORDER — ASPIRIN 325 MG PO TBEC
325.0000 mg | DELAYED_RELEASE_TABLET | Freq: Two times a day (BID) | ORAL | Status: AC
Start: 2014-07-22 — End: 2014-08-19

## 2014-07-22 MED ORDER — FERROUS SULFATE 325 (65 FE) MG PO TABS: 325.0000 mg | ORAL_TABLET | Freq: Three times a day (TID) | ORAL | Status: DC

## 2014-07-22 MED ORDER — OXYCODONE HCL 5 MG PO TABS: 5.0000 mg | ORAL_TABLET | ORAL | Status: DC | PRN

## 2014-07-22 MED ORDER — DSS 100 MG PO CAPS: 100.0000 mg | ORAL_CAPSULE | Freq: Two times a day (BID) | ORAL | Status: DC

## 2014-07-22 NOTE — Care Management Note (Signed)
    Page 1 of 2   07/22/2014     10:21:13 AM CARE MANAGEMENT NOTE 07/22/2014  Patient:  Joshua [name], Joshua [name]   Account Number:  [id number]  Date Initiated:  07/22/2014  Documentation initiated by:  [facility]  Subjective/Objective Assessment:   adm: TOTAL RIGHT KNEE ARTHROPLASTY (Right)     Action/Plan:   discharge planning   Anticipated DC Date:  07/22/2014   Anticipated DC Plan:  [place]  CM consult      [facility] Choice  HOME HEALTH   Choice offered to / List presented to:  C-1 Patient   DME arranged  3-N-1      DME agency  [place] arranged  [place]   Status of service:  Completed, signed off Medicare Important Message given?   (If response is "NO", the following Medicare IM given date fields will be blank) Date Medicare IM given:   Medicare IM given by:   Date Additional Medicare IM given:   Additional Medicare IM given by:    Discharge Disposition:  [place]  Per UR Regulation:    If discussed at Long Length of Stay Meetings, dates discussed:    Comments:  07/22/14 10:00 CM met with pt in room to offer choice of home health agency.  Pt confirms [facility] to render HHPT. address and contact information verified with pt.  DME rep to deliver 3n1 to room prior to discharge.  Referral called to [facility].  No other CM needs were communicated. Mariane Masters, BSN, CM [phone].

## 2014-07-22 NOTE — Progress Notes (Signed)
[facility] is providing the following services: commode (patient declined rw - has one at home)  If patient discharges after hours, please call [phone].   Linward Headland 07/22/2014, 9:21 AM

## 2014-07-22 NOTE — Progress Notes (Signed)
Physical Therapy Treatment Patient Details Name: Joshua [name] MRN: [id number] DOB: 01-09-1946 Today's Date: Jul 23, 2014    History of Present Illness s/p R TKA    PT Comments      Follow Up Recommendations  Home health PT     Equipment Recommendations  Rolling walker with 5" wheels    Recommendations for Other Services OT consult     Precautions / Restrictions Precautions Precautions: Knee Restrictions Weight Bearing Restrictions: No Other Position/Activity Restrictions: WBAT    Mobility  Bed Mobility                  Transfers Overall transfer level: Needs assistance Equipment used: Rolling walker (2 wheeled) Transfers: Sit to/from Stand Sit to Stand: Supervision         General transfer comment: min cues for LE management  Ambulation/Gait Ambulation/Gait assistance: Min guard;Supervision Ambulation Distance (Feet): 100 Feet Assistive device: Rolling walker (2 wheeled) Gait Pattern/deviations: Step-to pattern;Step-through pattern;Shuffle;Trunk flexed     General Gait Details: cues for sequence, posture and position from RW   Stairs Stairs: Yes Stairs assistance: Min assist Stair Management: No rails;Step to pattern;Backwards;With walker Number of Stairs: 4 General stair comments: cues for sequence, foot/RW placement - Wife assisting - written instructions provided  Wheelchair Mobility    Modified Rankin (Stroke Patients Only)       Balance                                    Cognition Arousal/Alertness: Awake/alert Behavior During Therapy: WFL for tasks assessed/performed Overall Cognitive Status: Within Functional Limits for tasks assessed                      Exercises      General Comments        Pertinent Vitals/Pain Pain Assessment: 0-10 Pain Score: 5  Pain Location: R knee Pain Descriptors / Indicators: Sore Pain Intervention(s): Limited activity within patient's tolerance;Monitored during  session;Patient requesting pain meds-RN notified;RN gave pain meds during session;Ice applied    Home Living                      Prior Function            PT Goals (current goals can now be found in the care plan section) Acute Rehab PT Goals Patient Stated Goal: Home to resume previous lifestyle with decreased pain PT Goal Formulation: With patient Time For Goal Achievement: 07/29/14 Potential to Achieve Goals: Good Progress towards PT goals: Progressing toward goals    Frequency  7X/week    PT Plan Current plan remains appropriate    Co-evaluation             End of Session Equipment Utilized During Treatment: Gait belt Activity Tolerance: Patient tolerated treatment well Patient left: in chair;with call bell/phone within reach;with family/visitor present     Time: 4765-4650 PT Time Calculation (min): 22 min  Charges:  $Gait Training: 8-22 mins                    G Codes:      Joshua [name] 07/23/14, 5:24 PM

## 2014-07-22 NOTE — Plan of Care (Signed)
Problem: Discharge Progression Outcomes Goal: Anticoagulant follow-up in place Outcome: Not Applicable Date Met:  28/20/81 asa

## 2014-07-22 NOTE — Evaluation (Signed)
Physical Therapy Evaluation Patient Details Name: Joshua [name] MRN: [id number] DOB: 09/07/1946 Today's Date: 07/22/2014   History of Present Illness  s/p R TKA  Clinical Impression  Pt s/p R TKR presents with decreased R LE strength/ROM and post op pain limiting functional mobility.  Pt should progress well to d/c home with family assist and HHPT follow up.    Follow Up Recommendations Home health PT    Equipment Recommendations  Rolling walker with 5" wheels    Recommendations for Other Services OT consult     Precautions / Restrictions Precautions Precautions: Knee Restrictions Weight Bearing Restrictions: No Other Position/Activity Restrictions: WBAT      Mobility  Bed Mobility Overal bed mobility: Needs Assistance Bed Mobility: Supine to Sit     Supine to sit: Min assist     General bed mobility comments: cues for sequence and use of L LE to self assist  Transfers Overall transfer level: Needs assistance Equipment used: Rolling walker (2 wheeled) Transfers: Sit to/from Stand Sit to Stand: Min guard         General transfer comment: for safety  Ambulation/Gait Ambulation/Gait assistance: Min assist;Min guard Ambulation Distance (Feet): 140 Feet Assistive device: Rolling walker (2 wheeled) Gait Pattern/deviations: Step-to pattern;Step-through pattern;Decreased step length - right;Decreased step length - left;Shuffle;Trunk flexed     General Gait Details: cues for sequence, posture and position from [employer]            Wheelchair Mobility    Modified Rankin (Stroke Patients Only)       Balance                                             Pertinent Vitals/Pain Pain Assessment: 0-10 Pain Score: 5  Pain Location: R knee Pain Descriptors / Indicators: Sore Pain Intervention(s): Limited activity within patient's tolerance;Monitored during session;Premedicated before session;Repositioned (removed ice)    Home Living  Family/patient expects to be discharged to:: Private residence Living Arrangements: Spouse/significant other Available Help at Discharge: Family Type of Home: House Home Access: Stairs to enter Entrance Stairs-Rails: None Technical brewer of Steps: 4 Home Layout: One level Home Equipment: Cane - single point Additional Comments: has built in seat in shower; 3:1 was delivered to room    Prior Function Level of Independence: Independent               Hand Dominance        Extremity/Trunk Assessment   Upper Extremity Assessment: Overall WFL for tasks assessed           Lower Extremity Assessment: RLE deficits/detail RLE Deficits / Details: 3-/5 quads with AAROM at knee -10 - 90    Cervical / Trunk Assessment: Normal  Communication   Communication: No difficulties  Cognition Arousal/Alertness: Awake/alert Behavior During Therapy: WFL for tasks assessed/performed Overall Cognitive Status: Within Functional Limits for tasks assessed (feels a little funny due to medication)                      General Comments      Exercises Total Joint Exercises Ankle Circles/Pumps: AROM;Both;10 reps;Supine Quad Sets: AROM;Both;10 reps;Supine Heel Slides: AAROM;Right;15 reps;Supine Straight Leg Raises: AAROM;Right;10 reps;Supine      Assessment/Plan    PT Assessment Patient needs continued PT services  PT Diagnosis Difficulty walking   PT Problem List Decreased strength;Decreased range of  motion;Decreased activity tolerance;Decreased mobility;Decreased knowledge of use of DME;Pain  PT Treatment Interventions DME instruction;Gait training;Stair training;Functional mobility training;Therapeutic activities;Therapeutic exercise;Patient/family education   PT Goals (Current goals can be found in the Care Plan section) Acute Rehab PT Goals Patient Stated Goal: Home to resume previous lifestyle with decreased pain PT Goal Formulation: With patient Time For Goal  Achievement: 07/29/14 Potential to Achieve Goals: Good    Frequency 7X/week   Barriers to discharge        Co-evaluation               End of Session Equipment Utilized During Treatment: Gait belt Activity Tolerance: Patient tolerated treatment well Patient left: in chair;with call bell/phone within reach;with family/visitor present Nurse Communication: Mobility status         Time: 8372-9021 PT Time Calculation (min): 32 min   Charges:   PT Evaluation $Initial PT Evaluation Tier I: 1 Procedure PT Treatments $Gait Training: 8-22 mins $Therapeutic Exercise: 8-22 mins   PT G Codes:          Catori Panozzo 07/22/2014, 2:16 PM

## 2014-07-22 NOTE — Evaluation (Signed)
Occupational Therapy Evaluation Patient Details Name: NESTA KIMPLE MRN: [id number] DOB: December 02, 1945 Today's Date: 07/22/2014    History of Present Illness s/p R TKA   Clinical Impression   This 68 year old man was admitted for the above surgery. All education was completed.  No further OT is needed at this time.      Follow Up Recommendations  No OT follow up    Equipment Recommendations  3 in 1 bedside comode (delivered)    Recommendations for Other Services       Precautions / Restrictions Precautions Precautions: Knee Restrictions Weight Bearing Restrictions: No      Mobility Bed Mobility                  Transfers Overall transfer level: Needs assistance Equipment used: Rolling walker (2 wheeled) Transfers: Sit to/from Stand Sit to Stand: Min guard         General transfer comment: for safety    Balance                                            ADL Overall ADL's : Needs assistance/impaired                         Toilet Transfer: Min guard;Ambulation       Tub/ Shower Transfer: Gaffer;Ambulation;Minimal assistance (assistance with walker to negotiate in small spaces)     General ADL Comments: Pt can perform UB adls with set up and pt needs min A for LB ADLs.  Practiced with sock aide, but sock barely clears heel.  Wife will assist with adls. Practiced shower transfer.  He has a built in seat at home; educated him and wife that he could also use 3:1 in shower if he wants a higher surface with armrests to push up from.  .     Vision                     Perception     Praxis      Pertinent Vitals/Pain Pain Assessment: 0-10 Pain Score: 5  Pain Location: R knee Pain Descriptors / Indicators: Sore Pain Intervention(s): Limited activity within patient's tolerance;Monitored during session;Premedicated before session;Repositioned (removed ice)     Hand Dominance     Extremity/Trunk  Assessment Upper Extremity Assessment Upper Extremity Assessment: Overall WFL for tasks assessed           Communication Communication Communication: No difficulties   Cognition Arousal/Alertness: Awake/alert Behavior During Therapy: WFL for tasks assessed/performed Overall Cognitive Status: Within Functional Limits for tasks assessed (feels a little funny due to medication)                     General Comments       Exercises       Shoulder Instructions      Home Living Family/patient expects to be discharged to:: Private residence Living Arrangements: Spouse/significant other                 Bathroom Shower/Tub: Occupational psychologist: Standard         Additional Comments: has built in seat in shower; 3:1 was delivered to room      Prior Functioning/Environment Level of Independence: Independent  OT Diagnosis: Generalized weakness   OT Problem List:     OT Treatment/Interventions:      OT Goals(Current goals can be found in the care plan section)    OT Frequency:     Barriers to D/C:            Co-evaluation              End of Session    Activity Tolerance: Patient tolerated treatment well Patient left: in chair;with call bell/phone within reach;with family/visitor present   Time:  - 1318 -1328 and 1349 - 1356  46minutes Charges:   1 EV, 1 [place] G-Codes:    Hina Gupta 2014/07/27, 2:01 PM Lesle Chris, OTR/L [phone] 27-Jul-2014

## 2014-07-22 NOTE — Progress Notes (Signed)
Discharged from floor via w/c, spouse with pt. No changes in assessment. Joshua [name]   

## 2014-07-22 NOTE — Progress Notes (Addendum)
     Subjective: 1 Day Post-Op Procedure(s) (LRB): TOTAL RIGHT KNEE ARTHROPLASTY (Right)   Patient reports pain as moderate, not full controlled. Discussed changing medicine. No events throughout the night. Ready to be discharged home, if his pain is controlled and does well with PT.   Objective:   VITALS:   Filed Vitals:   07/22/14 0552  BP: 125/84  Pulse: 60  Temp: 97.8 F (36.6 C)  Resp: 16    Dorsiflexion/Plantar flexion intact Incision: dressing C/D/I No cellulitis present Compartment soft  LABS  Recent Labs  07/22/14 0455  HGB 11.6*  HCT 33.9*  WBC 10.6*  PLT 216     Recent Labs  07/22/14 0455  NA 138  K 4.2  BUN 14  CREATININE 1.09  GLUCOSE 127*     Assessment/Plan: 1 Day Post-Op Procedure(s) (LRB): TOTAL RIGHT KNEE ARTHROPLASTY (Right) Foley cath d/c'ed Changed from Norco to Oxycodone Advance diet Up with therapy D/C IV fluids Discharge home with home health Follow up in 2 weeks at [facility] Follow up with [name],Dreonna Hussein D in 2 weeks.  Contact information:  [facility] [address] [place] Bet[place] Dizon   PAC  07/22/2014, 9:32 AM

## 2014-07-23 NOTE — Progress Notes (Signed)
Discharge summary sent to payer through [facility]  

## 2014-07-24 NOTE — Discharge Summary (Signed)
Physician Discharge Summary  Patient ID: Joshua [name] MRN: [id number] DOB/AGE: [birth year] 68 y.o.  Admit date: 07/21/2014 Discharge date: 07/22/2014   Procedures:  Procedure(s) (LRB): TOTAL RIGHT KNEE ARTHROPLASTY (Right)  Attending Physician:  Dr. Paralee Cancel   Admission Diagnoses:   Right knee OA / pain  Discharge Diagnoses:  Principal Problem:   S/P right TKA Active Problems:   S/P knee replacement  Past Medical History  Diagnosis Date  . Hyperlipidemia   . Diverticulosis of colon (without mention of hemorrhage)   . Chronic anxiety   . Chronic rhinitis   . Hx of vertigo   . PTSD (post-traumatic stress disorder)     [place] VETERAN  . DJD (degenerative joint disease)     OA AND PAIN RIGHT KNEE  . GERD (gastroesophageal reflux disease)     ONLY WITH CERTAIN FOODS  . TOS (thoracic outlet syndrome)     Seeing  a physical therapist--PAIN RIGHT SHOULDER- MUCH IMPROVED  . Claustrophobia     HPI:    Joshua [name], 68 y.o. male, has a history of pain and functional disability in the right knee due to arthritis and has failed non-surgical conservative treatments for greater than 12 weeks to includeNSAID's and/or analgesics, corticosteriod injections, viscosupplementation injections and activity modification. Onset of symptoms was gradual, starting 2+ years ago with gradually worsening course since that time. The patient noted prior procedures on the knee to include arthroscopy on the right knee(s). Patient currently rates pain in the right knee(s) at 8 out of 10 with activity. Patient has worsening of pain with activity and weight bearing, pain that interferes with activities of daily living, pain with passive range of motion, crepitus and joint swelling. Patient has evidence of periarticular osteophytes and joint space narrowing by imaging studies. There is no active infection. Risks, benefits and expectations were discussed with the patient. Risks including but not limited to  the risk of anesthesia, blood clots, nerve damage, blood vessel damage, failure of the prosthesis, infection and up to and including death. Patient understand the risks, benefits and expectations and wishes to proceed with surgery.   PCP: Christinia Gully, MD   Discharged Condition: good  Hospital Course:  Patient underwent the above stated procedure on 07/21/2014. Patient tolerated the procedure well and brought to the recovery room in good condition and subsequently to the floor.  POD #1 BP: 125/84 ; Pulse: 60 ; Temp: 97.8 F (36.6 C) ; Resp: 16 Patient reports pain as moderate, not full controlled. Discussed changing medicine. No events throughout the night. Ready to be discharged home. Dorsiflexion/plantar flexion intact, incision: dressing C/D/I, no cellulitis present and compartment soft.   LABS  Basename    HGB  11.6  HCT  33.9    Discharge Exam: General appearance: alert, cooperative and no distress Extremities: Homans sign is negative, no sign of DVT, no edema, redness or tenderness in the calves or thighs and no ulcers, gangrene or trophic changes  Disposition:    Home with follow up in 2 weeks   Follow-up Information   Follow up with Mauri Pole, MD. Schedule an appointment as soon as possible for a visit in 2 weeks.   Specialty:  Orthopedic Surgery   Contact information:   [address] [place] [address] [phone]       Follow up with [facility]. (home health physical therapy)    Contact information:   [place] [address] [phone]  Follow up with [facility]. (3n1 (commode))    Contact information:   [address] [place] [address] [phone]       Discharge Instructions   Call MD / Call 911    Complete by:  As directed   If you experience chest pain or shortness of breath, CALL 911 and be transported to the hospital emergency room.  If you develope a  fever above 101 F, pus (white drainage) or increased drainage or redness at the wound, or calf pain, call your surgeon's office.     Change dressing    Complete by:  As directed   Maintain surgical dressing for 10-14 days, or until follow up in the clinic.     Constipation Prevention    Complete by:  As directed   Drink plenty of fluids.  Prune juice may be helpful.  You may use a stool softener, such as Colace (over the counter) 100 mg twice a day.  Use MiraLax (over the counter) for constipation as needed.     Diet - low sodium heart healthy    Complete by:  As directed      Discharge instructions    Complete by:  As directed   Maintain surgical dressing for 10-14 days, or until follow up in the clinic. Follow up in 2 weeks at [facility]. Call with any questions or concerns.     Driving restrictions    Complete by:  As directed   No driving for 4 weeks     Increase activity slowly as tolerated    Complete by:  As directed      TED hose    Complete by:  As directed   Use stockings (TED hose) for 2 weeks on both leg(s).  You may remove them at night for sleeping.     Weight bearing as tolerated    Complete by:  As directed   Laterality:  right  Extremity:  Lower             Medication List    STOP taking these medications       aspirin 81 MG tablet  Replaced by:  aspirin 325 MG EC tablet     ibuprofen 200 MG tablet  Commonly known as:  ADVIL,MOTRIN      TAKE these medications       ALPRAZolam 0.5 MG tablet  Commonly known as:  XANAX  Take 0.5 mg by mouth. RARELY TAKES FOR CLAUSTROPHOBIA OR PTSD     aspirin 325 MG EC tablet  Take 1 tablet (325 mg total) by mouth 2 (two) times daily.     DSS 100 MG Caps  Take 100 mg by mouth 2 (two) times daily.     ferrous sulfate 325 (65 FE) MG tablet  Take 1 tablet (325 mg total) by mouth 3 (three) times daily after meals.     methocarbamol 500 MG tablet  Commonly known as:  ROBAXIN  Take 1 tablet (500 mg total)  by mouth every 6 (six) hours as needed for muscle spasms.     NASACORT ALLERGY 24HR NA  Place 1 spray into the nose daily.     oxyCODONE 5 MG immediate release tablet  Commonly known as:  Oxy IR/ROXICODONE  Take 1-3 tablets (5-15 mg total) by mouth every 4 (four) hours as needed for severe pain.     polyethylene glycol packet  Commonly known as:  MIRALAX / GLYCOLAX  Take 17  g by mouth 2 (two) times daily.         Signed: [place]. Malee Grays   PA-C  07/24/2014, 4:35 PM

## 2015-07-16 ENCOUNTER — Telehealth: Payer: Self-pay | Admitting: Internal Medicine

## 2015-07-16 ENCOUNTER — Ambulatory Visit (INDEPENDENT_AMBULATORY_CARE_PROVIDER_SITE_OTHER): Payer: Medicare Other | Admitting: Internal Medicine

## 2015-07-16 ENCOUNTER — Encounter: Payer: Self-pay | Admitting: Internal Medicine

## 2015-07-16 ENCOUNTER — Other Ambulatory Visit (INDEPENDENT_AMBULATORY_CARE_PROVIDER_SITE_OTHER): Payer: Medicare Other

## 2015-07-16 VITALS — BP 132/80 | HR 70 | Ht 72.0 in | Wt 193.0 lb

## 2015-07-16 DIAGNOSIS — I10 Essential (primary) hypertension: Secondary | ICD-10-CM

## 2015-07-16 DIAGNOSIS — J310 Chronic rhinitis: Secondary | ICD-10-CM

## 2015-07-16 DIAGNOSIS — G4700 Insomnia, unspecified: Secondary | ICD-10-CM

## 2015-07-16 DIAGNOSIS — E785 Hyperlipidemia, unspecified: Secondary | ICD-10-CM

## 2015-07-16 DIAGNOSIS — Z23 Encounter for immunization: Secondary | ICD-10-CM | POA: Diagnosis not present

## 2015-07-16 DIAGNOSIS — M1711 Unilateral primary osteoarthritis, right knee: Secondary | ICD-10-CM

## 2015-07-16 DIAGNOSIS — M179 Osteoarthritis of knee, unspecified: Secondary | ICD-10-CM

## 2015-07-16 LAB — CBC WITH DIFFERENTIAL/PLATELET
BASOS ABS: 0 10*3/uL (ref 0.0–0.1)
BASOS PCT: 0.5 % (ref 0.0–3.0)
Eosinophils Absolute: 0.3 10*3/uL (ref 0.0–0.7)
Eosinophils Relative: 5.8 % — ABNORMAL HIGH (ref 0.0–5.0)
HEMATOCRIT: 43.8 % (ref 39.0–52.0)
HEMOGLOBIN: 14.8 g/dL (ref 13.0–17.0)
LYMPHS PCT: 29.8 % (ref 12.0–46.0)
Lymphs Abs: 1.6 10*3/uL (ref 0.7–4.0)
MCHC: 33.7 g/dL (ref 30.0–36.0)
MCV: 92.5 fl (ref 78.0–100.0)
MONO ABS: 0.6 10*3/uL (ref 0.1–1.0)
Monocytes Relative: 11.1 % (ref 3.0–12.0)
Neutro Abs: 2.8 10*3/uL (ref 1.4–7.7)
Neutrophils Relative %: 52.8 % (ref 43.0–77.0)
Platelets: 238 10*3/uL (ref 150.0–400.0)
RBC: 4.73 Mil/uL (ref 4.22–5.81)
RDW: 13.7 % (ref 11.5–15.5)
WBC: 5.4 10*3/uL (ref 4.0–10.5)

## 2015-07-16 LAB — BASIC METABOLIC PANEL
BUN: 22 mg/dL (ref 6–23)
CALCIUM: 9.1 mg/dL (ref 8.4–10.5)
CO2: 26 mEq/L (ref 19–32)
Chloride: 106 mEq/L (ref 96–112)
Creatinine, Ser: 1.12 mg/dL (ref 0.40–1.50)
GFR: 68.89 mL/min (ref >60.00)
Glucose, Bld: 93 mg/dL (ref 70–99)
Potassium: 4.7 mEq/L (ref 3.5–5.1)
SODIUM: 138 meq/L (ref 135–145)

## 2015-07-16 LAB — URINALYSIS
BILIRUBIN URINE: NEGATIVE
HGB URINE DIPSTICK: NEGATIVE
KETONES UR: NEGATIVE
LEUKOCYTES UA: NEGATIVE
Nitrite: NEGATIVE
SPECIFIC GRAVITY, URINE: 1.01 (ref 1.000–1.030)
Total Protein, Urine: NEGATIVE
URINE GLUCOSE: NEGATIVE
UROBILINOGEN UA: 0.2 (ref 0.0–1.0)
pH: 6 (ref 5.0–8.0)

## 2015-07-16 LAB — HEPATIC FUNCTION PANEL
ALK PHOS: 59 U/L (ref 39–117)
ALT: 17 U/L (ref 0–53)
AST: 19 U/L (ref 0–37)
Albumin: 4 g/dL (ref 3.5–5.2)
Bilirubin, Direct: 0.1 mg/dL (ref 0.0–0.3)
TOTAL PROTEIN: 7.3 g/dL (ref 6.0–8.3)
Total Bilirubin: 0.7 mg/dL (ref 0.2–1.2)

## 2015-07-16 LAB — LIPID PANEL
Cholesterol: 201 mg/dL — ABNORMAL HIGH (ref 0–200)
HDL: 45.1 mg/dL (ref >39.00)
LDL CALC: 143 mg/dL — AB (ref 0–99)
NONHDL: 155.64
Total CHOL/HDL Ratio: 4
Triglycerides: 64 mg/dL (ref 0.0–149.0)
VLDL: 12.8 mg/dL (ref 0.0–40.0)

## 2015-07-16 LAB — TSH: TSH: 2.64 u[IU]/mL (ref 0.35–4.50)

## 2015-07-16 NOTE — Assessment & Plan Note (Addendum)
-   Ideal LDL < 130 former smoker, Type A, father with IHD in 60s  Lab Results  Component Value Date   CHOL 201* 07/16/2015   HDL 45.10 07/16/2015   LDLCALC 143* 07/16/2015   LDLDIRECT 143.3 01/24/2012   TRIG 64.0 07/16/2015   CHOLHDL 4 07/16/2015     LDL Trending up but still has a very good HDL. Advised on diet as he is already exercising at a high level

## 2015-07-16 NOTE — Telephone Encounter (Signed)
Patient notified of lab results. Nothing further needed.  

## 2015-07-16 NOTE — Patient Instructions (Signed)
Eliminate caffeine as much as you can  If you wake up and can't go back to sleep read for 30 minutes by dull light  Please remember to go to the lab   department downstairs for your tests - we will call you with the results when they are available.  Tetanus and Prevnar today  Please schedule a follow up visit in 12 months but call sooner if needed for cpx

## 2015-07-16 NOTE — Progress Notes (Signed)
Subjective:     Patient ID: Joshua [name]., male   DOB: 08/17/1946   MRN: [id number]   Brief patient profile:  69 yowm quit smoking mid 20's follwed here for DJD and hyperlipidemia.   History of Present Illness  03/04/2013 f/u ov/Joshua [name] comprehensive eval for shoulder pains/ intermittent rhinitis Chief Complaint  Patient presents with  . Annual Exam    Pt doing well and denies any co's today  re R shoulder pain: no longer responding to meloxicam and wants to try celebrex Confused with how to use zyrtec/ has sensation pnds assoc with occ dry cough/ mostly seasonal spring > fall rec Ok to try celebrex 200 mg up to twice daily meals as needed for shoulder pain if you find it helps > if not, return to physical therapy Zyrtec 10 mg one at bedtime as needed for itching sneezing water eyes and call if not effective for trial of dymista (would be a daily maintenance med)   07/16/2015  f/u ov/Joshua [name] re: chronic rhinitis djd/ hyperlipidemia/ new= insomnia  Chief Complaint  Patient presents with  . Annual Exam    Pt is fasting. He c/o having trouble staying asleep. He has tried xanax and this helps, but he feels a little groggy the following day. He does admits to napping during the day.     Goes to sleep fine, wakes up around MN and can't get back to sleep/ only caffeine in system is am coffee  X 2 weeks / no decongestants  Works out regularly with no exertional chest pain GI or claudication symptoms/no limiting joint pains  No obvious day to day or daytime variability in ex tol or cough or assoc  chest tightness, subjective wheeze or overt sinus or hb symptoms. No unusual exp hx or h/o childhood pna/ asthma or knowledge of premature birth.  Also denies any obvious fluctuation of symptoms with weather or environmental changes or other aggravating or alleviating factors except as outlined above   Current Medications, Allergies, Complete Past Medical History, Past Surgical History, Family History,  and Social History were reviewed in [employer] [employer] record.  ROS  The following are not active complaints unless bolded sore throat, dysphagia, dental problems, itching, sneezing,  nasal congestion or excess/ purulent secretions, ear ache,   fever, chills, sweats, unintended wt loss, classically pleuritic or exertional cp, hemoptysis,  orthopnea pnd or leg swelling, presyncope, palpitations, abdominal pain, anorexia, nausea, vomiting, diarrhea  or change in bowel or bladder habits, change in stools or urine, dysuria,hematuria,  rash, arthralgias, visual complaints, headache, numbness, weakness or ataxia or problems with walking or coordination,  change in mood/affect or memory.            Past Medical History:  HYPERLIPIDEMIA (ICD-272.4)  - Target LDL < 68 male gender, pos fm hx  DEGENERATIVE JOINT DISEASE (ICD-715.90) - s/p R TKR   07/21/14 ..........................................................................  Joshua [name]  DIVERTICULOSIS OF COLON (ICD-562.10)........................................Marland KitchenSharlett [name]  - Colonoscopy 04/20/03 and 02/2008  CHRONIC RHINITIS (ICD-472.0)  ANXIETY, CHRONIC (ICD-300.00) > PTSD [facility]  HEADACHE (ICD-784.0)  Hx of VERTIGO (ICD-780.4)  GERD  - Pos EGD 1991 for GERD with 3-4 cm HH  Health Maintenance............................................................Marland KitchenWert  - DT 11/2004  07/16/2015   - Pneumovax November 10, 2010 ,  Prevnar 13 07/16/2015  - CPX 07/16/2015    Family History:  heart disease in father age 28's  colon cancer paternal side uncle and aunt  anxiety in sister/ hypochondriac per pt  No prostate ca  Social History:  Patient states former smoker, quit around [place]- retired  Works out at [employer] and volunteers at [employer]            Objective:   Physical Exam  wt 195 > 197 October 27, 2008 > 194 August 05, 2009 > 189 December 16, 2009 > 192 November 10, 2010  >  01/24/2012   191> 03/04/2013  192>  198 10/01/13 > 07/16/2015 193   GEN: A/Ox3; pleasant , NAD  HEENT: [place]/AT, , EACs-clear, TMs-wnl, NOSE-pale, nontender sinus , THROAT-clear  NECK: Supple w/ fair ROM; no JVD; normal carotid impulses w/o bruits; no thyromegaly or nodules palpated; no lymphadenopathy.  CHEST: Clear to P & A; w/o, wheezes/ rales/ or rhonchi.  HEART: RRR, no m/r/g  ABDOMEN: Soft & nt; nml bowel sounds; no organomegaly or masses detected.  EXT: Warm bil, no calf pain, edema, clubbing, pulses intact   MS nl gait and station, no obvious joint restriction/ swelling - R knee scar  Skin no lesions GU  Nl/ no IH Rectal moderate ext hem, nl prostate, stool g neg      Labs ordered/ reviewed:    Lab 07/16/15 1016  NA 138  K 4.7  CL 106  CO2 26  BUN 22  CREATININE 1.12  GLUCOSE 93   Lab 07/16/15 1016  HGB 14.8  HCT 43.8  WBC 5.4  PLT 238.0     Lab Results  Component Value Date   TSH 2.64 07/16/2015            Assessment:

## 2015-07-18 ENCOUNTER — Encounter: Payer: Self-pay | Admitting: Internal Medicine

## 2015-07-18 DIAGNOSIS — G4700 Insomnia, unspecified: Secondary | ICD-10-CM | POA: Insufficient documentation

## 2015-07-18 NOTE — Assessment & Plan Note (Addendum)
Adequate control on present rx, reviewed > no change in rx needed  = salt avoidance, exercise working well

## 2015-07-18 NOTE — Assessment & Plan Note (Signed)
Adequate control on present rx, reviewed > no change in rx needed  = otcs s decongestants reviewed

## 2015-07-18 NOTE — Assessment & Plan Note (Signed)
Right total knee replacement 07/21/2014    He is doing exceptionally well status post knee replacement and not limited by any joints at this point nor needing much in terms of otc nsaids

## 2015-07-18 NOTE — Assessment & Plan Note (Signed)
The pattern is one of awakening in the middle of night and not being able to get back to sleep. I reviewed with him optimal strategies to reduce his dependency on Xanax but will not fill more than one Xanax per day as there is a risk of tachyphylaxis here.

## 2015-08-26 ENCOUNTER — Telehealth: Payer: Self-pay | Admitting: Internal Medicine

## 2015-08-26 MED ORDER — CELECOXIB 200 MG PO CAPS: 200.0000 mg | ORAL_CAPSULE | Freq: Every day | ORAL | Status: DC

## 2015-08-26 NOTE — Telephone Encounter (Signed)
Patient returned call, can be reached at [phone]

## 2015-08-26 NOTE — Telephone Encounter (Signed)
Called spoke with pt. Aware of below. celebrex called in. Nothing further needed

## 2015-08-26 NOTE — Telephone Encounter (Signed)
The celebrex is ok x12 but the doxy is not something I prescibe like this and should come from his dermatologist

## 2015-08-26 NOTE — Telephone Encounter (Signed)
lmomtcb x1 

## 2015-08-26 NOTE — Telephone Encounter (Signed)
error 

## 2015-08-26 NOTE — Telephone Encounter (Signed)
Pt advised at last OV to call back with refills of medications needed. Pt states that he needs his Celebrex 200mg  refilled and an Rx for Doxycycline 100mg  - 1po QD #30 (Dermatological)  Please advise Dr Melvyn Novas if okay to refill. Thanks.    [facility]

## 2015-09-13 ENCOUNTER — Other Ambulatory Visit: Payer: Self-pay | Admitting: Neurosurgery

## 2015-09-13 DIAGNOSIS — M5412 Radiculopathy, cervical region: Secondary | ICD-10-CM

## 2015-09-16 ENCOUNTER — Ambulatory Visit
Admission: RE | Admit: 2015-09-16 | Discharge: 2015-09-16 | Disposition: A | Discharge status: Home / Self Care | Payer: Medicare Other | Source: Ambulatory Visit | Attending: Neurosurgery | Admitting: Neurosurgery

## 2015-09-16 DIAGNOSIS — M5412 Radiculopathy, cervical region: Secondary | ICD-10-CM

## 2015-09-16 MED ORDER — IOHEXOL 300 MG/ML  SOLN
10.0000 mL | Freq: Once | INTRAMUSCULAR | Status: AC | PRN
  Administered 2015-09-16: 10 mL via INTRATHECAL

## 2015-09-16 MED ORDER — DIAZEPAM 5 MG PO TABS
5.0000 mg | ORAL_TABLET | Freq: Once | ORAL | Status: AC
  Administered 2015-09-16: 5 mg via ORAL

## 2015-09-16 NOTE — Discharge Instructions (Signed)

## 2015-11-18 ENCOUNTER — Other Ambulatory Visit: Payer: Self-pay | Admitting: Neurosurgery

## 2015-11-29 ENCOUNTER — Encounter (HOSPITAL_COMMUNITY): Payer: Self-pay

## 2015-11-29 ENCOUNTER — Encounter (HOSPITAL_COMMUNITY)
Admission: RE | Admit: 2015-11-29 | Discharge: 2015-11-29 | Disposition: A | Discharge status: Home / Self Care | Payer: Medicare Other | Source: Ambulatory Visit | Attending: Neurosurgery | Admitting: Neurosurgery

## 2015-11-29 DIAGNOSIS — M4800 Spinal stenosis, site unspecified: Secondary | ICD-10-CM | POA: Insufficient documentation

## 2015-11-29 DIAGNOSIS — Z01812 Encounter for preprocedural laboratory examination: Secondary | ICD-10-CM | POA: Insufficient documentation

## 2015-11-29 LAB — BASIC METABOLIC PANEL
Anion gap: 13 (ref 5–15)
BUN: 11 mg/dL (ref 6–20)
CHLORIDE: 105 mmol/L (ref 101–111)
CO2: 25 mmol/L (ref 22–32)
CREATININE: 1.11 mg/dL (ref 0.61–1.24)
Calcium: 9.3 mg/dL (ref 8.9–10.3)
GFR calc non Af Amer: >60 mL/min (ref >60)
Glucose, Bld: 96 mg/dL (ref 65–99)
Potassium: 4.2 mmol/L (ref 3.5–5.1)
Sodium: 143 mmol/L (ref 135–145)

## 2015-11-29 LAB — CBC WITH DIFFERENTIAL/PLATELET
Basophils Absolute: 0 10*3/uL (ref 0.0–0.1)
Basophils Relative: 0 %
EOS ABS: 0.2 10*3/uL (ref 0.0–0.7)
Eosinophils Relative: 2 %
HCT: 42.2 % (ref 39.0–52.0)
HEMOGLOBIN: 14.1 g/dL (ref 13.0–17.0)
LYMPHS ABS: 1.9 10*3/uL (ref 0.7–4.0)
Lymphocytes Relative: 24 %
MCH: 30.9 pg (ref 26.0–34.0)
MCHC: 33.4 g/dL (ref 30.0–36.0)
MCV: 92.3 fL (ref 78.0–100.0)
MONOS PCT: 9 %
Monocytes Absolute: 0.7 10*3/uL (ref 0.1–1.0)
NEUTROS PCT: 65 %
Neutro Abs: 5.1 10*3/uL (ref 1.7–7.7)
Platelets: 251 10*3/uL (ref 150–400)
RBC: 4.57 MIL/uL (ref 4.22–5.81)
RDW: 13.9 % (ref 11.5–15.5)
WBC: 7.9 10*3/uL (ref 4.0–10.5)

## 2015-11-29 LAB — SURGICAL PCR SCREEN
MRSA, PCR: NEGATIVE
Staphylococcus aureus: NEGATIVE

## 2015-11-29 NOTE — Progress Notes (Signed)
Pt. Followed by Dr. Melvyn Novas for PCP, pt. Reports > 15 yrs. + ago he thinks that he had a stress test, which ended up being wnl, no follow up needed with cardiology.

## 2015-11-29 NOTE — Pre-Procedure Instructions (Signed)
[place] [facility]  11/29/2015      [id number] [address], [address] [place] [address] Phone: [phone] [phone]    Your procedure is scheduled on 11/30/2015  Report to [facility] Admitting at 0900 A.M.  Call this number if you have problems the morning of surgery:  [phone]   Remember:  Do not eat food or drink liquids after midnight.  TONIGHT   Take these medicines the morning of surgery with A SIP OF WATER : Zantac if needed, is OK   Do not wear jewelry   Do not wear lotions, powders, or perfumes.  You may NOT wear deodorant.    Men may shave face and neck.   Do not bring valuables to the hospital.   [facility] is not responsible for any belongings or valuables.  Contacts, dentures or bridgework may not be worn into surgery.  Leave your suitcase in the car.  After surgery it may be brought to your room.  For patients admitted to the hospital, discharge time will be determined by your treatment team.  Patients discharged the day of surgery will not be allowed to drive home.   Name and phone number of your driver:   With wife  Special instructions:  Special Instructions: [facility] - Preparing for Surgery  Before surgery, you can play an important role.  Because skin is not sterile, your skin needs to be as free of germs as possible.  You can reduce the number of germs on you skin by washing with CHG (chlorahexidine gluconate) soap before surgery.  CHG is an antiseptic cleaner which kills germs and bonds with the skin to continue killing germs even after washing.  Please DO NOT use if you have an allergy to CHG or antibacterial soaps.  If your skin becomes reddened/irritated stop using the CHG and inform your nurse when you arrive at Short Stay.  Do not shave (including legs and underarms) for at least 48 hours prior to the first CHG shower.  You may shave your face.  Please follow these  instructions carefully:   1.  Shower with CHG Soap the night before surgery and the  morning of Surgery.  2.  If you choose to wash your hair, wash your hair first as usual with your  normal shampoo.  3.  After you shampoo, rinse your hair and body thoroughly to remove the  Shampoo.  4.  Use CHG as you would any other liquid soap.  You can apply chg directly to the skin and wash gently with scrungie or a clean washcloth.  5.  Apply the CHG Soap to your body ONLY FROM THE NECK DOWN.    Do not use on open wounds or open sores.  Avoid contact with your eyes, ears, mouth and genitals (private parts).  Wash genitals (private parts)   with your normal soap.  6.  Wash thoroughly, paying special attention to the area where your surgery will be performed.  7.  Thoroughly rinse your body with warm water from the neck down.  8.  DO NOT shower/wash with your normal soap after using and rinsing off   the CHG Soap.  9.  Pat yourself dry with a clean towel.            10.  Wear clean pajamas.            11.  Place clean sheets on your bed  the night of your first shower and do not sleep with pets.  Day of Surgery  Do not apply any lotions/deodorants the morning of surgery.  Please wear clean clothes to the hospital/surgery center.  Please read over the following fact sheets that you were given. Pain Booklet, Coughing and Deep Breathing, MRSA Information and Surgical Site Infection Prevention

## 2015-11-30 ENCOUNTER — Inpatient Hospital Stay (HOSPITAL_COMMUNITY): Payer: Medicare Other | Admitting: Certified Registered Nurse Anesthetist

## 2015-11-30 ENCOUNTER — Inpatient Hospital Stay (HOSPITAL_COMMUNITY): Payer: Medicare Other

## 2015-11-30 ENCOUNTER — Inpatient Hospital Stay (HOSPITAL_COMMUNITY)
Admission: RE | Admit: 2015-11-30 | Discharge: 2015-11-30 | DRG: 473 | Disposition: A | Discharge status: Home Health | Payer: Medicare Other | Source: Ambulatory Visit | Attending: Neurosurgery | Admitting: Neurosurgery

## 2015-11-30 ENCOUNTER — Encounter (HOSPITAL_COMMUNITY)
Admission: RE | Disposition: A | Discharge status: Home Health | Payer: Self-pay | Source: Ambulatory Visit | Attending: Neurosurgery

## 2015-11-30 ENCOUNTER — Encounter (HOSPITAL_COMMUNITY): Payer: Self-pay | Admitting: General Practice

## 2015-11-30 DIAGNOSIS — M542 Cervicalgia: Secondary | ICD-10-CM | POA: Diagnosis present

## 2015-11-30 DIAGNOSIS — F4310 Post-traumatic stress disorder, unspecified: Secondary | ICD-10-CM | POA: Diagnosis present

## 2015-11-30 DIAGNOSIS — Z87891 Personal history of nicotine dependence: Secondary | ICD-10-CM | POA: Diagnosis not present

## 2015-11-30 DIAGNOSIS — Z981 Arthrodesis status: Secondary | ICD-10-CM

## 2015-11-30 DIAGNOSIS — F419 Anxiety disorder, unspecified: Secondary | ICD-10-CM | POA: Diagnosis present

## 2015-11-30 DIAGNOSIS — Z79899 Other long term (current) drug therapy: Secondary | ICD-10-CM | POA: Diagnosis not present

## 2015-11-30 DIAGNOSIS — M5033 Other cervical disc degeneration, cervicothoracic region: Secondary | ICD-10-CM | POA: Diagnosis present

## 2015-11-30 DIAGNOSIS — Z7982 Long term (current) use of aspirin: Secondary | ICD-10-CM

## 2015-11-30 DIAGNOSIS — Z96651 Presence of right artificial knee joint: Secondary | ICD-10-CM | POA: Diagnosis present

## 2015-11-30 DIAGNOSIS — I10 Essential (primary) hypertension: Secondary | ICD-10-CM | POA: Diagnosis present

## 2015-11-30 DIAGNOSIS — K219 Gastro-esophageal reflux disease without esophagitis: Secondary | ICD-10-CM | POA: Diagnosis present

## 2015-11-30 DIAGNOSIS — E785 Hyperlipidemia, unspecified: Secondary | ICD-10-CM | POA: Diagnosis present

## 2015-11-30 DIAGNOSIS — M4722 Other spondylosis with radiculopathy, cervical region: Secondary | ICD-10-CM | POA: Diagnosis present

## 2015-11-30 DIAGNOSIS — Z419 Encounter for procedure for purposes other than remedying health state, unspecified: Secondary | ICD-10-CM

## 2015-11-30 DIAGNOSIS — M4802 Spinal stenosis, cervical region: Secondary | ICD-10-CM | POA: Diagnosis present

## 2015-11-30 HISTORY — PX: ANTERIOR CERVICAL DECOMP/DISCECTOMY FUSION: SHX1161

## 2015-11-30 SURGERY — ANTERIOR CERVICAL DECOMPRESSION/DISCECTOMY FUSION 1 LEVEL
Anesthesia: General | Site: Spine Cervical

## 2015-11-30 MED ORDER — DEXAMETHASONE SODIUM PHOSPHATE 10 MG/ML IJ SOLN
INTRAMUSCULAR | Status: AC
  Filled 2015-11-30: qty 1

## 2015-11-30 MED ORDER — ROCURONIUM BROMIDE 100 MG/10ML IV SOLN
INTRAVENOUS | Status: DC | PRN
  Administered 2015-11-30: 50 mg via INTRAVENOUS

## 2015-11-30 MED ORDER — CEFAZOLIN SODIUM-DEXTROSE 2-3 GM-% IV SOLR
2.0000 g | INTRAVENOUS | Status: AC
  Administered 2015-11-30: 2 g via INTRAVENOUS

## 2015-11-30 MED ORDER — LORATADINE 10 MG PO TABS: 10.0000 mg | ORAL_TABLET | Freq: Every day | ORAL | Status: DC

## 2015-11-30 MED ORDER — SODIUM CHLORIDE 0.9% FLUSH: 3.0000 mL | INTRAVENOUS | Status: DC | PRN

## 2015-11-30 MED ORDER — EPHEDRINE SULFATE 50 MG/ML IJ SOLN
INTRAMUSCULAR | Status: AC
  Filled 2015-11-30: qty 1

## 2015-11-30 MED ORDER — PROPOFOL 10 MG/ML IV BOLUS
INTRAVENOUS | Status: DC | PRN
  Administered 2015-11-30: 200 mg via INTRAVENOUS

## 2015-11-30 MED ORDER — ACETAMINOPHEN 325 MG PO TABS: 650.0000 mg | ORAL_TABLET | ORAL | Status: DC | PRN

## 2015-11-30 MED ORDER — SODIUM CHLORIDE 0.9% FLUSH
3.0000 mL | Freq: Two times a day (BID) | INTRAVENOUS | Status: DC
Start: 2015-11-30 — End: 2015-11-30

## 2015-11-30 MED ORDER — CEFAZOLIN SODIUM-DEXTROSE 2-3 GM-% IV SOLR
INTRAVENOUS | Status: AC
  Filled 2015-11-30: qty 50

## 2015-11-30 MED ORDER — DEXAMETHASONE SODIUM PHOSPHATE 10 MG/ML IJ SOLN: 10.0000 mg | INTRAMUSCULAR | Status: DC

## 2015-11-30 MED ORDER — PHENOL 1.4 % MT LIQD: 1.0000 | OROMUCOSAL | Status: DC | PRN

## 2015-11-30 MED ORDER — ONDANSETRON HCL 4 MG/2ML IJ SOLN
INTRAMUSCULAR | Status: DC | PRN
  Administered 2015-11-30: 4 mg via INTRAVENOUS

## 2015-11-30 MED ORDER — CYCLOBENZAPRINE HCL 10 MG PO TABS: 10.0000 mg | ORAL_TABLET | Freq: Three times a day (TID) | ORAL | Status: DC | PRN

## 2015-11-30 MED ORDER — SUGAMMADEX SODIUM 200 MG/2ML IV SOLN
INTRAVENOUS | Status: DC | PRN
  Administered 2015-11-30: 176 mg via INTRAVENOUS

## 2015-11-30 MED ORDER — PROPOFOL 10 MG/ML IV BOLUS
INTRAVENOUS | Status: AC
  Filled 2015-11-30: qty 20

## 2015-11-30 MED ORDER — ONDANSETRON HCL 4 MG/2ML IJ SOLN
INTRAMUSCULAR | Status: AC
  Filled 2015-11-30: qty 2

## 2015-11-30 MED ORDER — ROCURONIUM BROMIDE 50 MG/5ML IV SOLN
INTRAVENOUS | Status: AC
  Filled 2015-11-30: qty 1

## 2015-11-30 MED ORDER — HYDROCODONE-ACETAMINOPHEN 5-325 MG PO TABS: 1.0000 | ORAL_TABLET | ORAL | Status: DC | PRN

## 2015-11-30 MED ORDER — MIDAZOLAM HCL 2 MG/2ML IJ SOLN
INTRAMUSCULAR | Status: AC
  Filled 2015-11-30: qty 2

## 2015-11-30 MED ORDER — LACTATED RINGERS IV SOLN
INTRAVENOUS | Status: DC
  Administered 2015-11-30 (×2): via INTRAVENOUS

## 2015-11-30 MED ORDER — LIDOCAINE HCL (CARDIAC) 20 MG/ML IV SOLN
INTRAVENOUS | Status: AC
  Filled 2015-11-30: qty 5

## 2015-11-30 MED ORDER — LACTATED RINGERS IV SOLN: INTRAVENOUS | Status: DC

## 2015-11-30 MED ORDER — HYDROMORPHONE HCL 1 MG/ML IJ SOLN: 0.2500 mg | INTRAMUSCULAR | Status: DC | PRN

## 2015-11-30 MED ORDER — SODIUM CHLORIDE 0.9 % IR SOLN
Status: DC | PRN
  Administered 2015-11-30: 11:00:00

## 2015-11-30 MED ORDER — SUCCINYLCHOLINE CHLORIDE 20 MG/ML IJ SOLN
INTRAMUSCULAR | Status: AC
  Filled 2015-11-30: qty 1

## 2015-11-30 MED ORDER — ASPIRIN 81 MG PO CHEW: 81.0000 mg | CHEWABLE_TABLET | Freq: Every day | ORAL | Status: DC

## 2015-11-30 MED ORDER — HYDROCODONE-ACETAMINOPHEN 5-325 MG PO TABS
1.0000 | ORAL_TABLET | ORAL | Status: DC | PRN
  Administered 2015-11-30: 1 via ORAL
  Filled 2015-11-30: qty 1

## 2015-11-30 MED ORDER — 0.9 % SODIUM CHLORIDE (POUR BTL) OPTIME
TOPICAL | Status: DC | PRN
  Administered 2015-11-30: 1000 mL

## 2015-11-30 MED ORDER — SODIUM CHLORIDE 0.9 % IJ SOLN
INTRAMUSCULAR | Status: AC
  Filled 2015-11-30: qty 10

## 2015-11-30 MED ORDER — OXYCODONE-ACETAMINOPHEN 5-325 MG PO TABS: 1.0000 | ORAL_TABLET | ORAL | Status: DC | PRN

## 2015-11-30 MED ORDER — CEFAZOLIN SODIUM-DEXTROSE 2-3 GM-% IV SOLR
2.0000 g | Freq: Three times a day (TID) | INTRAVENOUS | Status: DC
  Administered 2015-11-30: 2 g via INTRAVENOUS
  Filled 2015-11-30: qty 50

## 2015-11-30 MED ORDER — FENTANYL CITRATE (PF) 100 MCG/2ML IJ SOLN
INTRAMUSCULAR | Status: DC | PRN
  Administered 2015-11-30 (×2): 50 ug via INTRAVENOUS
  Administered 2015-11-30: 150 ug via INTRAVENOUS

## 2015-11-30 MED ORDER — MENTHOL 3 MG MT LOZG: 1.0000 | LOZENGE | OROMUCOSAL | Status: DC | PRN

## 2015-11-30 MED ORDER — SUGAMMADEX SODIUM 200 MG/2ML IV SOLN
INTRAVENOUS | Status: AC
  Filled 2015-11-30: qty 2

## 2015-11-30 MED ORDER — HYDROMORPHONE HCL 1 MG/ML IJ SOLN: 0.5000 mg | INTRAMUSCULAR | Status: DC | PRN

## 2015-11-30 MED ORDER — CELECOXIB 200 MG PO CAPS: 200.0000 mg | ORAL_CAPSULE | Freq: Every day | ORAL | Status: DC

## 2015-11-30 MED ORDER — ACETAMINOPHEN 650 MG RE SUPP: 650.0000 mg | RECTAL | Status: DC | PRN

## 2015-11-30 MED ORDER — FENTANYL CITRATE (PF) 250 MCG/5ML IJ SOLN
INTRAMUSCULAR | Status: AC
  Filled 2015-11-30: qty 5

## 2015-11-30 MED ORDER — ONDANSETRON HCL 4 MG/2ML IJ SOLN: 4.0000 mg | INTRAMUSCULAR | Status: DC | PRN

## 2015-11-30 MED ORDER — THROMBIN 5000 UNITS EX SOLR
CUTANEOUS | Status: DC | PRN
  Administered 2015-11-30 (×2): 5000 [IU] via TOPICAL

## 2015-11-30 MED ORDER — HEMOSTATIC AGENTS (NO CHARGE) OPTIME
TOPICAL | Status: DC | PRN
  Administered 2015-11-30: 1 via TOPICAL

## 2015-11-30 MED ORDER — LIDOCAINE HCL (CARDIAC) 20 MG/ML IV SOLN
INTRAVENOUS | Status: DC | PRN
  Administered 2015-11-30: 60 mg via INTRAVENOUS

## 2015-11-30 MED ORDER — EPHEDRINE SULFATE 50 MG/ML IJ SOLN
INTRAMUSCULAR | Status: DC | PRN
  Administered 2015-11-30: 5 mg via INTRAVENOUS

## 2015-11-30 SURGICAL SUPPLY — 48 items
BAG DECANTER FOR FLEXI CONT (MISCELLANEOUS) ×2 IMPLANT
BENZOIN TINCTURE PRP APPL 2/3 (GAUZE/BANDAGES/DRESSINGS) ×2 IMPLANT
BIT DRILL 13 (BIT) ×2 IMPLANT
BRUSH SCRUB EZ PLAIN DRY (MISCELLANEOUS) ×2 IMPLANT
BUR MATCHSTICK NEURO 3.0 LAGG (BURR) ×2 IMPLANT
CAGE PEEK 9X14X11 (Cage) ×2 IMPLANT
CANISTER SUCT 3000ML PPV (MISCELLANEOUS) ×2 IMPLANT
DRAPE C-ARM 42X72 X-RAY (DRAPES) ×4 IMPLANT
DRAPE LAPAROTOMY 100X72 PEDS (DRAPES) ×2 IMPLANT
DRAPE MICROSCOPE LEICA (MISCELLANEOUS) ×2 IMPLANT
DRAPE POUCH INSTRU U-SHP 10X18 (DRAPES) ×2 IMPLANT
DRSG OPSITE POSTOP 3X4 (GAUZE/BANDAGES/DRESSINGS) ×2 IMPLANT
DURAPREP 6ML APPLICATOR 50/CS (WOUND CARE) ×2 IMPLANT
ELECT COATED BLADE 2.86 ST (ELECTRODE) ×2 IMPLANT
ELECT REM PT RETURN 9FT ADLT (ELECTROSURGICAL) ×2
ELECTRODE REM PT RTRN 9FT ADLT (ELECTROSURGICAL) ×1 IMPLANT
GAUZE SPONGE 4X4 12PLY STRL (GAUZE/BANDAGES/DRESSINGS) ×2 IMPLANT
GAUZE SPONGE 4X4 16PLY XRAY LF (GAUZE/BANDAGES/DRESSINGS) IMPLANT
GLOVE BIOGEL PI IND STRL 7.0 (GLOVE) ×3 IMPLANT
GLOVE BIOGEL PI INDICATOR 7.0 (GLOVE) ×3
GLOVE ECLIPSE 9.0 STRL (GLOVE) ×2 IMPLANT
GLOVE SURG SS PI 6.5 STRL IVOR (GLOVE) ×6 IMPLANT
GOWN STRL REUS W/ TWL LRG LVL3 (GOWN DISPOSABLE) ×1 IMPLANT
GOWN STRL REUS W/ TWL XL LVL3 (GOWN DISPOSABLE) ×3 IMPLANT
GOWN STRL REUS W/TWL 2XL LVL3 (GOWN DISPOSABLE) IMPLANT
GOWN STRL REUS W/TWL LRG LVL3 (GOWN DISPOSABLE) ×1
GOWN STRL REUS W/TWL XL LVL3 (GOWN DISPOSABLE) ×3
HALTER HD/CHIN CERV TRACTION D (MISCELLANEOUS) ×2 IMPLANT
KIT BASIN OR (CUSTOM PROCEDURE TRAY) ×2 IMPLANT
KIT ROOM TURNOVER OR (KITS) ×2 IMPLANT
NEEDLE SPNL 20GX3.5 QUINCKE YW (NEEDLE) ×2 IMPLANT
NS IRRIG 1000ML POUR BTL (IV SOLUTION) ×2 IMPLANT
PACK LAMINECTOMY NEURO (CUSTOM PROCEDURE TRAY) ×2 IMPLANT
PAD ARMBOARD 7.5X6 YLW CONV (MISCELLANEOUS) ×6 IMPLANT
PLATE ELITE VISION 25MM (Plate) ×2 IMPLANT
RUBBERBAND STERILE (MISCELLANEOUS) ×4 IMPLANT
SCREW ST 13X4XST VA NS SPNE (Screw) ×4 IMPLANT
SCREW ST VAR 4 ATL (Screw) ×4 IMPLANT
SPONGE INTESTINAL PEANUT (DISPOSABLE) ×2 IMPLANT
SPONGE SURGIFOAM ABS GEL SZ50 (HEMOSTASIS) ×2 IMPLANT
STRIP CLOSURE SKIN 1/2X4 (GAUZE/BANDAGES/DRESSINGS) ×2 IMPLANT
SUT VIC AB 3-0 SH 8-18 (SUTURE) ×2 IMPLANT
SUT VIC AB 4-0 RB1 18 (SUTURE) ×2 IMPLANT
TAPE CLOTH 4X10 WHT NS (GAUZE/BANDAGES/DRESSINGS) ×2 IMPLANT
TOWEL OR 17X24 6PK STRL BLUE (TOWEL DISPOSABLE) ×2 IMPLANT
TOWEL OR 17X26 10 PK STRL BLUE (TOWEL DISPOSABLE) ×2 IMPLANT
TRAP SPECIMEN MUCOUS 40CC (MISCELLANEOUS) ×2 IMPLANT
WATER STERILE IRR 1000ML POUR (IV SOLUTION) ×2 IMPLANT

## 2015-11-30 NOTE — Discharge Instructions (Signed)

## 2015-11-30 NOTE — Anesthesia Preprocedure Evaluation (Addendum)
Anesthesia Evaluation  Patient identified by MRN, date of birth, ID band Patient awake    Reviewed: Allergy & Precautions, H&P , NPO status , Patient's Chart, lab work & pertinent test results  Airway Mallampati: II  TM Distance: >3 FB Neck ROM: Full    Dental  (+) Dental Advisory Given, Caps All upper front are capped:   Pulmonary neg pulmonary ROS, former smoker,    Pulmonary exam normal breath sounds clear to auscultation       Cardiovascular hypertension, Normal cardiovascular exam Rhythm:Regular Rate:Normal     Neuro/Psych  Headaches, PSYCHIATRIC DISORDERS Anxiety PTSD. ClaustrophobiaVertigo. Thoracic outlet syndrome  Neuromuscular disease    GI/Hepatic Neg liver ROS, GERD  ,  Endo/Other  negative endocrine ROS  Renal/GU negative Renal ROS     Musculoskeletal  (+) Arthritis ,   Abdominal   Peds  Hematology negative hematology ROS (+)   Anesthesia Other Findings   Reproductive/Obstetrics                            Anesthesia Physical Anesthesia Plan  ASA: II  Anesthesia Plan: General   Post-op Pain Management:    Induction: Intravenous  Airway Management Planned: Oral ETT  Additional Equipment:   Intra-op Plan:   Post-operative Plan: Extubation in OR  Informed Consent:   Plan Discussed with: Surgeon  Anesthesia Plan Comments:         Anesthesia Quick Evaluation

## 2015-11-30 NOTE — H&P (Signed)
Joshua [name] is an 70 y.o. male.   Chief Complaint: Neck and bilateral arm pain HPI: 70 year old male status post previous multilevel anterior cervical decompression infusion presents with bilateral C8 nerve root distribution pain , numbness and weakness. Workup demonstrates evidence of progressive disc degeneration with spondylosis and foraminal stenosis at C7-T1. Patient presents now for C7-T1 anterior cervical discectomy and fusion in hopes of improving his symptoms.  Past Medical History  Diagnosis Date  . Hyperlipidemia   . Diverticulosis of colon (without mention of hemorrhage)   . Chronic anxiety   . Chronic rhinitis   . Hx of vertigo   . PTSD (post-traumatic stress disorder)     [place] V[employer]  . DJD (degenerative joint disease)     OA AND PAIN RIGHT KNEE  . GERD (gastroesophageal reflux disease)     ONLY WITH CERTAIN FOODS  . TOS (thoracic outlet syndrome)     Seeing  a physical therapist--PAIN RIGHT SHOULDER- MUCH IMPROVED  . Claustrophobia     Past Surgical History  Procedure Laterality Date  . Cervical disc surgery  2009    c3-4 5-6/ by Dr Joshua [name]- FUSION  . Colonoscopy    . Tonsillectomy      AS A CHILD  . Total knee arthroplasty Right 07/21/2014    Procedure: TOTAL RIGHT KNEE ARTHROPLASTY;  Surgeon: Joshua Pole, MD;  Location: [facility]S;  Service: Orthopedics;  Laterality: Right;  . Eye surgery      blepheroplasty - bilateral  . Joint replacement Right     knee    Family History  Problem Relation Age of Onset  . Colon cancer Paternal Aunt    Social History:  reports that he quit smoking about 42 years ago. His smoking use included Cigarettes. He has a 2.5 pack-year smoking history. He has never used smokeless tobacco. He reports that he drinks about 3.5 oz of alcohol per week. He reports that he does not use illicit drugs.  Allergies: No Known Allergies  Medications Prior to Admission  Medication Sig Dispense Refill  . aspirin 81 MG tablet Take 81 mg by  mouth daily.    Marland Kitchen loratadine (CLARITIN) 10 MG tablet Take 10 mg by mouth daily.    . Multiple Vitamins-Minerals (MULTIVITAMIN PO) Take 1 tablet by mouth daily.    . celecoxib (CELEBREX) 200 MG capsule Take 1 capsule (200 mg total) by mouth daily. (Patient not taking: Reported on 11/25/2015) 30 capsule 12    Results for orders placed or performed during the hospital encounter of 11/29/15 (from the past 48 hour(s))  Basic metabolic panel     Status: None   Collection Time: 11/29/15  3:14 PM  Result Value Ref Range   Sodium 143 135 - 145 mmol/L   Potassium 4.2 3.5 - 5.1 mmol/L   Chloride 105 101 - 111 mmol/L   CO2 25 22 - 32 mmol/L   Glucose, Bld 96 65 - 99 mg/dL   BUN 11 6 - 20 mg/dL   Creatinine, Ser 1.11 0.61 - 1.24 mg/dL   Calcium 9.3 8.9 - 10.3 mg/dL   GFR calc non Af Amer >60 >60 mL/min   GFR calc Af Amer >60 >60 mL/min    Comment: (NOTE) The eGFR has been calculated using the CKD EPI equation. This calculation has not been validated in all clinical situations. eGFR's persistently <60 mL/min signify possible Chronic Kidney Disease.    Anion gap 13 5 - 15  CBC WITH DIFFERENTIAL  Status: None   Collection Time: 11/29/15  3:14 PM  Result Value Ref Range   WBC 7.9 4.0 - 10.5 K/uL   RBC 4.57 4.22 - 5.81 MIL/uL   Hemoglobin 14.1 13.0 - 17.0 g/dL   HCT 42.2 39.0 - 52.0 %   MCV 92.3 78.0 - 100.0 fL   MCH 30.9 26.0 - 34.0 pg   MCHC 33.4 30.0 - 36.0 g/dL   RDW 13.9 11.5 - 15.5 %   Platelets 251 150 - 400 K/uL   Neutrophils Relative % 65 %   Neutro Abs 5.1 1.7 - 7.7 K/uL   Lymphocytes Relative 24 %   Lymphs Abs 1.9 0.7 - 4.0 K/uL   Monocytes Relative 9 %   Monocytes Absolute 0.7 0.1 - 1.0 K/uL   Eosinophils Relative 2 %   Eosinophils Absolute 0.2 0.0 - 0.7 K/uL   Basophils Relative 0 %   Basophils Absolute 0.0 0.0 - 0.1 K/uL  Surgical pcr screen     Status: None   Collection Time: 11/29/15  3:14 PM  Result Value Ref Range   MRSA, PCR NEGATIVE NEGATIVE   Staphylococcus  aureus NEGATIVE NEGATIVE    Comment:        The Xpert SA Assay (FDA approved for NASAL specimens in patients over 37 years of age), is one component of a comprehensive surveillance program.  Test performance has been validated by [facility] for patients greater than or equal to 80 year old. It is not intended to diagnose infection nor to guide or monitor treatment.    No results found.  Pertinent items noted in HPI and remainder of comprehensive ROS otherwise negative.  Blood pressure 139/73, pulse 63, temperature 97.5 F (36.4 C), temperature source Oral, resp. rate 20, height 6' 1" (1.854 m), weight 87.998 kg (194 lb), SpO2 98 %.  The patient is awake and alert. He is oriented and appropriate. Speech is fluent. His judgment and insight are intact. Cranial nerve function is intact. Motor examination reveals intrinsic weakness in both hands. Decreased sensation bilateral C8 dermatomes. Deep and reveals normal active. No evidence of long track signs. Gait and posterior normal. Examination head ears eyes nose and throat is unremarkable. Chest and abdomen are benign. Extremities are free from injury or deformity. Assessment/Plan  C7-T1 spondylosis with stenosis and radiculopathy. Plan C7-T1 anterior cervical discectomy with interbody fusion utilizing interbody peek cage, locally harvested autograft, and anterior plate instrumentation. Risks and benefits of been explained. Patient wishes to proceed.  Joshua [name] A 11/30/2015, 9:45 AM

## 2015-11-30 NOTE — Anesthesia Procedure Notes (Signed)
Procedure Name: Intubation Date/Time: 11/30/2015 10:42 AM Performed by: Trixie Deis A Pre-anesthesia Checklist: Patient identified, Timeout performed, Emergency Drugs available, Suction available and Patient being monitored Patient Re-evaluated:Patient Re-evaluated prior to inductionOxygen Delivery Method: Circle system utilized Preoxygenation: Pre-oxygenation with 100% oxygen Intubation Type: IV induction Ventilation: Mask ventilation without difficulty and Oral airway inserted - appropriate to patient size Laryngoscope Size: Glidescope and 4 Grade View: Grade I Tube type: Oral Tube size: 7.5 mm Number of attempts: 1 Airway Equipment and Method: Rigid stylet and Video-laryngoscopy Placement Confirmation: ETT inserted through vocal cords under direct vision,  breath sounds checked- equal and bilateral and positive ETCO2 Secured at: 22 (lip) cm Tube secured with: Tape Dental Injury: Teeth and Oropharynx as per pre-operative assessment  Difficulty Due To: Difficulty was anticipated and Difficult Airway- due to reduced neck mobility

## 2015-11-30 NOTE — Transfer of Care (Signed)
Immediate Anesthesia Transfer of Care Note  Patient: Joshua [name]  Procedure(s) Performed: Procedure(s): CERVICAL SEVEN-THORACIC ONE ANTERIOR CERVICAL DISCECTOMY/FUSION (N/A)  Patient Location: PACU  Anesthesia Type:General  Level of Consciousness: awake, alert  and oriented  Airway & Oxygen Therapy: Patient Spontanous Breathing and Patient connected to nasal cannula oxygen  Post-op Assessment: Report given to RN, Post -op Vital signs reviewed and stable and Patient moving all extremities  Post vital signs: Reviewed and stable  Last Vitals:  Filed Vitals:   11/30/15 0857  BP: 139/73  Pulse: 63  Temp: 36.4 C  Resp: 20    Complications: No apparent anesthesia complications

## 2015-11-30 NOTE — Brief Op Note (Signed)
11/30/2015  12:16 PM  PATIENT:  Joshua [name]  71 y.o. male  PRE-OPERATIVE DIAGNOSIS:  Stenosis  POST-OPERATIVE DIAGNOSIS:  Stenosis  PROCEDURE:  Procedure(s): CERVICAL SEVEN-THORACIC ONE ANTERIOR CERVICAL DISCECTOMY/FUSION (N/A)  SURGEON:  Surgeon(s) and Role:    * Earnie Larsson, MD - Primary    * Leeroy Cha, MD - Assisting  PHYSICIAN ASSISTANT:   ASSISTANTS:    ANESTHESIA:   general  EBL:  Total I/O In: 800 [I.V.:800] Out: -   BLOOD ADMINISTERED:none  DRAINS: none   LOCAL MEDICATIONS USED:  NONE  SPECIMEN:  No Specimen  DISPOSITION OF SPECIMEN:  N/A  COUNTS:  YES  TOURNIQUET:  * No tourniquets in log *  DICTATION: .Dragon Dictation  PLAN OF CARE: Admit to inpatient   PATIENT DISPOSITION:  PACU - hemodynamically stable.   Delay start of Pharmacological VTE agent (>24hrs) due to surgical blood loss or risk of bleeding: yes

## 2015-11-30 NOTE — Anesthesia Postprocedure Evaluation (Signed)
Anesthesia Post Note  Patient: Joshua [name]  Procedure(s) Performed: Procedure(s) (LRB): CERVICAL SEVEN-THORACIC ONE ANTERIOR CERVICAL DISCECTOMY/FUSION (N/A)  Patient location during evaluation: PACU Anesthesia Type: General Level of consciousness: awake and alert Pain management: pain level controlled Vital Signs Assessment: post-procedure vital signs reviewed and stable Respiratory status: spontaneous breathing, nonlabored ventilation, respiratory function stable and patient connected to nasal cannula oxygen Cardiovascular status: blood pressure returned to baseline and stable Postop Assessment: no signs of nausea or vomiting Anesthetic complications: no    Last Vitals:  Filed Vitals:   11/30/15 1317 11/30/15 1335  BP:  151/79  Pulse: 68 70  Temp: 36.1 C 36.4 C  Resp: 17 16    Last Pain:  Filed Vitals:   11/30/15 1351  PainSc: 8                  Devion Chriscoe L

## 2015-11-30 NOTE — Op Note (Signed)
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  Asst. Surgeon: Joya Salm  Anesthesia: General  Indication: 70 year old male with bilateral C8 radiculopathies. Previous fusions from C3-C7. Workup demonstrates evidence of significant spondylosis and stenosis at C7-T1. Patient presents now for anterior cervical decompression and fusion in hopes of improving his symptoms.  Operative note: After induction of anesthesia, patient positioned supine with neck slightly extended and held in place of halter traction. Anterior cervical region prepped and draped sterilely. Incision made overlying C7-T1. Dissection proceeds down to the platysma. Platysma was then divided vertically and dissection proceeds along the medial border of the sternocleidomastoid muscle and carotid sheath. Trachea and esophagus were mobilized towards the right. Prevertebral fascia stripped off the anterior spinal column. Longus cole muscles elevated bilaterally using electrocautery. Deep self retaining retractors placed intraoperative for [place] is used levels were confirmed. Disc space at C7-T1 was incised 15 blade. Discectomy then performed using various instruments down to level posterior annulus. Microscope brought into the field use at the remainder of the discectomy. Remaining aspects of annulus and osteophytes removed down to the level posterior lotion ligament. Posterior longitudinal limb was then elevated and resected piecemeal fashion. Underlying thecal sac was identified. Wide central decompression then performed by undercutting the bodies of C7 and T1. Decompression then proceeded  into each neural foramen. Wide anterior foraminotomies performed on course exiting C8 nerve roots bilaterally. At this point a very thorough decompression had been achieved. There was no evidence of injury to the thecal sac and nerve roots. Wound was then irrigated with and bike solution. Gelfoam was placed topically for hemostasis and removed. A 9 mm Medtronic anatomic peek cage was packed with locally harvested autograft and impacted into place. This was recessed slightly from the anterior cortical margin. 25 mm Atlantis anterior cervical plate was then placed over the C7 and T1 level. This an attachment or fluoroscopic guidance using 13 mm variable-angle screws 2 each at both levels. All 4 screws were given a final tightening found to be solidly within the bone. Locking screws engaged both levels. Final images revealed good position the bone graft and hardware at proper upper level with normal alignment of the spine. Wound is then irrigated one final time. Hemostasis is achieved with by bipolar electrocautery. Wounds and close in layers. Steri-Strips and sterile dressing applied. No apparent complications. Patient tolerated the procedure well and he returns to the recovery room postop.

## 2015-11-30 NOTE — Discharge Summary (Signed)
Physician Discharge Summary  Patient ID: Joshua [name] MRN: [id number] DOB/AGE: [birth year] 70 y.o.  Admit date: 11/30/2015 Discharge date: 11/30/2015  Admission Diagnoses:  Discharge Diagnoses:  Active Problems:   Cervical spondylosis with radiculopathy   Discharged Condition: good  Hospital Course: Patient admitted to the hospital where he underwent an uncomplicated [id number] ACDF with good results. Postoperatively his neck and upper extremity pain are much improved. Strength and sensation also much improved. Ambulating and ready for discharge home.  Consults:   Significant Diagnostic Studies:   Treatments:   Discharge Exam: Blood pressure 142/76, pulse 83, temperature 98 F (36.7 C), temperature source Oral, resp. rate 16, height 6\' 1"  (1.854 m), weight 87.998 kg (194 lb), SpO2 99 %. Awake and alert. Oriented and appropriate. Cranial nerve function intact. Motor sensory function extremities normal. Wound clean and dry. Chest and abdomen benign.  Disposition: 06-Home-Health Care Svc     Medication List    TAKE these medications        aspirin 81 MG tablet  Take 81 mg by mouth daily.     celecoxib 200 MG capsule  Commonly known as:  CELEBREX  Take 1 capsule (200 mg total) by mouth daily.     cyclobenzaprine 10 MG tablet  Commonly known as:  FLEXERIL  Take 1 tablet (10 mg total) by mouth 3 (three) times daily as needed for muscle spasms.     HYDROcodone-acetaminophen 5-325 MG tablet  Commonly known as:  NORCO/VICODIN  Take 1-2 tablets by mouth every 4 (four) hours as needed (mild pain).     loratadine 10 MG tablet  Commonly known as:  CLARITIN  Take 10 mg by mouth daily.     MULTIVITAMIN PO  Take 1 tablet by mouth daily.           Follow-up Information    Follow up with Charlie Pitter, MD.   Specialty:  Neurosurgery   Contact information:   [address] [place] [address] [phone]       Signed: Charlie Pitter 11/30/2015, 4:46  PM

## 2015-11-30 NOTE — Progress Notes (Signed)
Patient alert and oriented, mae's well, voiding adequate amount of urine, swallowing without difficulty, c/o mild pain and meds given prior to discharged. Patient discharged home with family. Script and discharged instructions given to patient. Patient and family stated understanding of instructions given.  

## 2015-12-01 ENCOUNTER — Encounter (HOSPITAL_COMMUNITY): Payer: Self-pay | Admitting: Neurosurgery

## 2016-07-17 ENCOUNTER — Telehealth: Payer: Self-pay | Admitting: Internal Medicine

## 2016-07-18 ENCOUNTER — Ambulatory Visit: Payer: Medicare Other | Admitting: Internal Medicine

## 2016-07-19 NOTE — Telephone Encounter (Signed)
Error

## 2016-12-13 ENCOUNTER — Ambulatory Visit (INDEPENDENT_AMBULATORY_CARE_PROVIDER_SITE_OTHER): Payer: Medicare Other | Admitting: Internal Medicine

## 2016-12-13 ENCOUNTER — Encounter: Payer: Self-pay | Admitting: Internal Medicine

## 2016-12-13 VITALS — BP 124/80 | HR 73 | Temp 98.8°F | Ht 72.0 in | Wt 197.2 lb

## 2016-12-13 DIAGNOSIS — R05 Cough: Secondary | ICD-10-CM

## 2016-12-13 DIAGNOSIS — J310 Chronic rhinitis: Secondary | ICD-10-CM

## 2016-12-13 DIAGNOSIS — R059 Cough, unspecified: Secondary | ICD-10-CM

## 2016-12-13 MED ORDER — PREDNISONE 10 MG PO TABS: ORAL_TABLET | ORAL | 0 refills | Status: DC

## 2016-12-13 MED ORDER — TRAMADOL HCL 50 MG PO TABS: ORAL_TABLET | ORAL | 0 refills | Status: DC

## 2016-12-13 MED ORDER — AZITHROMYCIN 250 MG PO TABS: ORAL_TABLET | ORAL | 0 refills | Status: DC

## 2016-12-13 NOTE — Progress Notes (Signed)
Subjective:     Patient ID: Joshua [name]., male   DOB: August 24, 1946   MRN: [id number]   Brief patient profile:  72 yowm quit smoking mid 20's follwed in pulmonary clinic for rhinitis/ recurrent bronchitis   History of Present Illness  12/13/2016 acute extended ov/Joshua [name] re: "my usual flare this time of year"  Chief Complaint  Patient presents with  . Acute Visit    cold symptoms, runny nose, coughed all night, denies fever, denies SOB  rhinitis x sev weeks clariton helped some then much worse with cough/ congestion esp at hs and early am no sob and denies excess/ purulent sputum or mucus plugs    No obvious day to day or daytime variability or assoc   cp or chest tightness, subjective wheeze or overt sinus or hb symptoms. No unusual exp hx or h/o childhood pna/ asthma or knowledge of premature birth.  Sleeping ok without nocturnal  or early am exacerbation  of respiratory  c/o's or need for noct saba. Also denies any obvious fluctuation of symptoms with weather or environmental changes or other aggravating or alleviating factors except as outlined above   Current Medications, Allergies, Complete Past Medical History, Past Surgical History, Family History, and Social History were reviewed in [employer] record.  ROS  The following are not active complaints unless bolded sore throat, dysphagia, dental problems, itching, sneezing,  nasal congestion or excess/ purulent secretions, ear ache,   fever, chills, sweats, unintended wt loss, classically pleuritic or exertional cp, hemoptysis,  orthopnea pnd or leg swelling, presyncope, palpitations, abdominal pain, anorexia, nausea, vomiting, diarrhea  or change in bowel or bladder habits, change in stools or urine, dysuria,hematuria,  rash, arthralgias, visual complaints, headache, numbness, weakness or ataxia or problems with walking or coordination,  change in mood/affect or memory.                     Past Medical  History:  HYPERLIPIDEMIA (ICD-272.4)  - Target LDL < 21 male gender, pos fm hx  DEGENERATIVE JOINT DISEASE (ICD-715.90) - s/p R TKR   07/21/14 ..........................................................................  Joshua [name]  DIVERTICULOSIS OF COLON (ICD-562.10)........................................Marland KitchenSharlett [name]  - Colonoscopy 04/20/03 and 02/2008  CHRONIC RHINITIS (ICD-472.0)  ANXIETY, CHRONIC (ICD-300.00) > PTSD per [facility]  HEADACHE (ICD-784.0)  Hx of VERTIGO (ICD-780.4)  GERD  - Pos EGD 1991 for GERD with 3-4 cm HH  Health Maintenance..........................................................   Joshua [name] as of 2018  - DT 11/2004  07/16/2015   - Pneumovax November 10, 2010 ,  Prevnar 13 07/16/2015  - CPX 07/16/2015    Family History:  heart disease in father age 42's  colon cancer paternal side uncle and aunt  anxiety in sister/ hypochondriac per pt  No prostate ca   Social History:  Patient states former smoker, quit around [place]- retired  Works out at [employer] and volunteers at [employer]            Objective:   Physical Exam  wt 195 > 197 October 27, 2008 > 194 August 05, 2009 > 189 December 16, 2009 > 192 November 10, 2010  >  01/24/2012  191> 03/04/2013  192 >  198 10/01/13 > 07/16/2015 193 >  12/13/2016  197     GEN: A/Ox3; pleasant , NAD  HEENT: Doyle/AT, , EACs-clear, TMs-wnl, NOSE-pale, nontender sinus , THROAT-clear  - moderate bilateral non-specific turbinate edema  s purulent secretions NECK: Supple w/ fair ROM; no JVD;  normal carotid impulses w/o bruits; no thyromegaly or nodules palpated; no lymphadenopathy.  CHEST: Minimal insp and exp rhonchi bilaterally but mostly transmitted from upper airway  HEART: RRR, no m/r/g  ABDOMEN: Soft & nt; nml bowel sounds; no organomegaly or masses detected.  EXT: Warm bil, no calf pain, edema, clubbing, pulses intact   MS nl gait and station, no obvious joint restriction/ swelling   Skin no lesions                  Assessment:

## 2016-12-13 NOTE — Patient Instructions (Addendum)
I emphasized that nasal steroids (like flonase)  have no immediate benefit in terms of improving symptoms.  To help them reached the target tissue, the patient should use Afrin two puffs every 12 hours applied one min before using the nasal steroids.  Afrin should be stopped after no more than 5 days.  If the symptoms worsen, Afrin can be restarted after 5 days off of therapy to prevent rebound congestion from overuse of Afrin.  I also emphasized that in no way are nasal steroids a concern in terms of "addiction".   Try prilosec otc 20mg   Take 30-60 min before first meal of the day and Pepcid ac (famotidine) 20 mg one @  bedtime until cough is completely gone for at least a week without the need for cough suppression  GERD (REFLUX)  is an extremely common cause of respiratory symptoms just like yours , many times with no obvious heartburn at all.    It can be treated with medication, but also with lifestyle changes including elevation of the head of your bed (ideally with 6 inch  bed blocks),  Smoking cessation, avoidance of late meals, excessive alcohol, and avoid fatty foods, chocolate, peppermint, colas, red wine, and acidic juices such as orange juice.  NO MINT OR MENTHOL PRODUCTS SO NO COUGH DROPS   USE SUGARLESS CANDY INSTEAD (Jolley ranchers or Stover's or Life Savers) or even ice chips will also do - the key is to swallow to prevent all throat clearing. NO OIL BASED VITAMINS - use powdered substitutes.  Prednisone 10 mg take  4 each am x 2 days,   2 each am x 2 days,  1 each am x 2 days and stop   zpak   FOR DRIPPY NOSE:   Zyrtec instead clariton  FOR COUGH >   Take delsym two tsp every 12 hours and supplement if needed with  tramadol 50 mg up to 2 every 4 hours to suppress the urge to cough. Swallowing water or using ice chips/non mint and menthol containing candies (such as lifesavers or sugarless jolly ranchers) are also effective.  You should rest your voice and avoid activities that you  know make you cough.  Once you have eliminated the cough for 3 straight days try reducing the tramadol first,  then the delsym as tolerated.    Pulmonary follow up is as needed

## 2016-12-14 NOTE — Assessment & Plan Note (Signed)
GERD  - Pos EGD 1991 for GERD with 3-4 cm HH  Classic Upper airway cough syndrome (previously labeled PNDS) , is  so named because it's frequently impossible to sort out how much is  CR/sinusitis with freq throat clearing (which can be related to primary GERD)   vs  causing  secondary (" extra esophageal")  GERD from wide swings in gastric pressure that occur with throat clearing, often  promoting self use of mint and menthol lozenges that reduce the lower esophageal sphincter tone and exacerbate the problem further in a cyclical fashion.   These are the same pts (now being labeled as having "irritable larynx syndrome" by some cough centers) who not infrequently have a history of having failed to tolerate ace inhibitors,  dry powder inhalers or biphosphonates or report having atypical/extraesophageal reflux symptoms that don't respond to standard doses of PPI  and are easily confused as having aecopd or asthma flares by even experienced allergists/ pulmonologists (myself included).   Of the three most common causes of  Sub-acute or recurrent or chronic cough, only one (GERD)  can actually contribute to/ trigger  the other two (asthma and post nasal drip syndrome)  and perpetuate the cylce of cough.  While not intuitively obvious, many patients with chronic low grade reflux (which he clearly has by previous egd) do not cough until there is a primary insult that disturbs the protective epithelial barrier and exposes sensitive nerve endings.   This is typically viral but can be direct physical injury such as with an endotracheal tube.   The point is that once this occurs, it is difficult to eliminate the cycle  using anything but a maximally effective acid suppression regimen at least in the short run, accompanied by an appropriate diet to address non acid GERD and control / eliminate the cough itself for at least 3 days.    rec cyclical cough rx/ f/u prn   I had an extended discussion with the patient  reviewing all relevant studies completed to date and  lasting 15 to 20 minutes of a 25 minute visit    Each maintenance medication was reviewed in detail including most importantly the difference between maintenance and prns and under what circumstances the prns are to be triggered using an action plan format that is not reflected in the computer generated alphabetically organized AVS.    Please see AVS for specific instructions unique to this visit that I personally wrote and verbalized to the the pt in detail and then reviewed with pt  by my nurse highlighting any  changes in therapy recommended at today's visit to their plan of care.

## 2016-12-14 NOTE — Assessment & Plan Note (Signed)
Reviewed approp rx for chronic rhinitis with topical steroids emphasizing how to help them get to the target tissue with limited afrin use  rx also pred x 6 and zpak but nothing stronger for now, return for allergy eval if persists   see avs for instructions unique to this ov

## 2017-03-26 ENCOUNTER — Encounter: Payer: Self-pay | Admitting: Family Medicine

## 2017-03-26 ENCOUNTER — Ambulatory Visit (INDEPENDENT_AMBULATORY_CARE_PROVIDER_SITE_OTHER): Payer: Medicare Other | Admitting: Family Medicine

## 2017-03-26 VITALS — BP 138/72 | HR 63 | Temp 98.5°F | Ht 72.0 in | Wt 194.2 lb

## 2017-03-26 DIAGNOSIS — E785 Hyperlipidemia, unspecified: Secondary | ICD-10-CM | POA: Diagnosis not present

## 2017-03-26 DIAGNOSIS — K219 Gastro-esophageal reflux disease without esophagitis: Secondary | ICD-10-CM | POA: Diagnosis not present

## 2017-03-26 DIAGNOSIS — J301 Allergic rhinitis due to pollen: Secondary | ICD-10-CM

## 2017-03-26 DIAGNOSIS — Z87891 Personal history of nicotine dependence: Secondary | ICD-10-CM

## 2017-03-26 DIAGNOSIS — G4700 Insomnia, unspecified: Secondary | ICD-10-CM

## 2017-03-26 DIAGNOSIS — J309 Allergic rhinitis, unspecified: Secondary | ICD-10-CM | POA: Insufficient documentation

## 2017-03-26 NOTE — Assessment & Plan Note (Signed)
Quit over 40 years ago. AAA planned this year

## 2017-03-26 NOTE — [facility] Notes (Signed)
Phone: [phone]  Subjective:  Patient presents today to establish care.  Prior patient of Dr. Melvyn Novas for primary care only. Chief complaint-noted.   See problem oriented charting  The following were reviewed and entered/updated in epic: Past Medical History:  Diagnosis Date  . Chronic anxiety   . Chronic rhinitis   . Claustrophobia   . Diverticulosis of colon (without mention of hemorrhage)   . DJD (degenerative joint disease)    OA AND PAIN RIGHT KNEE. DDD including fusions lumbar spine  . GERD (gastroesophageal reflux disease)    ONLY WITH CERTAIN FOODS  . History of total right knee replacement   . Hx of vertigo   . Hyperlipidemia   . 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 TOS (thoracic outlet syndrome)    Saw a physical therapist--PAIN RIGHT SHOULDER- MUCH IMPROVED   Patient Active Problem List   Diagnosis Date Noted  . Insomnia 07/18/2015    Priority: Medium  . Hyperlipidemia LDL goal <130 09/16/2008    Priority: Medium  . Chronic rhinitis 09/16/2008    Priority: Medium  . Former smoker 03/26/2017    Priority: Low  . GERD (gastroesophageal reflux disease) 03/26/2017    Priority: Low  . Allergic rhinitis 03/26/2017    Priority: Low  . Cervical spondylosis with radiculopathy 11/30/2015    Priority: Low  . TOS (thoracic outlet syndrome)     Priority: Low  . Cough 10/27/2008    Priority: Low  . Osteoarthritis of right knee 09/16/2008    Priority: Low  . VERTIGO 09/16/2008    Priority: Low   Past Surgical History:  Procedure Laterality Date  . ANTERIOR CERVICAL DECOMP/DISCECTOMY FUSION N/A 11/30/2015   Procedure: CERVICAL SEVEN-THORACIC ONE ANTERIOR CERVICAL DISCECTOMY/FUSION;  Surgeon: Earnie Larsson, MD;  Location: [facility];  Service: Neurosurgery;  Laterality: N/A;  . CERVICAL DISC SURGERY  2009   c3-4 5-6/ by Dr Trenton Gammon- FUSION  . COLONOSCOPY    . TONSILLECTOMY     AS A CHILD  . TOTAL KNEE ARTHROPLASTY Right 07/21/2014   Procedure:  TOTAL RIGHT KNEE ARTHROPLASTY;  Surgeon: Mauri Pole, MD;  Location: [facility];  Service: Orthopedics;  Laterality: Right;    Family History  Problem Relation Age of Onset  . Other Mother        82- "old age". long health in grandparents as well  . Other Father        50- "old age"  . Colon polyps Father        adenomas  . Healthy Sister   . Colon cancer Paternal Aunt     Medications- reviewed and updated Current Outpatient Prescriptions  Medication Sig Dispense Refill  . aspirin 81 MG tablet Take 81 mg by mouth daily.    Marland Kitchen loratadine (CLARITIN) 10 MG tablet Take 10 mg by mouth daily.    . Multiple Vitamins-Minerals (MULTIVITAMIN PO) Take 1 tablet by mouth daily.     No current facility-administered medications for this visit.     Allergies-reviewed and updated No Known Allergies  Social History   Social History  . Marital status: Married    Spouse name: N/A  . Number of children: N/A  . Years of education: N/A   Occupational History  . Retired     Tree surgeon   Social History Main Topics  . Smoking status: Former Smoker    Packs/day: 0.50    Years: 5.00    Types: Cigarettes    Quit date:  09/25/1973  . Smokeless tobacco: Never Used  . Alcohol use 3.5 oz/week    7 Standard drinks or equivalent per week     Comment:  2 drinks per night weekend, vodka & grapefruit juice   . Drug use: No  . Sexual activity: Not Asked   Other Topics Concern  . None   Social History Narrative   Married 1982. 1 daughter. 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    Went to [employer] in [employer].    Goes to [employer].       Hobbies: golf, work out at [employer] at [place], place up at [employer].     ROS--Full ROS was completed ROS Full ROS completed and negative including No chest pain or shortness of breath. No headache or blurry vision. Sparing reflux issues compared with  zantac  Objective: BP 138/72 (BP Location: Left Arm, Patient Position: Sitting, Cuff Size: Large)   Pulse 63   Temp 98.5 F (36.9 C) (Oral)   Ht 6' (1.829 m)   Wt 194 lb 3.2 oz (88.1 kg)   SpO2 97%   BMI 26.34 kg/m  Gen: NAD, resting comfortably HEENT: Mucous membranes are moist. Oropharynx normal. TM normal. Eyes: sclera and lids normal, PERRLA Neck: no thyromegaly, no cervical lymphadenopathy CV: RRR no murmurs rubs or gallops Lungs: CTAB no crackles, wheeze, rhonchi Abdomen: soft/nontender/nondistended/normal bowel sounds. No rebound or guarding.  Ext: no edema Skin: warm, dry Neuro: 5/5 strength in upper and lower extremities, normal gait, normal reflexes  Assessment/Plan:  Hyperlipidemia LDL goal <130 S: poorly controlled on no statin on last check. No myalgias.  Exercises 3-4 x a week at [facility]- gets in an hour and a half Lab Results  Component Value Date   CHOL 201 (H) 07/16/2015   HDL 45.10 07/16/2015   LDLCALC 143 (H) 07/16/2015   LDLDIRECT 143.3 01/24/2012   TRIG 64.0 07/16/2015   CHOLHDL 4 07/16/2015   A/P: ASCVD risk last check 22.7%. We discussed 5-10 lbs weight loss and recheck at [facility] in 6 months.    Former smoker Quit over 40 years ago. AAA planned this year  Insomnia S: poor sleep 5-6 hours a night. A/P: see AVS instructions   GERD (gastroesophageal reflux disease) S: sparing issues A/P: uses prn zantac- continue spairn guse   Allergic rhinitis S: recurrent sinusitis issues. Tries to control baseline issues with claritin or allegra. Now using xyzal A/P: continue current meds- reasonably well controlled   Has been seeing [facility] for primary care- will consider him an established patient as a result  Return in about 6 months (around 09/26/2017) for physical.  come fasting verbal   Return precautions advised. Garret Reddish, MD

## 2017-03-26 NOTE — Assessment & Plan Note (Signed)
S: poor sleep 5-6 hours a night. A/P: see AVS instructions

## 2017-03-26 NOTE — Assessment & Plan Note (Signed)
S: recurrent sinusitis issues. Tries to control baseline issues with claritin or allegra. Now using xyzal A/P: continue current meds- reasonably well controlled

## 2017-03-26 NOTE — Patient Instructions (Addendum)
Ask the [facility] about Shingrix (new shingles vaccine)  We will call you within a week or two about your referral for aneurysm screening. If you do not hear within 3 weeks, give [place] a call.   Consider no screen time an hour before bed No alcohol 2 hours before bed Try to avoid watching tv in bed  Let's check in 6 months from now for a physical. Try to trim perhaps 5-10 lbs off to get back to your more ideal weight of 185 that you were just a few years ago

## 2017-03-26 NOTE — Assessment & Plan Note (Signed)
S: sparing issues A/P: uses prn zantac- continue spairn guse

## 2017-03-26 NOTE — Assessment & Plan Note (Addendum)
S: poorly controlled on no statin on last check. No myalgias.  Exercises 3-4 x a week at [facility]- gets in an hour and a half Lab Results  Component Value Date   CHOL 201 (H) 07/16/2015   HDL 45.10 07/16/2015   LDLCALC 143 (H) 07/16/2015   LDLDIRECT 143.3 01/24/2012   TRIG 64.0 07/16/2015   CHOLHDL 4 07/16/2015   A/P: ASCVD risk last check 22.7%. We discussed 5-10 lbs weight loss and recheck at [facility] in 6 months.

## 2017-04-26 ENCOUNTER — Ambulatory Visit (HOSPITAL_COMMUNITY)
Admission: RE | Admit: 2017-04-26 | Discharge: 2017-04-26 | Disposition: A | Discharge status: Home / Self Care | Payer: Medicare Other | Source: Ambulatory Visit | Attending: Cardiology | Admitting: Cardiology

## 2017-04-26 DIAGNOSIS — Z87891 Personal history of nicotine dependence: Secondary | ICD-10-CM | POA: Diagnosis not present

## 2017-04-26 DIAGNOSIS — Z136 Encounter for screening for cardiovascular disorders: Secondary | ICD-10-CM | POA: Diagnosis not present

## 2017-05-01 ENCOUNTER — Telehealth: Payer: Self-pay | Admitting: Family Medicine

## 2017-05-01 NOTE — Telephone Encounter (Signed)
Returned patient's call and reviewed over [place] results

## 2017-05-01 NOTE — Telephone Encounter (Signed)
Pt is returning jamie call °

## 2017-07-19 ENCOUNTER — Ambulatory Visit (INDEPENDENT_AMBULATORY_CARE_PROVIDER_SITE_OTHER): Payer: Medicare Other

## 2017-07-19 DIAGNOSIS — Z23 Encounter for immunization: Secondary | ICD-10-CM | POA: Diagnosis not present

## 2017-08-21 ENCOUNTER — Encounter: Payer: Self-pay | Admitting: Medical

## 2017-08-21 ENCOUNTER — Ambulatory Visit (INDEPENDENT_AMBULATORY_CARE_PROVIDER_SITE_OTHER): Payer: Medicare Other | Admitting: Medical

## 2017-08-21 VITALS — BP 122/69 | HR 84 | Temp 98.3°F | Resp 16 | Ht 72.0 in | Wt 193.2 lb

## 2017-08-21 DIAGNOSIS — J0100 Acute maxillary sinusitis, unspecified: Secondary | ICD-10-CM | POA: Diagnosis not present

## 2017-08-21 DIAGNOSIS — R059 Cough, unspecified: Secondary | ICD-10-CM

## 2017-08-21 DIAGNOSIS — R05 Cough: Secondary | ICD-10-CM | POA: Diagnosis not present

## 2017-08-21 DIAGNOSIS — R062 Wheezing: Secondary | ICD-10-CM

## 2017-08-21 MED ORDER — DOXYCYCLINE HYCLATE 100 MG PO TABS: 100.0000 mg | ORAL_TABLET | Freq: Two times a day (BID) | ORAL | 0 refills | Status: DC

## 2017-08-21 MED ORDER — ALBUTEROL SULFATE HFA 108 (90 BASE) MCG/ACT IN AERS: 2.0000 | INHALATION_SPRAY | Freq: Four times a day (QID) | RESPIRATORY_TRACT | 2 refills | Status: DC | PRN

## 2017-08-21 MED ORDER — FLUTICASONE PROPIONATE 50 MCG/ACT NA SUSP: 2.0000 | Freq: Every day | NASAL | 1 refills | Status: DC

## 2017-08-21 MED ORDER — HYDROCODONE-HOMATROPINE 5-1.5 MG/5ML PO SYRP: 5.0000 mL | ORAL_SOLUTION | Freq: Three times a day (TID) | ORAL | 0 refills | Status: DC | PRN

## 2017-08-21 NOTE — [facility] Notes (Signed)
Subjective:    Patient ID: Joshua [name], male    DOB: [birth year], 71 y.o.   MRN: [id number]  HPI Pt in for recent nasal congestion over thanksgiving. Pt states sinus pressure and some pnd. He has developed dry cough. No chest congestion. Severe runny nose. Pt states cough is keeping him up.  No fever, no chills, no sweats or body aches.  Pt has faint wheeze and sensation that he can't take full deep breath.    Review of Systems  Constitutional: Negative for chills, fatigue and fever.  HENT: Positive for congestion, sinus pressure and sinus pain. Negative for ear pain, hearing loss, postnasal drip, sneezing and sore throat.   Respiratory: Positive for cough and wheezing. Negative for chest tightness and shortness of breath.        Faint wheeze intermittent and transient wheeze.  Cardiovascular: Negative for chest pain and palpitations.  Gastrointestinal: Negative for abdominal distention, abdominal pain, constipation, nausea and vomiting.  Genitourinary: Negative for dysuria.  Musculoskeletal: Negative for back pain and myalgias.  Skin: Negative for rash.  Neurological: Negative for dizziness, weakness, light-headedness and headaches.  Hematological: Negative for adenopathy. Does not bruise/bleed easily.  Psychiatric/Behavioral: Negative for behavioral problems and confusion.   Past Medical History:  Diagnosis Date  . Chronic anxiety   . Chronic rhinitis   . Claustrophobia   . Diverticulosis of colon (without mention of hemorrhage)   . DJD (degenerative joint disease)    OA AND PAIN RIGHT KNEE. DDD including fusions lumbar spine  . GERD (gastroesophageal reflux disease)    ONLY WITH CERTAIN FOODS  . History of total right knee replacement   . Hx of vertigo   . Hyperlipidemia   . 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 TOS (thoracic outlet syndrome)    Saw a physical therapist--PAIN RIGHT SHOULDER- MUCH IMPROVED     Social History   Socioeconomic  History  . Marital status: Married    Spouse name: Not on file  . Number of children: Not on file  . Years of education: Not on file  . Highest education level: Not on file  Social Needs  . Financial resource strain: Not on file  . Food insecurity - worry: Not on file  . Food insecurity - inability: Not on file  . Transportation needs - medical: Not on file  . Transportation needs - non-medical: Not on file  Occupational History  . Occupation: Retired    Comment: Tree surgeon  Tobacco Use  . Smoking status: Former Smoker    Packs/day: 0.50    Years: 5.00    Pack years: 2.50    Types: Cigarettes    Last attempt to quit: 09/25/1973    Years since quitting: 43.9  . Smokeless tobacco: Never Used  Substance and Sexual Activity  . Alcohol use: Yes    Alcohol/week: 3.5 oz    Types: 7 Standard drinks or equivalent per week    Comment:  2 drinks per night weekend, vodka & grapefruit juice   . Drug use: No  . Sexual activity: Not on file  Other Topics Concern  . Not on file  Social History Narrative   Married 1982. 1 daughter. 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    Went to [employer] in [employer].    Goes to [employer].  Hobbies: golf, work out at [employer] at [place], place up at [employer].     Past Surgical History:  Procedure Laterality Date  . ANTERIOR CERVICAL DECOMP/DISCECTOMY FUSION N/A 11/30/2015   Procedure: CERVICAL SEVEN-THORACIC ONE ANTERIOR CERVICAL DISCECTOMY/FUSION;  Surgeon: Earnie Larsson, MD;  Location: [facility];  Service: Neurosurgery;  Laterality: N/A;  . CERVICAL DISC SURGERY  2009   c3-4 5-6/ by Dr Trenton Gammon- FUSION  . COLONOSCOPY    . TONSILLECTOMY     AS A CHILD  . TOTAL KNEE ARTHROPLASTY Right 07/21/2014   Procedure: TOTAL RIGHT KNEE ARTHROPLASTY;  Surgeon: Mauri Pole, MD;  Location: [facility];  Service: Orthopedics;  Laterality: Right;     Family History  Problem Relation Age of Onset  . Other Mother        65- "old age". long health in grandparents as well  . Other Father        63- "old age"  . Colon polyps Father        adenomas  . Healthy Sister   . Colon cancer Paternal Aunt     No Known Allergies  Current Outpatient Medications on File Prior to Visit  Medication Sig Dispense Refill  . aspirin 81 MG tablet Take 81 mg by mouth daily.    Marland Kitchen loratadine (CLARITIN) 10 MG tablet Take 10 mg by mouth daily.    . Multiple Vitamins-Minerals (MULTIVITAMIN PO) Take 1 tablet by mouth daily.     No current facility-administered medications on file prior to visit.     BP 122/69 (BP Location: Left Arm, Patient Position: Sitting, Cuff Size: Small)   Pulse 84   Temp 98.3 F (36.8 C) (Oral)   Resp 16   Ht 6' (1.829 m)   Wt 193 lb 3.2 oz (87.6 kg)   SpO2 100%   BMI 26.20 kg/m       Objective:   Physical Exam  General  Mental Status - Alert. General Appearance - Well groomed. Not in acute distress.  Skin Rashes- No Rashes.  HEENT Head- Normal. Ear Auditory Canal - Left- Normal. Right - Normal.Tympanic Membrane- Left- Normal. Right- Normal. Eye Sclera/Conjunctiva- Left- Normal. Right- Normal. Nose & Sinuses Nasal Mucosa- Left-  Boggy and Congested. Right-  Boggy and  Congested.Bilateral  maxillary and frontal sinus pressure. Mouth & Throat Lips: Upper Lip- Normal: no dryness, cracking, pallor, cyanosis, or vesicular eruption. Lower Lip-Normal: no dryness, cracking, pallor, cyanosis or vesicular eruption. Buccal Mucosa- Bilateral- No Aphthous ulcers. Oropharynx- No Discharge or Erythema. Tonsils: Characteristics- Bilateral- No Erythema or Congestion. Size/Enlargement- Bilateral- No enlargement. Discharge- bilateral-None.  Neck Neck- Supple. No Masses.   Chest and Lung Exam Auscultation: Breath Sounds:- even and unlabored.(faint low level scattered wheeze)  Cardiovascular Auscultation:Rythm-  Regular, rate and rhythm. Murmurs & Other Heart Sounds:Ausculatation of the heart reveal- No Murmurs.  Lymphatic Head & Neck General Head & Neck Lymphatics: Bilateral: Description- No Localized lymphadenopathy.       Assessment & Plan:  You appear to have a sinus infection with possible bronchitis. I am prescribing antibiotic doxycycline for the infection. To help with the nasal congestion I prescribed flonase nasal steroid. For your associated cough, I prescribed cough medicine hycodan.  For any wheezing albuterol inhaler as instructed.  If any worse wheezing or severe chest congestion then get chest xray.  Rest, hydrate, tylenol for fever.  Follow up in 7 days or as needed.  Armilda Vanderlinden, Percell Miller, PA-C

## 2017-08-21 NOTE — Patient Instructions (Addendum)
You appear to have a sinus infection with possible bronchitis. I am prescribing antibiotic doxycycline for the infection. To help with the nasal congestion I prescribed flonase nasal steroid. For your associated cough, I prescribed cough medicine hycodan.  For any wheezing albuterol inhaler as instructed.  If any worse wheezing or severe chest congestion then get chest xray.  Rest, hydrate, tylenol for fever.  Follow up in 7 days or as needed.

## 2017-10-02 ENCOUNTER — Telehealth: Payer: Self-pay

## 2017-10-02 NOTE — Telephone Encounter (Signed)
Call to Joshua [name]. He is to see Dr. Yong Channel on Thursday at 8:30 and asked if you could stay a few minutes longer to complete the AWV.  Left my confidential number for call back Allen

## 2017-10-03 NOTE — [facility] Notes (Signed)
Subjective:   Joshua [name] is a 72 y.o. male who presents for Medicare Annual/Subsequent preventive examination.  Reports health as good; feel good and does not feel his age [place] vet  - goes to [facility] in [place] in 2015 Cervical discectomy 2017   Diet Chol/hdl 4; trig 64; hdl 45 from 2016; rechecking his labs today Breakfast raisin bran banana and bb Lunch; sandwich;  Supper salads; eats vegetables Wife is trying to fix healthier meals  BMI 25.9   [facility]; did [facility] to [facility] but can't do that anymore Weights and elliptical; walk 4 to 5 miles 120 minutes per day (600 minutes per week)    ETOH 7 per week - one drink a day  Discussed may cause insomnia but has this one drink with supper  Tobacco; quit in 74; 2.5 pack years AAA screen 04/2017 - no aneurysm    There are no preventive care reminders to display for this patient.   Cardiac Risk Factors include: advanced age (>24men, >73 women);male genderColonoscopy 03/2013; Dr. [name] stated he would not need more Took metric out of epic PSA 2012 - Dr. Yong Channel discussed     Objective:    Vitals: BP 136/88   Pulse (!) 57   Ht 6' 0.5" (1.842 m)   Wt 194 lb (88 kg)   BMI 25.95 kg/m   Body mass index is 25.95 kg/m.  Advanced Directives 10/04/2017 11/29/2015 07/21/2014 07/15/2014  Does Patient Have a Medical Advance Directive? Yes Yes Yes Yes  Type of Advance Directive - Living will Healthcare Power of [place] in Chart? - Yes - Yes    Tobacco Social History   Tobacco Use  Smoking Status Former Smoker  . Packs/day: 0.50  . Years: 5.00  . Pack years: 2.50  . Types: Cigarettes  . Last attempt to quit: 09/25/1973  . Years since quitting: 44.0  Smokeless Tobacco Never Used     Counseling given: Yes   Clinical Intake:     Past Medical History:  Diagnosis Date  . Chronic anxiety   . Chronic rhinitis   . Claustrophobia     . Diverticulosis of colon (without mention of hemorrhage)   . DJD (degenerative joint disease)    OA AND PAIN RIGHT KNEE. DDD including fusions lumbar spine  . GERD (gastroesophageal reflux disease)    ONLY WITH CERTAIN FOODS  . History of total right knee replacement   . Hx of vertigo   . Hyperlipidemia   . 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 TOS (thoracic outlet syndrome)    Saw a physical therapist--PAIN RIGHT SHOULDER- MUCH IMPROVED   Past Surgical History:  Procedure Laterality Date  . ANTERIOR CERVICAL DECOMP/DISCECTOMY FUSION N/A 11/30/2015   Procedure: CERVICAL SEVEN-THORACIC ONE ANTERIOR CERVICAL DISCECTOMY/FUSION;  Surgeon: Earnie Larsson, MD;  Location: [facility];  Service: Neurosurgery;  Laterality: N/A;  . CERVICAL DISC SURGERY  2009   c3-4 5-6/ by Dr Trenton Gammon- FUSION  . COLONOSCOPY    . TONSILLECTOMY     AS A CHILD  . TOTAL KNEE ARTHROPLASTY Right 07/21/2014   Procedure: TOTAL RIGHT KNEE ARTHROPLASTY;  Surgeon: Mauri Pole, MD;  Location: [facility];  Service: Orthopedics;  Laterality: Right;   Family History  Problem Relation Age of Onset  . Other Mother        66- "old age". long health in grandparents as  well  . Other Father        70- "old age"  . Colon polyps Father        adenomas  . Healthy Sister   . Colon cancer Paternal Aunt    Social History   Socioeconomic History  . Marital status: Married    Spouse name: Not on file  . Number of children: Not on file  . Years of education: Not on file  . Highest education level: Not on file  Social Needs  . Financial resource strain: Not on file  . Food insecurity - worry: Not on file  . Food insecurity - inability: Not on file  . Transportation needs - medical: Not on file  . Transportation needs - non-medical: Not on file  Occupational History  . Occupation: Retired    Comment: Tree surgeon  Tobacco Use  . Smoking status: Former Smoker    Packs/day: 0.50    Years: 5.00    Pack  years: 2.50    Types: Cigarettes    Last attempt to quit: 09/25/1973    Years since quitting: 44.0  . Smokeless tobacco: Never Used  Substance and Sexual Activity  . Alcohol use: Yes    Alcohol/week: 3.5 oz    Types: 7 Standard drinks or equivalent per week    Comment:  2 drinks per night weekend, vodka & grapefruit juice   . Drug use: No  . Sexual activity: Not on file  Other Topics Concern  . Not on file  Social History Narrative   Married 1982. 1 daughter. 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    Went to [employer] in [employer] [place].    Goes to [employer].       Hobbies: golf, work out at [employer] at [place], place up at [employer].     Outpatient Encounter Medications as of 10/04/2017  Medication Sig  . albuterol (PROVENTIL HFA;VENTOLIN HFA) 108 (90 Base) MCG/ACT inhaler Inhale 2 puffs into the lungs every 6 (six) hours as needed for wheezing or shortness of breath.  Marland Kitchen aspirin 81 MG tablet Take 81 mg by mouth daily.  . fluticasone (FLONASE) 50 MCG/ACT nasal spray Place 2 sprays into both nostrils daily.  Marland Kitchen loratadine (CLARITIN) 10 MG tablet Take 10 mg by mouth daily.  . Multiple Vitamins-Minerals (MULTIVITAMIN PO) Take 1 tablet by mouth daily.  . [DISCONTINUED] doxycycline (VIBRA-TABS) 100 MG tablet Take 1 tablet (100 mg total) by mouth 2 (two) times daily. Can give caps or generic  . [DISCONTINUED] HYDROcodone-homatropine (HYCODAN) 5-1.5 MG/5ML syrup Take 5 mLs by mouth every 8 (eight) hours as needed for cough.   No facility-administered encounter medications on file as of 10/04/2017.     Activities of Daily Living In your present state of health, do you have any difficulty performing the following activities: 10/04/2017  Hearing? N  Vision? N  Difficulty concentrating or making decisions? N  Walking or climbing stairs? N  Dressing or bathing? N  Doing errands, shopping? N    Preparing Food and eating ? N  Using the Toilet? N  In the past six months, have you accidently leaked urine? Y  Comment not as bad on saw palmetto  Do you have problems with loss of bowel control? N  Managing your Medications? N  Managing your Finances? N  Housekeeping or managing your Housekeeping? N  Some  recent data might be hidden    Patient Care Team: Marin Olp, MD as PCP - General (Family Medicine)   Assessment:   This is a routine wellness examination for [facility].  Exercise Activities and Dietary recommendations Current Exercise Habits: Structured exercise class, Type of exercise: strength training/weights;walking, Time (Minutes): 60, Frequency (Times/Week): 5, Weekly Exercise (Minutes/Week): 300, Intensity: Moderate  Goals    . DIET - EAT MORE FRUITS AND VEGETABLES     Have less red meat  Check out  online nutrition programs as GumSearch.nl and http://vang.com/; fit102me; Look for foods with "whole" wheat; bran; oatmeal etc Shot at the farmer's markets in season for fresher choices  Watch for "hydrogenated" on the label of oils which are trans-fats.  Watch for "high fructose corn syrup" in snacks, yogurt or ketchup  Meats have less marbling; bright colored fruits and vegetables;  Canned; dump out liquid and wash vegetables. Be mindful of what we are eating  Portion control is essential to a health weight! Sit down; take a break and enjoy your meal; take smaller bites; put the fork down between bites;  It takes 20 minutes to get full; so check in with your fullness cues and stop eating when you start to fill full              Fall Risk Fall Risk  10/04/2017 03/26/2017  Falls in the past year? No No    Depression Screen PHQ 2/9 Scores 10/04/2017 03/26/2017  PHQ - 2 Score 0 0    Cognitive Function Ad8 score reviewed for issues:  Issues making decisions:  Less interest in hobbies / activities:  Repeats questions, stories (family  complaining):  Trouble using ordinary gadgets (microwave, computer, phone):  Forgets the month or year:   Mismanaging finances:   Remembering appts:  Daily problems with thinking and/or memory: Ad8 score is=0   MMSE - Mini Mental State Exam 10/04/2017  Not completed: (No Data)     Ad8 score reviewed for issues:  Issues making decisions:  Less interest in hobbies / activities:  Repeats questions, stories (family complaining):  Trouble using ordinary gadgets (microwave, computer, phone):  Forgets the month or year:   Mismanaging finances:   Remembering appts:  Daily problems with thinking and/or memory: Ad8 score is=0        Immunization History  Administered Date(s) Administered  . Influenza Split 07/26/2011, 07/02/2015  . Influenza Whole 06/25/2012  . Influenza, High Dose Seasonal PF 07/19/2017  . Influenza,inj,Quad PF,6+ Mos 07/08/2013, 07/20/2014  . Pneumococcal Conjugate-13 07/16/2015  . Pneumococcal Polysaccharide-23 06/25/2006, 11/10/2010  . Td 11/23/2004  . Tdap 07/16/2015     Screening Tests Health Maintenance  Topic Date Due  . TETANUS/TDAP  07/15/2025  . INFLUENZA VACCINE  Completed  . Hepatitis C Screening  Completed  . PNA vac Low Risk Adult  Completed       Plan:      PCP Notes   Health Maintenance Had Shingrix at the [facility] - will call with dates of vaccines There are no preventive care reminders to display for this patient.  States he does not need another colonoscopy and this was removed from metric  Abnormal Screens  States he does not sleep well; discussed and educated regarding sleep hygiene and other measures, deep breathing etc   Referrals  none  Patient concerns; States he is fighting insomnia; does not feel like he goes to sleep  Saw palmetto and feels this is helping.  Takes a nap during  the day  Does go to the [place] at [place]   Discussed ETOH and may hinder staying asleep. Has only one drink with meal at  hs.  Taking Saw palmetto for urine freq x 3 to 4 months and feels this is helping  Nurse Concerns; As noted  Next PCP apt Was seen today      I have personally reviewed and noted the following in the patient's chart:   . Medical and social history . Use of alcohol, tobacco or illicit drugs  . Current medications and supplements . Functional ability and status . Nutritional status . Physical activity . Advanced directives . List of other physicians . Hospitalizations, surgeries, and ER visits in previous 12 months . Vitals . Screenings to include cognitive, depression, and falls . Referrals and appointments  In addition, I have reviewed and discussed with patient certain preventive protocols, quality metrics, and best practice recommendations. A written personalized care plan for preventive services as well as general preventive health recommendations were provided to patient.     Wynetta Fines, RN  10/04/2017

## 2017-10-04 ENCOUNTER — Encounter: Payer: Self-pay | Admitting: Family Medicine

## 2017-10-04 ENCOUNTER — Ambulatory Visit (INDEPENDENT_AMBULATORY_CARE_PROVIDER_SITE_OTHER): Payer: Medicare Other | Admitting: *Deleted

## 2017-10-04 ENCOUNTER — Encounter: Payer: Self-pay | Admitting: *Deleted

## 2017-10-04 ENCOUNTER — Other Ambulatory Visit: Payer: Self-pay

## 2017-10-04 ENCOUNTER — Ambulatory Visit (INDEPENDENT_AMBULATORY_CARE_PROVIDER_SITE_OTHER): Payer: Medicare Other | Admitting: Family Medicine

## 2017-10-04 VITALS — BP 136/88 | HR 57 | Temp 97.5°F | Resp 16 | Ht 72.5 in | Wt 194.0 lb

## 2017-10-04 VITALS — BP 136/88 | HR 57 | Ht 72.5 in | Wt 194.0 lb

## 2017-10-04 DIAGNOSIS — E785 Hyperlipidemia, unspecified: Secondary | ICD-10-CM

## 2017-10-04 DIAGNOSIS — Z0000 Encounter for general adult medical examination without abnormal findings: Secondary | ICD-10-CM | POA: Diagnosis not present

## 2017-10-04 DIAGNOSIS — D692 Other nonthrombocytopenic purpura: Secondary | ICD-10-CM | POA: Diagnosis not present

## 2017-10-04 LAB — COMPREHENSIVE METABOLIC PANEL
ALT: 15 U/L (ref 0–53)
AST: 18 U/L (ref 0–37)
Albumin: 4.3 g/dL (ref 3.5–5.2)
Alkaline Phosphatase: 56 U/L (ref 39–117)
BUN: 16 mg/dL (ref 6–23)
CHLORIDE: 104 meq/L (ref 96–112)
CO2: 30 meq/L (ref 19–32)
CREATININE: 1.16 mg/dL (ref 0.40–1.50)
Calcium: 9.1 mg/dL (ref 8.4–10.5)
GFR: 65.74 mL/min (ref >60.00)
Glucose, Bld: 95 mg/dL (ref 70–99)
Potassium: 5 mEq/L (ref 3.5–5.1)
SODIUM: 138 meq/L (ref 135–145)
Total Bilirubin: 1 mg/dL (ref 0.2–1.2)
Total Protein: 7.6 g/dL (ref 6.0–8.3)

## 2017-10-04 LAB — CBC
HCT: 45.1 % (ref 39.0–52.0)
Hemoglobin: 14.9 g/dL (ref 13.0–17.0)
MCHC: 33 g/dL (ref 30.0–36.0)
MCV: 94.4 fl (ref 78.0–100.0)
Platelets: 248 10*3/uL (ref 150.0–400.0)
RBC: 4.78 Mil/uL (ref 4.22–5.81)
RDW: 14.4 % (ref 11.5–15.5)
WBC: 5.1 10*3/uL (ref 4.0–10.5)

## 2017-10-04 LAB — LIPID PANEL
CHOL/HDL RATIO: 5
Cholesterol: 197 mg/dL (ref 0–200)
HDL: 43.5 mg/dL (ref >39.00)
LDL CALC: 139 mg/dL — AB (ref 0–99)
NonHDL: 153.67
TRIGLYCERIDES: 72 mg/dL (ref 0.0–149.0)
VLDL: 14.4 mg/dL (ref 0.0–40.0)

## 2017-10-04 NOTE — Assessment & Plan Note (Signed)
HLD- 22.7% 10 year risk based on last set of labs at last visit. Plan had been 5-10 lbs weight loss and recheck today.  Lab Results  Component Value Date   CHOL 201 (H) 07/16/2015   HDL 45.10 07/16/2015   LDLCALC 143 (H) 07/16/2015   LDLDIRECT 143.3 01/24/2012   TRIG 64.0 07/16/2015   CHOLHDL 4 07/16/2015   Today- we focused on mediterranean diet, continued exercise (he does great with this). He really does not want to take statin so we agreed to reconsider next year at CPE if risk remains above 20%. May continue aspirin 81mg 

## 2017-10-04 NOTE — [facility] Notes (Signed)
I have reviewed and agree with note, evaluation, plan.  See my separate note from today  Zeph Riebel, MD  

## 2017-10-04 NOTE — Patient Instructions (Addendum)
[name] , Thank you for taking time to come for your Medicare Wellness Visit. I appreciate your ongoing commitment to your health goals. Please review the following plan we discussed and let me know if I can assist you in the future.   Will find out when you have taken the shingrix. You can call Manuela Schwartz at my direct line; [phone] Leave me a message   These are the goals we discussed: Goals    . DIET - EAT MORE FRUITS AND VEGETABLES     Have less red meat  Check out  online nutrition programs as GumSearch.nl and http://vang.com/; fit74me; Look for foods with "whole" wheat; bran; oatmeal etc Shot at the farmer's markets in season for fresher choices  Watch for "hydrogenated" on the label of oils which are trans-fats.  Watch for "high fructose corn syrup" in snacks, yogurt or ketchup  Meats have less marbling; bright colored fruits and vegetables;  Canned; dump out liquid and wash vegetables. Be mindful of what we are eating  Portion control is essential to a health weight! Sit down; take a break and enjoy your meal; take smaller bites; put the fork down between bites;  It takes 20 minutes to get full; so check in with your fullness cues and stop eating when you start to fill full              This is a list of the screening recommended for you and due dates:  Health Maintenance  Topic Date Due  . Colon Cancer Screening  04/03/2023  . Tetanus Vaccine  07/15/2025  . Flu Shot  Completed  .  Hepatitis C: One time screening is recommended by Center for Disease Control  (CDC) for  adults born from [birth year] through 1966.   Completed  . Pneumonia vaccines  Completed   Say good night to insomnia (book)  Try some of the various tactics which I hope you find healthy.   Prevention of falls: Remove rugs or any tripping hazards in the home Use Non slip mats in bathtubs and showers Placing grab bars next to the toilet and or shower Placing handrails on both sides of the stair  way Adding extra lighting in the home.   Personal safety issues reviewed:  1. Consider starting a community watch program per [facility] 2.  Changes batteries is smoke detector and/or carbon monoxide detector  3.  If you have firearms; keep them in a safe place 4.  Wear protection when in the sun; Always wear sunscreen or a hat; It is good to have your doctor check your skin annually or review any new areas of concern 5. Driving safety; Keep in the right lane; stay 3 car lengths behind the car in front of you on the highway; look 3 times prior to pulling out; carry your cell phone everywhere you go!     'Insomnia Insomnia is a sleep disorder that makes it difficult to fall asleep or to stay asleep. Insomnia can cause tiredness (fatigue), low energy, difficulty concentrating, mood swings, and poor performance at work or school. There are three different ways to classify insomnia:  Difficulty falling asleep.  Difficulty staying asleep.  Waking up too early in the morning.  Any type of insomnia can be long-term (chronic) or short-term (acute). Both are common. Short-term insomnia usually lasts for three months or less. Chronic insomnia occurs at least three times a week for longer than three months. What are the causes? Insomnia may be caused  by another condition, situation, or substance, such as:  Anxiety.  Certain medicines.  Gastroesophageal reflux disease (GERD) or other gastrointestinal conditions.  Asthma or other breathing conditions.  Restless legs syndrome, sleep apnea, or other sleep disorders.  Chronic pain.  Menopause. This may include hot flashes.  Stroke.  Abuse of alcohol, tobacco, or illegal drugs.  Depression.  Caffeine.  Neurological disorders, such as Alzheimer disease.  An overactive thyroid (hyperthyroidism).  The cause of insomnia may not be known. What increases the risk? Risk factors for insomnia include:  Gender. Women are more  commonly affected than men.  Age. Insomnia is more common as you get older.  Stress. This may involve your professional or personal life.  Income. Insomnia is more common in people with lower income.  Lack of exercise.  Irregular work schedule or night shifts.  Traveling between different time zones.  What are the signs or symptoms? If you have insomnia, trouble falling asleep or trouble staying asleep is the main symptom. This may lead to other symptoms, such as:  Feeling fatigued.  Feeling nervous about going to sleep.  Not feeling rested in the morning.  Having trouble concentrating.  Feeling irritable, anxious, or depressed.  How is this treated? Treatment for insomnia depends on the cause. If your insomnia is caused by an underlying condition, treatment will focus on addressing the condition. Treatment may also include:  Medicines to help you sleep.  Counseling or therapy.  Lifestyle adjustments.  Follow these instructions at home:  Take medicines only as directed by your health care provider.  Keep regular sleeping and waking hours. Avoid naps.  Keep a sleep diary to help you and your health care provider figure out what could be causing your insomnia. Include: ? When you sleep. ? When you wake up during the night. ? How well you sleep. ? How rested you feel the next day. ? Any side effects of medicines you are taking. ? What you eat and drink.  Make your bedroom a comfortable place where it is easy to fall asleep: ? Put up shades or special blackout curtains to block light from outside. ? Use a white noise machine to block noise. ? Keep the temperature cool.  Exercise regularly as directed by your health care provider. Avoid exercising right before bedtime.  Use relaxation techniques to manage stress. Ask your health care provider to suggest some techniques that may work well for you. These may include: ? Breathing exercises. ? Routines to release  muscle tension. ? Visualizing peaceful scenes.  Cut back on alcohol, caffeinated beverages, and cigarettes, especially close to bedtime. These can disrupt your sleep.  Do not overeat or eat spicy foods right before bedtime. This can lead to digestive discomfort that can make it hard for you to sleep.  Limit screen use before bedtime. This includes: ? Watching TV. ? Using your smartphone, tablet, and computer.  Stick to a routine. This can help you fall asleep faster. Try to do a quiet activity, brush your teeth, and go to bed at the same time each night.  Get out of bed if you are still awake after 15 minutes of trying to sleep. Keep the lights down, but try reading or doing a quiet activity. When you feel sleepy, go back to bed.  Make sure that you drive carefully. Avoid driving if you feel very sleepy.  Keep all follow-up appointments as directed by your health care provider. This is important. Contact a health care provider if:  You are tired throughout the day or have trouble in your daily routine due to sleepiness.  You continue to have sleep problems or your sleep problems get worse. Get help right away if:  You have serious thoughts about hurting yourself or someone else. This information is not intended to replace advice given to you by your health care provider. Make sure you discuss any questions you have with your health care provider. Document Released: 09/08/2000 Document Revised: 02/11/2016 Document Reviewed: 06/12/2014 Elsevier Interactive Patient Education  2018 [place] in the Home Falls can cause injuries. They can happen to people of all ages. There are many things you can do to make your home safe and to help prevent falls. What can I do on the outside of my home?  Regularly fix the edges of walkways and driveways and fix any cracks.  Remove anything that might make you trip as you walk through a door, such as a raised step or  threshold.  Trim any bushes or trees on the path to your home.  Use bright outdoor lighting.  Clear any walking paths of anything that might make someone trip, such as rocks or tools.  Regularly check to see if handrails are loose or broken. Make sure that both sides of any steps have handrails.  Any raised decks and porches should have guardrails on the edges.  Have any leaves, snow, or ice cleared regularly.  Use sand or salt on walking paths during winter.  Clean up any spills in your garage right away. This includes oil or grease spills. What can I do in the bathroom?  Use night lights.  Install grab bars by the toilet and in the tub and shower. Do not use towel bars as grab bars.  Use non-skid mats or decals in the tub or shower.  If you need to sit down in the shower, use a plastic, non-slip stool.  Keep the floor dry. Clean up any water that spills on the floor as soon as it happens.  Remove soap buildup in the tub or shower regularly.  Attach bath mats securely with double-sided non-slip rug tape.  Do not have throw rugs and other things on the floor that can make you trip. What can I do in the bedroom?  Use night lights.  Make sure that you have a light by your bed that is easy to reach.  Do not use any sheets or blankets that are too big for your bed. They should not hang down onto the floor.  Have a firm chair that has side arms. You can use this for support while you get dressed.  Do not have throw rugs and other things on the floor that can make you trip. What can I do in the kitchen?  Clean up any spills right away.  Avoid walking on wet floors.  Keep items that you use a lot in easy-to-reach places.  If you need to reach something above you, use a strong step stool that has a grab bar.  Keep electrical cords out of the way.  Do not use floor polish or wax that makes floors slippery. If you must use wax, use non-skid floor wax.  Do not have  throw rugs and other things on the floor that can make you trip. What can I do with my stairs?  Do not leave any items on the stairs.  Make sure that there are handrails on both sides of the stairs  and use them. Fix handrails that are broken or loose. Make sure that handrails are as long as the stairways.  Check any carpeting to make sure that it is firmly attached to the stairs. Fix any carpet that is loose or worn.  Avoid having throw rugs at the top or bottom of the stairs. If you do have throw rugs, attach them to the floor with carpet tape.  Make sure that you have a light switch at the top of the stairs and the bottom of the stairs. If you do not have them, ask someone to add them for you. What else can I do to help prevent falls?  Wear shoes that: ? Do not have high heels. ? Have rubber bottoms. ? Are comfortable and fit you well. ? Are closed at the toe. Do not wear sandals.  If you use a stepladder: ? Make sure that it is fully opened. Do not climb a closed stepladder. ? Make sure that both sides of the stepladder are locked into place. ? Ask someone to hold it for you, if possible.  Clearly mark and make sure that you can see: ? Any grab bars or handrails. ? First and last steps. ? Where the edge of each step is.  Use tools that help you move around (mobility aids) if they are needed. These include: ? Canes. ? Walkers. ? Scooters. ? Crutches.  Turn on the lights when you go into a dark area. Replace any light bulbs as soon as they burn out.  Set up your furniture so you have a clear path. Avoid moving your furniture around.  If any of your floors are uneven, fix them.  If there are any pets around you, be aware of where they are.  Review your medicines with your doctor. Some medicines can make you feel dizzy. This can increase your chance of falling. Ask your doctor what other things that you can do to help prevent falls. This information is not intended to  replace advice given to you by your health care provider. Make sure you discuss any questions you have with your health care provider. Document Released: 07/08/2009 Document Revised: 02/17/2016 Document Reviewed: 10/16/2014 Elsevier Interactive Patient Education  2018 [place] Maintenance, Male A healthy lifestyle and preventive care is important for your health and wellness. Ask your health care provider about what schedule of regular examinations is right for you. What should I know about weight and diet? Eat a Healthy Diet  Eat plenty of vegetables, fruits, whole grains, low-fat dairy products, and lean protein.  Do not eat a lot of foods high in solid fats, added sugars, or salt.  Maintain a Healthy Weight Regular exercise can help you achieve or maintain a healthy weight. You should:  Do at least 150 minutes of exercise each week. The exercise should increase your heart rate and make you sweat (moderate-intensity exercise).  Do strength-training exercises at least twice a week.  Watch Your Levels of Cholesterol and Blood Lipids  Have your blood tested for lipids and cholesterol every 5 years starting at 73 years of age. If you are at high risk for heart disease, you should start having your blood tested when you are 73 years old. You may need to have your cholesterol levels checked more often if: ? Your lipid or cholesterol levels are high. ? You are older than 73 years of age. ? You are at high risk for heart disease.  What should I  know about cancer screening? Many types of cancers can be detected early and may often be prevented. Lung Cancer  You should be screened every year for lung cancer if: ? You are a current smoker who has smoked for at least 30 years. ? You are a former smoker who has quit within the past 15 years.  Talk to your health care provider about your screening options, when you should start screening, and how often you should be  screened.  Colorectal Cancer  Routine colorectal cancer screening usually begins at 73 years of age and should be repeated every 5-10 years until you are 73 years old. You may need to be screened more often if early forms of precancerous polyps or small growths are found. Your health care provider may recommend screening at an earlier age if you have risk factors for colon cancer.  Your health care provider may recommend using home test kits to check for hidden blood in the stool.  A small camera at the end of a tube can be used to examine your colon (sigmoidoscopy or colonoscopy). This checks for the earliest forms of colorectal cancer.  Prostate and Testicular Cancer  Depending on your age and overall health, your health care provider may do certain tests to screen for prostate and testicular cancer.  Talk to your health care provider about any symptoms or concerns you have about testicular or prostate cancer.  Skin Cancer  Check your skin from head to toe regularly.  Tell your health care provider about any new moles or changes in moles, especially if: ? There is a change in a mole's size, shape, or color. ? You have a mole that is larger than a pencil eraser.  Always use sunscreen. Apply sunscreen liberally and repeat throughout the day.  Protect yourself by wearing long sleeves, pants, a wide-brimmed hat, and sunglasses when outside.  What should I know about heart disease, diabetes, and high blood pressure?  If you are 50-75 years of age, have your blood pressure checked every 3-5 years. If you are 4 years of age or older, have your blood pressure checked every year. You should have your blood pressure measured twice-once when you are at a hospital or clinic, and once when you are not at a hospital or clinic. Record the average of the two measurements. To check your blood pressure when you are not at a hospital or clinic, you can use: ? An automated blood pressure machine at a  pharmacy. ? A home blood pressure monitor.  Talk to your health care provider about your target blood pressure.  If you are between 57-45 years old, ask your health care provider if you should take aspirin to prevent heart disease.  Have regular diabetes screenings by checking your fasting blood sugar level. ? If you are at a normal weight and have a low risk for diabetes, have this test once every three years after the age of 32. ? If you are overweight and have a high risk for diabetes, consider being tested at a younger age or more often.  A one-time screening for abdominal aortic aneurysm (AAA) by ultrasound is recommended for men aged 52-75 years who are current or former smokers. What should I know about preventing infection? Hepatitis B If you have a higher risk for hepatitis B, you should be screened for this virus. Talk with your health care provider to find out if you are at risk for hepatitis B infection. Hepatitis C Blood testing  is recommended for:  Everyone born from [birth year] through 1966.  Anyone with known risk factors for hepatitis C.  Sexually Transmitted Diseases (STDs)  You should be screened each year for STDs including gonorrhea and chlamydia if: ? You are sexually active and are younger than 73 years of age. ? You are older than 73 years of age and your health care provider tells you that you are at risk for this type of infection. ? Your sexual activity has changed since you were last screened and you are at an increased risk for chlamydia or gonorrhea. Ask your health care provider if you are at risk.  Talk with your health care provider about whether you are at high risk of being infected with HIV. Your health care provider may recommend a prescription medicine to help prevent HIV infection.  What else can I do?  Schedule regular health, dental, and eye exams.  Stay current with your vaccines (immunizations).  Do not use any tobacco products, such as  cigarettes, chewing tobacco, and e-cigarettes. If you need help quitting, ask your health care provider.  Limit alcohol intake to no more than 2 drinks per day. One drink equals 12 ounces of beer, 5 ounces of wine, or 1 ounces of hard liquor.  Do not use street drugs.  Do not share needles.  Ask your health care provider for help if you need support or information about quitting drugs.  Tell your health care provider if you often feel depressed.  Tell your health care provider if you have ever been abused or do not feel safe at home. This information is not intended to replace advice given to you by your health care provider. Make sure you discuss any questions you have with your health care provider. Document Released: 03/09/2008 Document Revised: 05/10/2016 Document Reviewed: 06/15/2015 Elsevier Interactive Patient Education  [employer].

## 2017-10-04 NOTE — Assessment & Plan Note (Signed)
Noted on exam. Likely related to aspirin.

## 2017-10-04 NOTE — Progress Notes (Addendum)
Phone: [phone]  Subjective:  Patient presents today for their annual physical. Chief complaint-noted.   See problem oriented charting- ROS- full  review of systems was completed and negative except for: congestion, hearing loss, post nasal drip and sinus pressure, voice change, nonproductive cough, constipation, difficulty urinating with urgenc and frequency, seasonal allergies, bruises easily, insomnia  The following were reviewed and entered/updated in epic: Past Medical History:  Diagnosis Date  . Chronic anxiety   . Chronic rhinitis   . Claustrophobia   . Diverticulosis of colon (without mention of hemorrhage)   . DJD (degenerative joint disease)    OA AND PAIN RIGHT KNEE. DDD including fusions lumbar spine  . GERD (gastroesophageal reflux disease)    ONLY WITH CERTAIN FOODS  . History of total right knee replacement   . Hx of vertigo   . Hyperlipidemia   . 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 TOS (thoracic outlet syndrome)    Saw a physical therapist--PAIN RIGHT SHOULDER- MUCH IMPROVED   Patient Active Problem List   Diagnosis Date Noted  . Insomnia 07/18/2015    Priority: Medium  . Hyperlipidemia LDL goal <130 09/16/2008    Priority: Medium  . Chronic rhinitis 09/16/2008    Priority: Medium  . Former smoker 03/26/2017    Priority: Low  . GERD (gastroesophageal reflux disease) 03/26/2017    Priority: Low  . Allergic rhinitis 03/26/2017    Priority: Low  . Cervical spondylosis with radiculopathy 11/30/2015    Priority: Low  . TOS (thoracic outlet syndrome)     Priority: Low  . Cough 10/27/2008    Priority: Low  . Osteoarthritis of right knee 09/16/2008    Priority: Low  . VERTIGO 09/16/2008    Priority: Low   Past Surgical History:  Procedure Laterality Date  . ANTERIOR CERVICAL DECOMP/DISCECTOMY FUSION N/A 11/30/2015   Procedure: CERVICAL SEVEN-THORACIC ONE ANTERIOR CERVICAL DISCECTOMY/FUSION;  Surgeon: Earnie Larsson, MD;  Location:  [facility];  Service: Neurosurgery;  Laterality: N/A;  . CERVICAL DISC SURGERY  2009   c3-4 5-6/ by Dr Trenton Gammon- FUSION  . COLONOSCOPY    . TONSILLECTOMY     AS A CHILD  . TOTAL KNEE ARTHROPLASTY Right 07/21/2014   Procedure: TOTAL RIGHT KNEE ARTHROPLASTY;  Surgeon: Mauri Pole, MD;  Location: [facility];  Service: Orthopedics;  Laterality: Right;    Family History  Problem Relation Age of Onset  . Other Mother        88- "old age". long health in grandparents as well  . Other Father        63- "old age"  . Colon polyps Father        adenomas  . Healthy Sister   . Colon cancer Paternal Aunt     Medications- reviewed and updated Current Outpatient Medications  Medication Sig Dispense Refill  . albuterol (PROVENTIL HFA;VENTOLIN HFA) 108 (90 Base) MCG/ACT inhaler Inhale 2 puffs into the lungs every 6 (six) hours as needed for wheezing or shortness of breath. 1 Inhaler 2  . aspirin 81 MG tablet Take 81 mg by mouth daily.    . fluticasone (FLONASE) 50 MCG/ACT nasal spray Place 2 sprays into both nostrils daily. 16 g 1  . loratadine (CLARITIN) 10 MG tablet Take 10 mg by mouth daily.    . Multiple Vitamins-Minerals (MULTIVITAMIN PO) Take 1 tablet by mouth daily.     No current facility-administered medications for this visit.     Allergies-reviewed and updated  No Known Allergies  Social History   Socioeconomic History  . Marital status: Married    Spouse name: None  . Number of children: None  . Years of education: None  . Highest education level: None  Social Needs  . Financial resource strain: None  . Food insecurity - worry: None  . Food insecurity - inability: None  . Transportation needs - medical: None  . Transportation needs - non-medical: None  Occupational History  . Occupation: Retired    Comment: Tree surgeon  Tobacco Use  . Smoking status: Former Smoker    Packs/day: 0.50    Years: 5.00    Pack years: 2.50    Types: Cigarettes    Last attempt to quit:  09/25/1973    Years since quitting: 44.0  . Smokeless tobacco: Never Used  Substance and Sexual Activity  . Alcohol use: Yes    Alcohol/week: 3.5 oz    Types: 7 Standard drinks or equivalent per week    Comment:  2 drinks per night weekend, vodka & grapefruit juice   . Drug use: No  . Sexual activity: None  Other Topics Concern  . None  Social History Narrative   Married 1982. 1 daughter. 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    Went to [employer] in [employer].    Goes to [employer].       Hobbies: golf, work out at [employer] at [place], place up at [employer].     Objective: BP 136/88 (BP Location: Left Arm, Patient Position: Sitting, Cuff Size: Normal)   Pulse (!) 57   Temp (!) 97.5 F (36.4 C) (Oral)   Resp 16   Ht 6' 0.5" (1.842 m)   Wt 194 lb (88 kg)   SpO2 98%   BMI 25.95 kg/m  Gen: NAD, resting comfortably HEENT: Mucous membranes are moist. Oropharynx normal Neck: no thyromegaly CV: RRR no murmurs rubs or gallops Lungs: CTAB no crackles, wheeze, rhonchi Abdomen: soft/nontender/nondistended/normal bowel sounds. No rebound or guarding.  Ext: no edema Skin: warm, dry, bruising on arms and hands Neuro: grossly normal, moves all extremities, PERRLA  Rectal deferred due to age and reassuring PSAs at [place]  Assessment/Plan:  72 y.o. male presenting for annual physical.  Health Maintenance counseling: 1. Anticipatory guidance: Patient counseled regarding regular dental exams -q6 months, eye exams -yearly at [facility], wearing seatbelts.  2. Risk factor reduction:  Advised patient of need for regular exercise and diet rich and fruits and vegetables to reduce risk of heart attack and stroke. Exercise- [employer] 3-4x a week for 1.5 hours. Diet-reasonable but we did discuss pushing down 5-10 lbs Wt Readings from Last 3 Encounters:  10/04/17 194 lb (88 kg)  08/21/17 193 lb 3.2 oz (87.6  kg)  03/26/17 194 lb 3.2 oz (88.1 kg)  3. Immunizations/screenings/ancillary studies-discussed shingrix either at [facility] or pharmacy - he already got this at the [place] Immunization History  Administered Date(s) Administered  . Influenza Split 07/26/2011, 07/02/2015  . Influenza Whole 06/25/2012  . Influenza, High Dose Seasonal PF 07/19/2017  . Influenza,inj,Quad PF,6+ Mos 07/08/2013, 07/20/2014  . Pneumococcal Conjugate-13 07/16/2015  . Pneumococcal Polysaccharide-23 06/25/2006, 11/10/2010  . Td 11/23/2004  . Tdap 07/16/2015  4. Prostate cancer screening- PSA monitors the PSA and have not been concerned about his trend- we do not have his #s but given past age 48 I  am ok with them monitoring.   Lab Results  Component Value Date   PSA 0.54 11/10/2010   PSA 0.75 08/05/2009   PSA 0.57 10/08/2006   5. Colon cancer screening - 04/02/13 normal and advised no repeat due to age.  6. Skin cancer screening- could see dermatology at [facility] if needed- waist up exam with mainly seborrheic keratosis on back- no obvious cancerous or precanerous lesions. advised regular sunscreen use. Denies worrisome, changing, or new skin lesions.  7. Former smoker- quit over 40 years ago. AAA screen - negative  Status of chronic or acute concerns   Insomnia- lifestyle modifications per last note. [facility]- mind racing at night. They had given alprazolam but he prefers not to use medicine.   GERD- sparing zantac  Allergic rhinitis- back to claritin. Did have sinus infection back in October- treated and seemed to help. Has tendency to sinus infections- today has symptoms of sinus congestion, some headaches. He would prefer to wait it out for now- he can call me and we can discuss.   Has been tested for hearing at the [place]- and was told no aids needed  Senile purpura- notes very easy bruising/bleeding. Likely contributed to by aspirin  Hyperlipidemia LDL goal <130 HLD- 22.7% 10 year risk based on last set of labs  at last visit. Plan had been 5-10 lbs weight loss and recheck today.  Lab Results  Component Value Date   CHOL 201 (H) 07/16/2015   HDL 45.10 07/16/2015   LDLCALC 143 (H) 07/16/2015   LDLDIRECT 143.3 01/24/2012   TRIG 64.0 07/16/2015   CHOLHDL 4 07/16/2015   Today- we focused on mediterranean diet, continued exercise (he does great with this). He really does not want to take statin so we agreed to reconsider next year at [facility] if risk remains above 20%. May continue aspirin 81mg    Senile purpura ([place]) Noted on exam. Likely related to aspirin.   1 year physical verbal  Hyperlipidemia, unspecified hyperlipidemia type - Plan: CBC, Comprehensive metabolic panel, Lipid panel, POCT Urinalysis Dipstick (Automated)  Return precautions advised.  Garret Reddish, MD

## 2017-10-04 NOTE — Patient Instructions (Addendum)
Please stop by lab before you go  Mediterranean diet is very heart healthy- may be worth working on to see if we can get cholesterol down. Great job at [employer] though!   If your 10 year risk of heart attack or stroke is above 20% this year and next still- I will likely recommend at least a low dose of cholesterl medicine such as once a week   Mediterranean Diet A Mediterranean diet refers to food and lifestyle choices that are based on the traditions of countries located on the [employer]. This way of eating has been shown to help prevent certain conditions and improve outcomes for people who have chronic diseases, like kidney disease and heart disease. What are tips for following this plan? Lifestyle  Cook and eat meals together with your family, when possible.  Drink enough fluid to keep your urine clear or pale yellow.  Be physically active every day. This includes: ? Aerobic exercise like running or swimming. ? Leisure activities like gardening, walking, or housework.  Get 7-8 hours of sleep each night.  If recommended by your health care provider, drink red wine in moderation. This means 1 glass a day for nonpregnant women and 2 glasses a day for men. A glass of wine equals 5 oz (150 mL). Reading food labels  Check the serving size of packaged foods. For foods such as rice and pasta, the serving size refers to the amount of cooked product, not dry.  Check the total fat in packaged foods. Avoid foods that have saturated fat or trans fats.  Check the ingredients list for added sugars, such as corn syrup. Shopping  At the grocery store, buy most of your food from the areas near the walls of the store. This includes: ? Fresh fruits and vegetables (produce). ? Grains, beans, nuts, and seeds. Some of these may be available in unpackaged forms or large amounts (in bulk). ? Fresh seafood. ? Poultry and eggs. ? Low-fat dairy products.  Buy whole ingredients instead of  prepackaged foods.  Buy fresh fruits and vegetables in-season from local farmers markets.  Buy frozen fruits and vegetables in resealable bags.  If you do not have access to quality fresh seafood, buy precooked frozen shrimp or canned fish, such as tuna, salmon, or sardines.  Buy small amounts of raw or cooked vegetables, salads, or olives from the deli or salad bar at your store.  Stock your pantry so you always have certain foods on hand, such as olive oil, canned tuna, canned tomatoes, rice, pasta, and beans. Cooking  Cook foods with extra-virgin olive oil instead of using butter or other vegetable oils.  Have meat as a side dish, and have vegetables or grains as your main dish. This means having meat in small portions or adding small amounts of meat to foods like pasta or stew.  Use beans or vegetables instead of meat in common dishes like chili or lasagna.  Experiment with different cooking methods. Try roasting or broiling vegetables instead of steaming or sauteing them.  Add frozen vegetables to soups, stews, pasta, or rice.  Add nuts or seeds for added healthy fat at each meal. You can add these to yogurt, salads, or vegetable dishes.  Marinate fish or vegetables using olive oil, lemon juice, garlic, and fresh herbs. Meal planning  Plan to eat 1 vegetarian meal one day each week. Try to work up to 2 vegetarian meals, if possible.  Eat seafood 2 or more times a week.  Have healthy snacks readily available, such as: ? Vegetable sticks with hummus. ? [place] yogurt. ? Fruit and nut trail mix.  Eat balanced meals throughout the week. This includes: ? Fruit: 2-3 servings a day ? Vegetables: 4-5 servings a day ? Low-fat dairy: 2 servings a day ? Fish, poultry, or lean meat: 1 serving a day ? Beans and legumes: 2 or more servings a week ? Nuts and seeds: 1-2 servings a day ? Whole grains: 6-8 servings a day ? Extra-virgin olive oil: 3-4 servings a day  Limit red meat  and sweets to only a few servings a month What are my food choices?  Mediterranean diet ? Recommended ? Grains: Whole-grain pasta. Brown rice. Bulgar wheat. Polenta. Couscous. Whole-wheat bread. [place]. ? Vegetables: Artichokes. Beets. Broccoli. Cabbage. Carrots. Eggplant. Green beans. Chard. Kale. Spinach. Onions. Leeks. Peas. Squash. Tomatoes. Peppers. Radishes. ? Fruits: Apples. Apricots. Avocado. Berries. Bananas. Cherries. Dates. Figs. Grapes. Lemons. Melon. Oranges. Peaches. Plums. Pomegranate. ? Meats and other protein foods: Beans. Almonds. Sunflower seeds. Pine nuts. Peanuts. [place]. Salmon. Scallops. Shrimp. [place]. Tilapia. Clams. Oysters. Eggs. ? Dairy: Low-fat milk. Cheese. Greek yogurt. ? Beverages: Water. Red wine. Herbal tea. ? Fats and oils: Extra virgin olive oil. Avocado oil. Grape seed oil. ? Sweets and desserts: [place] yogurt with honey. Baked apples. Poached pears. Trail mix. ? Seasoning and other foods: Basil. Cilantro. Coriander. Cumin. Mint. Parsley. Sage. Rosemary. Tarragon. Garlic. Oregano. Thyme. Pepper. Balsalmic vinegar. Tahini. Hummus. Tomato sauce. Olives. Mushrooms. ? Limit these ? Grains: Prepackaged pasta or rice dishes. Prepackaged cereal with added sugar. ? Vegetables: Deep fried potatoes (french fries). ? Fruits: Fruit canned in syrup. ? Meats and other protein foods: Beef. Pork. Lamb. Poultry with skin. Hot dogs. Berniece Salines. ? Dairy: Ice cream. Sour cream. Whole milk. ? Beverages: Juice. Sugar-sweetened soft drinks. Beer. Liquor and spirits. ? Fats and oils: Butter. Canola oil. Vegetable oil. Beef fat (tallow). Lard. ? Sweets and desserts: Cookies. Cakes. Pies. Candy. ? Seasoning and other foods: Mayonnaise. Premade sauces and marinades. ? The items listed may not be a complete list. Talk with your dietitian about what dietary choices are right for you. Summary  The Mediterranean diet includes both food and lifestyle choices.  Eat a variety of fresh  fruits and vegetables, beans, nuts, seeds, and whole grains.  Limit the amount of red meat and sweets that you eat.  Talk with your health care provider about whether it is safe for you to drink red wine in moderation. This means 1 glass a day for nonpregnant women and 2 glasses a day for men. A glass of wine equals 5 oz (150 mL). This information is not intended to replace advice given to you by your health care provider. Make sure you discuss any questions you have with your health care provider. Document Released: 05/04/2016 Document Revised: 06/06/2016 Document Reviewed: 05/04/2016 Elsevier Interactive Patient Education  [employer].

## 2017-11-16 DIAGNOSIS — L720 Epidermal cyst: Secondary | ICD-10-CM | POA: Diagnosis not present

## 2017-12-06 DIAGNOSIS — L720 Epidermal cyst: Secondary | ICD-10-CM | POA: Diagnosis not present

## 2017-12-06 DIAGNOSIS — L929 Granulomatous disorder of the skin and subcutaneous tissue, unspecified: Secondary | ICD-10-CM | POA: Diagnosis not present

## 2018-04-24 DIAGNOSIS — M1711 Unilateral primary osteoarthritis, right knee: Secondary | ICD-10-CM | POA: Diagnosis not present

## 2018-04-24 DIAGNOSIS — M1712 Unilateral primary osteoarthritis, left knee: Secondary | ICD-10-CM | POA: Diagnosis not present

## 2018-10-10 ENCOUNTER — Ambulatory Visit: Payer: Medicare Other

## 2018-10-10 ENCOUNTER — Ambulatory Visit: Payer: Medicare Other | Admitting: Family Medicine

## 2018-10-16 ENCOUNTER — Ambulatory Visit (INDEPENDENT_AMBULATORY_CARE_PROVIDER_SITE_OTHER): Payer: Medicare Other

## 2018-10-16 VITALS — BP 128/84 | HR 67 | Temp 98.0°F | Ht 72.5 in | Wt 194.4 lb

## 2018-10-16 DIAGNOSIS — Z0000 Encounter for general adult medical examination without abnormal findings: Secondary | ICD-10-CM | POA: Diagnosis not present

## 2018-10-16 NOTE — [place] Notes (Signed)
Subjective:   Joshua [name] is a 73 y.o. male who presents for Medicare Annual/Subsequent preventive examination.  Review of Systems:  No ROS.  Medicare Wellness Visit. Additional risk factors are reflected in the social history. Cardiac Risk Factors include: advanced age (>7men, >54 women);male gender  Patient lives in a single story home with wife Jackelyn Poling of 56 years. He has a daughter that lives in [place]. Patient has a house at [employer] and a house at [place] [employer]. Patient enjoys golf, and spending time at the [facility]. Likes hanging out with his guy friends, goes out to lunch and dinner frequently.   Patient goes to bed around 10:00pm. No CPAP. Uses Breath Right strips at night. Gets up around 3-4 times a night but recently started tamsulosin and finds he is getting up less now.Gets up around 6:30am. Does not sleep well a lot. No clear reason. Discussed trying melatonin before bedtime.    Objective:    Vitals: BP 128/84 (BP Location: Left Arm, Patient Position: Sitting, Cuff Size: Large)   Pulse 67   Temp 98 F (36.7 C) (Oral)   Ht 6' 0.5" (1.842 m)   Wt 194 lb 6.4 oz (88.2 kg)   SpO2 98%   BMI 26.00 kg/m   Body mass index is 26 kg/m.  Advanced Directives 10/16/2018 10/04/2017 11/29/2015 07/21/2014 07/15/2014  Does Patient Have a Medical Advance Directive? Yes Yes Yes Yes Yes  Type of Paramedic of [place];Living will - Living will Healthcare Power of [place]  Does patient want to make changes to medical advance directive? No - Patient declined - - - -  Copy of [place] in Chart? Yes - validated most recent copy scanned in chart (See row information) - Yes - Yes    Tobacco Social History   Tobacco Use  Smoking Status Former Smoker  . Packs/day: 0.50  . Years: 5.00  . Pack years: 2.50  . Types: Cigarettes  . Last attempt to quit: 09/25/1973  . Years since quitting: 45.0  Smokeless Tobacco Never  Used     Counseling given: Not Answered  Past Medical History:  Diagnosis Date  . Chronic anxiety   . Chronic rhinitis   . Claustrophobia   . Diverticulosis of colon (without mention of hemorrhage)   . DJD (degenerative joint disease)    OA AND PAIN RIGHT KNEE. DDD including fusions lumbar spine  . GERD (gastroesophageal reflux disease)    ONLY WITH CERTAIN FOODS  . History of total right knee replacement   . Hx of vertigo   . Hyperlipidemia   . 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 TOS (thoracic outlet syndrome)    Saw a physical therapist--PAIN RIGHT SHOULDER- MUCH IMPROVED   Past Surgical History:  Procedure Laterality Date  . ANTERIOR CERVICAL DECOMP/DISCECTOMY FUSION N/A 11/30/2015   Procedure: CERVICAL SEVEN-THORACIC ONE ANTERIOR CERVICAL DISCECTOMY/FUSION;  Surgeon: Earnie Larsson, MD;  Location: [place] [facility];  Service: Neurosurgery;  Laterality: N/A;  . CERVICAL DISC SURGERY  2009   c3-4 5-6/ by Dr Trenton Gammon- FUSION  . COLONOSCOPY    . TONSILLECTOMY     AS A CHILD  . TOTAL KNEE ARTHROPLASTY Right 07/21/2014   Procedure: TOTAL RIGHT KNEE ARTHROPLASTY;  Surgeon: Mauri Pole, MD;  [facility];  Service: Orthopedics;  Laterality: Right;   Family History  Problem Relation Age of Onset  . Other Mother  79- "old age". long health in grandparents as well  . Other Father        28- "old age"  . Colon polyps Father        adenomas  . Healthy Sister   . Colon cancer Paternal Aunt    Social History   Socioeconomic History  . Marital status: Married    Spouse name: Not on file  . Number of children: Not on file  . Years of education: Not on file  . Highest education level: Not on file  Occupational History  . Occupation: Retired    Comment: Tree surgeon  Social Needs  . Financial resource strain: Not on file  . Food insecurity:    Worry: Not on file    Inability: Not on file  . Transportation needs:    Medical: Not on file     Non-medical: Not on file  Tobacco Use  . Smoking status: Former Smoker    Packs/day: 0.50    Years: 5.00    Pack years: 2.50    Types: Cigarettes    Last attempt to quit: 09/25/1973    Years since quitting: 45.0  . Smokeless tobacco: Never Used  Substance and Sexual Activity  . Alcohol use: Yes    Alcohol/week: 7.0 standard drinks    Types: 7 Standard drinks or equivalent per week    Comment:  2 drinks per night weekend, vodka & grapefruit juice   . Drug use: No  . Sexual activity: Yes  Lifestyle  . Physical activity:    Days per week: Not on file    Minutes per session: Not on file  . Stress: Not on file  Relationships  . Social connections:    Talks on phone: Not on file    Gets together: Not on file    Attends religious service: Not on file    Active member of club or organization: Not on file    Attends meetings of clubs or organizations: Not on file    Relationship status: Not on file  Other Topics Concern  . Not on file  Social History Narrative   Married 1982. 1 daughter. 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    Went to [employer] in [employer].    Goes to [employer].       Hobbies: golf, work out at [employer] at [place], place up at [employer].     Outpatient Encounter Medications as of 10/16/2018  Medication Sig  . aspirin 81 MG tablet Take 81 mg by mouth daily.  . cetirizine (ZYRTEC) 10 MG tablet Take 10 mg by mouth at bedtime.  . Saw Palmetto, Serenoa repens, (SAW PALMETTO BERRY PO) Take by mouth.  . tamsulosin (FLOMAX) 0.4 MG CAPS capsule Take 0.4 mg by mouth daily.  Marland Kitchen albuterol (PROVENTIL HFA;VENTOLIN HFA) 108 (90 Base) MCG/ACT inhaler Inhale 2 puffs into the lungs every 6 (six) hours as needed for wheezing or shortness of breath. (Patient not taking: Reported on 10/16/2018)  . fluticasone (FLONASE) 50 MCG/ACT nasal spray Place 2 sprays into both nostrils  daily. (Patient not taking: Reported on 10/16/2018)  . [DISCONTINUED] loratadine (CLARITIN) 10 MG tablet Take 10 mg by mouth daily.  . [DISCONTINUED] Multiple Vitamins-Minerals (MULTIVITAMIN PO) Take 1 tablet by mouth daily.   No facility-administered encounter medications on file as of 10/16/2018.     Activities of  Daily Living In your present state of health, do you have any difficulty performing the following activities: 10/16/2018  Hearing? N  Vision? N  Difficulty concentrating or making decisions? N  Walking or climbing stairs? N  Dressing or bathing? N  Doing errands, shopping? N  Preparing Food and eating ? N  Using the Toilet? N  In the past six months, have you accidently leaked urine? N  Do you have problems with loss of bowel control? N  Managing your Medications? N  Managing your Finances? N  Housekeeping or managing your Housekeeping? N  Some recent data might be hidden    Patient Care Team: Marin Olp, MD as PCP - General (Family Medicine)   Assessment:   This is a routine wellness examination for [facility].  Exercise Activities and Dietary recommendations Current Exercise Habits: Structured exercise class([employer]), Type of exercise: stretching;walking;calisthenics;strength training/weights, Time (Minutes): 60, Frequency (Times/Week): 3, Weekly Exercise (Minutes/Week): 180, Intensity: Moderate, Exercise limited by: None identified   Breakfast: whole grain cereal, bananas and blueberries, coffee with flavored creamers  Lunch: Goes out frequently, left overs, sandwiches, hot dogs, soup, normally drinks green tea  Dinner: Goes out frequently for dinner, tries to avoid fried foods, spaghetti, chicken and dumplings, salads    Goals    . DIET - EAT MORE FRUITS AND VEGETABLES     Have less red meat  Check out  online nutrition programs as GumSearch.nl and http://vang.com/; fit34me; Look for foods with "whole" wheat; bran; oatmeal etc Shot at the farmer's  markets in season for fresher choices  Watch for "hydrogenated" on the label of oils which are trans-fats.  Watch for "high fructose corn syrup" in snacks, yogurt or ketchup  Meats have less marbling; bright colored fruits and vegetables;  Canned; dump out liquid and wash vegetables. Be mindful of what we are eating  Portion control is essential to a health weight! Sit down; take a break and enjoy your meal; take smaller bites; put the fork down between bites;  It takes 20 minutes to get full; so check in with your fullness cues and stop eating when you start to fill full              Fall Risk Fall Risk  10/16/2018 10/04/2017 03/26/2017  Falls in the past year? 0 No No    Depression Screen PHQ 2/9 Scores 10/16/2018 10/04/2017 03/26/2017  PHQ - 2 Score 0 0 0    Cognitive Function MMSE - Mini Mental State Exam 10/16/2018 10/04/2017  Not completed: - (No Data)  Orientation to time 5 -  Orientation to Place 5 -  Registration 3 -  Attention/ Calculation 4 -  Recall 2 -  Language- name 2 objects 2 -  Language- repeat 1 -  Language- follow 3 step command 3 -  Language- read & follow direction 1 -  Write a sentence 1 -  Copy design 1 -  Total score 28 -        Immunization History  Administered Date(s) Administered  . Influenza Split 07/26/2011, 07/02/2015  . Influenza Whole 06/25/2012  . Influenza, High Dose Seasonal PF 07/19/2017  . Influenza,inj,Quad PF,6+ Mos 07/08/2013, 07/20/2014  . Pneumococcal Conjugate-13 07/16/2015  . Pneumococcal Polysaccharide-23 06/25/2006, 11/10/2010  . Td 11/23/2004  . Tdap 07/16/2015  . Zoster Recombinat (Shingrix) 07/02/2017, 09/03/2017      Screening Tests Health Maintenance  Topic Date Due  . INFLUENZA VACCINE  04/25/2018  . TETANUS/TDAP  07/15/2025  . Hepatitis C  Screening  Completed  . PNA vac Low Risk Adult  Completed       Plan:  Follow Up with PCP as Advised  I have personally reviewed and noted the following in the  patient's chart:   . Medical and social history . Use of alcohol, tobacco or illicit drugs  . Current medications and supplements . Functional ability and status . Nutritional status . Physical activity . Advanced directives . List of other physicians . Vitals . Screenings to include cognitive, depression, and falls . Referrals and appointments  In addition, I have reviewed and discussed with patient certain preventive protocols, quality metrics, and best practice recommendations. A written personalized care plan for preventive services as well as general preventive health recommendations were provided to patient.     [place], [place]  [id number]/[id number]

## 2018-10-16 NOTE — Patient Instructions (Addendum)
Joshua [name] , Thank you for taking time to come for your Medicare Wellness Visit. I appreciate your ongoing commitment to your health goals. Please review the following plan we discussed and let me know if I can assist you in the future.   These are the goals we discussed: Goals    . DIET - EAT MORE FRUITS AND VEGETABLES     Have less red meat  Check out  online nutrition programs as GumSearch.nl and http://vang.com/; fit81me; Look for foods with "whole" wheat; bran; oatmeal etc Shot at the farmer's markets in season for fresher choices  Watch for "hydrogenated" on the label of oils which are trans-fats.  Watch for "high fructose corn syrup" in snacks, yogurt or ketchup  Meats have less marbling; bright colored fruits and vegetables;  Canned; dump out liquid and wash vegetables. Be mindful of what we are eating  Portion control is essential to a health weight! Sit down; take a break and enjoy your meal; take smaller bites; put the fork down between bites;  It takes 20 minutes to get full; so check in with your fullness cues and stop eating when you start to fill full              This is a list of the screening recommended for you and due dates:  Health Maintenance  Topic Date Due  . Tetanus Vaccine  07/15/2025  . Flu Shot  Completed  .  Hepatitis C: One time screening is recommended by Center for Disease Control  (CDC) for  adults born from [birth year] through 1966.   Completed  . Pneumonia vaccines  Completed   Preventive Care for Adults  A healthy lifestyle and preventive care can promote health and wellness. Preventive health guidelines for adults include the following key practices.  . A routine yearly physical is a good way to check with your health care provider about your health and preventive screening. It is a chance to share any concerns and updates on your health and to receive a thorough exam.  . Visit your dentist for a routine exam and preventive care every 6  months. Brush your teeth twice a day and floss once a day. Good oral hygiene prevents tooth decay and gum disease.  . The frequency of eye exams is based on your age, health, family medical history, use  of contact lenses, and other factors. Follow your health care provider's recommendations for frequency of eye exams.  . Eat a healthy diet. Foods like vegetables, fruits, whole grains, low-fat dairy products, and lean protein foods contain the nutrients you need without too many calories. Decrease your intake of foods high in solid fats, added sugars, and salt. Eat the right amount of calories for you. Get information about a proper diet from your health care provider, if necessary.  . Regular physical exercise is one of the most important things you can do for your health. Most adults should get at least 150 minutes of moderate-intensity exercise (any activity that increases your heart rate and causes you to sweat) each week. In addition, most adults need muscle-strengthening exercises on 2 or more days a week.  Silver Sneakers may be a benefit available to you. To determine eligibility, you may visit the website: www.silversneakers.com or contact program at 740-347-4414 Mon-Fri between 8AM-8PM.   . Maintain a healthy weight. The body mass index (BMI) is a screening tool to identify possible weight problems. It provides an estimate of body fat based on height and  weight. Your health care provider can find your BMI and can help you achieve or maintain a healthy weight.   For adults 20 years and older: ? A BMI below 18.5 is considered underweight. ? A BMI of 18.5 to 24.9 is normal. ? A BMI of 25 to 29.9 is considered overweight. ? A BMI of 30 and above is considered obese.   . Maintain normal blood lipids and cholesterol levels by exercising and minimizing your intake of saturated fat. Eat a balanced diet with plenty of fruit and vegetables. Blood tests for lipids and cholesterol should begin at  age 57 and be repeated every 5 years. If your lipid or cholesterol levels are high, you are over 50, or you are at high risk for heart disease, you may need your cholesterol levels checked more frequently. Ongoing high lipid and cholesterol levels should be treated with medicines if diet and exercise are not working.  . If you smoke, find out from your health care provider how to quit. If you do not use tobacco, please do not start.  . If you choose to drink alcohol, please do not consume more than 2 drinks per day. One drink is considered to be 12 ounces (355 mL) of beer, 5 ounces (148 mL) of wine, or 1.5 ounces (44 mL) of liquor.  . If you are 21-48 years old, ask your health care provider if you should take aspirin to prevent strokes.  . Use sunscreen. Apply sunscreen liberally and repeatedly throughout the day. You should seek shade when your shadow is shorter than you. Protect yourself by wearing long sleeves, pants, a wide-brimmed hat, and sunglasses year round, whenever you are outdoors.  . Once a month, do a whole body skin exam, using a mirror to look at the skin on your back. Tell your health care provider of new moles, moles that have irregular borders, moles that are larger than a pencil eraser, or moles that have changed in shape or color.

## 2018-10-16 NOTE — [facility] Notes (Signed)
PCP notes: Last OV 10/04/2017   Health maintenance: Flu Vaccine-updated as he received at the [place]   Abnormal screenings: MMSE score of 28   Patient concerns: None   Nurse concerns:None   Next PCP appt: 10/17/2018

## 2018-10-17 ENCOUNTER — Encounter: Payer: Self-pay | Admitting: Family Medicine

## 2018-10-17 ENCOUNTER — Ambulatory Visit (INDEPENDENT_AMBULATORY_CARE_PROVIDER_SITE_OTHER): Payer: Medicare Other | Admitting: Family Medicine

## 2018-10-17 VITALS — BP 120/70 | HR 56 | Temp 98.0°F | Ht 73.0 in | Wt 195.0 lb

## 2018-10-17 DIAGNOSIS — J310 Chronic rhinitis: Secondary | ICD-10-CM | POA: Diagnosis not present

## 2018-10-17 DIAGNOSIS — Z87891 Personal history of nicotine dependence: Secondary | ICD-10-CM

## 2018-10-17 DIAGNOSIS — D692 Other nonthrombocytopenic purpura: Secondary | ICD-10-CM

## 2018-10-17 DIAGNOSIS — E785 Hyperlipidemia, unspecified: Secondary | ICD-10-CM

## 2018-10-17 DIAGNOSIS — Z6825 Body mass index (BMI) 25.0-25.9, adult: Secondary | ICD-10-CM

## 2018-10-17 DIAGNOSIS — Z0000 Encounter for general adult medical examination without abnormal findings: Secondary | ICD-10-CM | POA: Diagnosis not present

## 2018-10-17 DIAGNOSIS — G4700 Insomnia, unspecified: Secondary | ICD-10-CM

## 2018-10-17 NOTE — [facility] Notes (Signed)
Phone: [phone]  Subjective:  Patient presents today for their annual physical. Chief complaint-noted.   See problem oriented charting- ROS- full  review of systems was completed and negative except for: Occasional acid reflux, anxiety at times related to PTSD  The following were reviewed and entered/updated in epic: Past Medical History:  Diagnosis Date  . Chronic anxiety   . Chronic rhinitis   . Claustrophobia   . Diverticulosis of colon (without mention of hemorrhage)   . DJD (degenerative joint disease)    OA AND PAIN RIGHT KNEE. DDD including fusions lumbar spine  . GERD (gastroesophageal reflux disease)    ONLY WITH CERTAIN FOODS  . History of total right knee replacement   . Hx of vertigo   . Hyperlipidemia   . 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 TOS (thoracic outlet syndrome)    Saw a physical therapist--PAIN RIGHT SHOULDER- MUCH IMPROVED   Patient Active Problem List   Diagnosis Date Noted  . Insomnia 07/18/2015    Priority: Medium  . Hyperlipidemia LDL goal <130 09/16/2008    Priority: Medium  . Chronic rhinitis 09/16/2008    Priority: Medium  . Former smoker 03/26/2017    Priority: Low  . GERD (gastroesophageal reflux disease) 03/26/2017    Priority: Low  . Allergic rhinitis 03/26/2017    Priority: Low  . Cervical spondylosis with radiculopathy 11/30/2015    Priority: Low  . TOS (thoracic outlet syndrome)     Priority: Low  . Cough 10/27/2008    Priority: Low  . Osteoarthritis of right knee 09/16/2008    Priority: Low  . VERTIGO 09/16/2008    Priority: Low  . Senile purpura ([place]) 10/04/2017   Past Surgical History:  Procedure Laterality Date  . ANTERIOR CERVICAL DECOMP/DISCECTOMY FUSION N/A 11/30/2015   Procedure: CERVICAL SEVEN-THORACIC ONE ANTERIOR CERVICAL DISCECTOMY/FUSION;  Surgeon: Earnie Larsson, MD;  Location: [place] [facility];  Service: Neurosurgery;  Laterality: N/A;  . CERVICAL DISC SURGERY  2009   c3-4 5-6/ by Dr  Trenton Gammon- FUSION  . COLONOSCOPY    . TONSILLECTOMY     AS A CHILD  . TOTAL KNEE ARTHROPLASTY Right 07/21/2014   Procedure: TOTAL RIGHT KNEE ARTHROPLASTY;  Surgeon: Mauri Pole, MD;  [facility];  Service: Orthopedics;  Laterality: Right;    Family History  Problem Relation Age of Onset  . Other Mother        38- "old age". long health in grandparents as well  . Other Father        61- "old age"  . Colon polyps Father        adenomas  . Healthy Sister   . Colon cancer Paternal Aunt     Medications- reviewed and updated Current Outpatient Medications  Medication Sig Dispense Refill  . albuterol (PROVENTIL HFA;VENTOLIN HFA) 108 (90 Base) MCG/ACT inhaler Inhale 2 puffs into the lungs every 6 (six) hours as needed for wheezing or shortness of breath. 1 Inhaler 2  . aspirin 81 MG tablet Take 81 mg by mouth daily.    . cetirizine (ZYRTEC) 10 MG tablet Take 10 mg by mouth at bedtime.    . fluticasone (FLONASE) 50 MCG/ACT nasal spray Place 2 sprays into both nostrils daily. 16 g 1  . Saw Palmetto, Serenoa repens, (SAW PALMETTO BERRY PO) Take by mouth.    . tamsulosin (FLOMAX) 0.4 MG CAPS capsule Take 0.4 mg by mouth daily.     No current facility-administered medications for this  visit.     Allergies-reviewed and updated No Known Allergies  Social History   Social History Narrative   Married 1982. 1 daughter. 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    Went to [employer] in [employer].    Goes to [employer].       Hobbies: golf, work out at [employer] at [place], place up at [employer].     Objective: BP 120/70 (BP Location: Left Arm, Patient Position: Sitting, Cuff Size: Large)   Pulse (!) 56   Temp 98 F (36.7 C) (Oral)   Ht 6\' 1"  (1.854 m)   Wt 195 lb (88.5 kg)   SpO2 98%   BMI 25.73 kg/m  Gen: NAD, resting comfortably HEENT: Mucous membranes are moist. Oropharynx  normal Neck: no thyromegaly CV: RRR no murmurs rubs or gallops Lungs: CTAB no crackles, wheeze, rhonchi Abdomen: soft/nontender/nondistended/normal bowel sounds. No rebound or guarding.  Ext: no edema Skin: warm, dry Neuro: grossly normal, moves all extremities, PERRLA  Assessment/Plan:  73 y.o. male presenting for annual physical.  Health Maintenance counseling: 1. Anticipatory guidance: Patient counseled regarding regular dental exams -q6 months, eye exams- at [facility] yearly,  avoiding smoking and second hand smoke , limiting alcohol to 2 beverages per day - up to 2 a night but not everynight 2. Risk factor reduction:  Advised patient of need for regular exercise and diet rich and fruits and vegetables to reduce risk of heart attack and stroke. Exercise-last year was doing 3 to 4 days at the [facility] for 1-1/2 hours- this year . Diet- Weight stable from last year. Doing more weight lifting and has added some muscle mass Wt Readings from Last 3 Encounters:  10/17/18 195 lb (88.5 kg)  10/16/18 194 lb 6.4 oz (88.2 kg)  10/04/17 194 lb (88 kg)  3. Immunizations/screenings/ancillary studies Immunization History  Administered Date(s) Administered  . Influenza Split 07/26/2011, 07/02/2015  . Influenza Whole 06/25/2012  . Influenza, High Dose Seasonal PF 07/19/2017  . Influenza,inj,Quad PF,6+ Mos 07/08/2013, 07/20/2014  . Influenza-Unspecified 07/04/2018  . Pneumococcal Conjugate-13 07/16/2015  . Pneumococcal Polysaccharide-23 06/25/2006, 11/10/2010  . Td 11/23/2004  . Tdap 07/16/2015  . Zoster Recombinat (Shingrix) 07/02/2017, 09/03/2017  4. [facility]- [facility]-past age based screening recommendations. Needs to get a new [facility] though Lab Results  Component Value Date   PSA 0.54 11/10/2010   PSA 0.75 08/05/2009   PSA 0.57 10/08/2006   5. Colon cancer screening -04/02/2013 normal-was advised no repeat due to age per Dr. [name]  6. Skin cancer screening-saw  Dr. Jarome Matin in the last year.  Advised regular sunscreen use. Denies worrisome, changing, or new skin lesions.  7. Former smoker- 2.5 pack years quit over 40 years ago. dont feel we need to repeat UA at this time. AAA screen normal 04/2017  Status of chronic or acute concerns   #Hyperlipidemia-elevated 10-year ASCVD risk.  Patient wants to continue to work on diet and exercise instead of starting statin.  Goal last year was 5 to 10 pound weight loss but he is stable.  He wants to continue aspirin 81 mg.  We will update lipid panel today - he is interested in coronary CT scan if ASCVD risk remains elevated  #GERD-advised Pepcid over Zantac if needed  #Allergic rhinitis-tendency toward sinus infections, had some ups and downs lately- seems to  be affected by barometric pressure as well. . Zyrtec at night is helpful.    #Senile purpura-noted on exam again today.  He does take aspirin which likely contributes. He states vitamin E oil is helpful.   #PTSD-diagnosed at the [place].  No current treatment. Mainly presents as sleep issue and occasional anxiety attacks-seemsto be rare.   Has alprazolam from [place] if needed but prefers not to use medicine  #Mild reduction in MmSE yesterday- this will need to be trended.  Patient denies significant memory issues   BMI monitoring- elevated BMI noted: Body mass index is 25.73 kg/m. Encouraged need for healthy eating, regular exercise to be continued-I actually think his weight is reasonable for anxiety  Future Appointments  Date Time Provider [place]  10/22/2019  3:00 PM [facility] CO[facility]   Lab/Order associations: cereal this morning at 7 30 AM. Now nearly 3 PM.   Preventative health care - Plan: CBC, Comprehensive metabolic panel, Lipid panel  Senile purpura (HCC)  Hyperlipidemia LDL goal <130 - Plan: CBC, Comprehensive metabolic panel, Lipid panel  Chronic rhinitis  Insomnia, unspecified type  Former smoker  BMI  25.0-25.9,adult  Return precautions advised.  Garret Reddish, MD

## 2018-10-17 NOTE — [facility] Notes (Signed)
I have reviewed and agree with note, evaluation, plan.  Need to trend MMSE  Garret Reddish, MD

## 2018-10-17 NOTE — Patient Instructions (Addendum)
advised Pepcid over Zantac if needed  No other changes  We did discuss if cholesterol is still high- considering coronary CT scan to get more information about your volume of plaque  Please stop by lab before you go If you do not have mychart- we will call you about results within 5 business days of [place] receiving them.  If you have mychart- we will send your results within 3 business days of [place] receiving them.  If abnormal or we want to clarify a result, we will call or mychart you to make sure you receive the message.  If you have questions or concerns or don't hear within 5-7 days, please send [place] a message or call us.

## 2018-10-18 LAB — CBC
HEMATOCRIT: 43.7 % (ref 39.0–52.0)
HEMOGLOBIN: 14.7 g/dL (ref 13.0–17.0)
MCHC: 33.6 g/dL (ref 30.0–36.0)
MCV: 94.2 fl (ref 78.0–100.0)
PLATELETS: 251 10*3/uL (ref 150.0–400.0)
RBC: 4.64 Mil/uL (ref 4.22–5.81)
RDW: 14 % (ref 11.5–15.5)
WBC: 6.3 10*3/uL (ref 4.0–10.5)

## 2018-10-18 LAB — LIPID PANEL
CHOL/HDL RATIO: 4
Cholesterol: 197 mg/dL (ref 0–200)
HDL: 52.3 mg/dL (ref >39.00)
LDL CALC: 131 mg/dL — AB (ref 0–99)
NonHDL: 144.36
TRIGLYCERIDES: 69 mg/dL (ref 0.0–149.0)
VLDL: 13.8 mg/dL (ref 0.0–40.0)

## 2018-10-18 LAB — COMPREHENSIVE METABOLIC PANEL
ALT: 18 U/L (ref 0–53)
AST: 19 U/L (ref 0–37)
Albumin: 4.3 g/dL (ref 3.5–5.2)
Alkaline Phosphatase: 60 U/L (ref 39–117)
BUN: 20 mg/dL (ref 6–23)
CALCIUM: 9.2 mg/dL (ref 8.4–10.5)
CHLORIDE: 104 meq/L (ref 96–112)
CO2: 29 meq/L (ref 19–32)
Creatinine, Ser: 1.14 mg/dL (ref 0.40–1.50)
GFR: 62.92 mL/min (ref >60.00)
Glucose, Bld: 87 mg/dL (ref 70–99)
POTASSIUM: 4.3 meq/L (ref 3.5–5.1)
Sodium: 140 mEq/L (ref 135–145)
Total Bilirubin: 0.7 mg/dL (ref 0.2–1.2)
Total Protein: 7.2 g/dL (ref 6.0–8.3)

## 2018-10-18 NOTE — Addendum Note (Signed)
Addended by: Marin Olp on: 10/18/2018 04:41 PM   Modules accepted: Orders

## 2018-11-19 ENCOUNTER — Ambulatory Visit (INDEPENDENT_AMBULATORY_CARE_PROVIDER_SITE_OTHER)
Admission: RE | Admit: 2018-11-19 | Discharge: 2018-11-19 | Disposition: A | Discharge status: Home / Self Care | Payer: Self-pay | Source: Ambulatory Visit | Attending: Family Medicine | Admitting: Family Medicine

## 2018-11-19 ENCOUNTER — Encounter (INDEPENDENT_AMBULATORY_CARE_PROVIDER_SITE_OTHER): Payer: Self-pay

## 2018-11-19 DIAGNOSIS — E785 Hyperlipidemia, unspecified: Secondary | ICD-10-CM

## 2018-11-19 DIAGNOSIS — M170 Bilateral primary osteoarthritis of knee: Secondary | ICD-10-CM | POA: Diagnosis not present

## 2018-11-25 ENCOUNTER — Other Ambulatory Visit: Payer: Self-pay

## 2018-11-25 MED ORDER — ROSUVASTATIN CALCIUM 20 MG PO TABS: ORAL_TABLET | ORAL | 0 refills | Status: DC

## 2018-12-06 DIAGNOSIS — D485 Neoplasm of uncertain behavior of skin: Secondary | ICD-10-CM | POA: Diagnosis not present

## 2018-12-06 DIAGNOSIS — L720 Epidermal cyst: Secondary | ICD-10-CM | POA: Diagnosis not present

## 2018-12-06 DIAGNOSIS — L738 Other specified follicular disorders: Secondary | ICD-10-CM | POA: Diagnosis not present

## 2018-12-06 DIAGNOSIS — D225 Melanocytic nevi of trunk: Secondary | ICD-10-CM | POA: Diagnosis not present

## 2019-01-16 DIAGNOSIS — L720 Epidermal cyst: Secondary | ICD-10-CM | POA: Diagnosis not present

## 2019-02-11 ENCOUNTER — Other Ambulatory Visit: Payer: Self-pay | Admitting: Family Medicine

## 2019-05-01 ENCOUNTER — Other Ambulatory Visit: Payer: Self-pay | Admitting: Family Medicine

## 2019-05-09 DIAGNOSIS — L821 Other seborrheic keratosis: Secondary | ICD-10-CM | POA: Diagnosis not present

## 2019-05-09 DIAGNOSIS — C44729 Squamous cell carcinoma of skin of left lower limb, including hip: Secondary | ICD-10-CM | POA: Diagnosis not present

## 2019-05-14 ENCOUNTER — Telehealth: Payer: Self-pay | Admitting: Family Medicine

## 2019-05-14 NOTE — Telephone Encounter (Signed)
Pt called in to request to have a letter typed up stating that he need exercise so that he can work out at [employer]. Pt says that it is required now due to covid for him to use facility.   CB: [phone]

## 2019-05-14 NOTE — Telephone Encounter (Signed)
Please advise if letter is ok to write

## 2019-05-14 NOTE — Telephone Encounter (Signed)
Letter at the front for pick up. Called pt and advised.

## 2019-05-14 NOTE — Telephone Encounter (Signed)
See below

## 2019-05-14 NOTE — Telephone Encounter (Signed)
Yes thanks  To whom it may concern,  Mr. Desroches is a patient of mine.  Due to underlying medical conditions, I recommend he exercise regularly-please allow him to exercise at the [facility] while following appropriate precautions.  Thanks,  Garret Reddish

## 2019-06-18 LAB — LIPID PANEL
Cholesterol: 169 (ref 0–200)
HDL: 52 (ref 35–70)
Triglycerides: 80 (ref 40–160)

## 2019-06-18 LAB — IRON,TIBC AND FERRITIN PANEL
Ferritin: 137.6
Iron: 136
TIBC: 316

## 2019-06-18 LAB — BASIC METABOLIC PANEL
BUN: 16 (ref 4–21)
Glucose: 83
Sodium: 137 (ref 137–147)

## 2019-06-18 LAB — PSA: PSA: 0.63

## 2019-06-20 DIAGNOSIS — M1712 Unilateral primary osteoarthritis, left knee: Secondary | ICD-10-CM | POA: Diagnosis not present

## 2019-06-20 DIAGNOSIS — M25562 Pain in left knee: Secondary | ICD-10-CM | POA: Diagnosis not present

## 2019-07-08 NOTE — Patient Instructions (Addendum)
Health Maintenance Due  Topic Date Due  . INFLUENZA VACCINE -today 04/26/2019   Bevelyn Ngo will get an ekg on you   Please stop by lab before you go If you do not have mychart- we will call you about results within 5 business days of [place] receiving them.  If you have mychart- we will send your results within 3 business days of [place] receiving them.  If abnormal or we want to clarify a result, we will call or mychart you to make sure you receive the message.  If you have questions or concerns or don't hear within 5-7 days, please send [place] a message or call us.   If labs are good I do not see reason why we cannot proceed forward with surgery- certainly hope you have a quick recovery and get to feeling better soon

## 2019-07-08 NOTE — Progress Notes (Signed)
Phone [phone]   Subjective:  Joshua [name] is a 73 y.o. year old very pleasant male patient who presents for/with See problem oriented charting Chief Complaint  Patient presents with  . Follow-up  . surgery clearance    knee replacement   ROS- No chest pain or shortness of breath. No headache or blurry vision.    Past Medical History-  Patient Active Problem List   Diagnosis Date Noted  . Insomnia 07/18/2015    Priority: Medium  . Hyperlipidemia LDL goal <130 09/16/2008    Priority: Medium  . Chronic rhinitis 09/16/2008    Priority: Medium  . Former smoker 03/26/2017    Priority: Low  . GERD (gastroesophageal reflux disease) 03/26/2017    Priority: Low  . Allergic rhinitis 03/26/2017    Priority: Low  . Cervical spondylosis with radiculopathy 11/30/2015    Priority: Low  . TOS (thoracic outlet syndrome)     Priority: Low  . Cough 10/27/2008    Priority: Low  . Osteoarthritis of right knee 09/16/2008    Priority: Low  . VERTIGO 09/16/2008    Priority: Low  . Senile purpura ([place]) 10/04/2017    Medications- reviewed and updated Current Outpatient Medications  Medication Sig Dispense Refill  . cetirizine (ZYRTEC) 10 MG tablet Take 10 mg by mouth at bedtime.    . rosuvastatin (CRESTOR) 20 MG tablet TAKE 1 TABLET BY MOUTH ONE TIME PER WEEK 12 tablet 0  . tamsulosin (FLOMAX) 0.4 MG CAPS capsule Take 0.4 mg by mouth daily.     No current facility-administered medications for this visit.      Objective:  BP 120/72   Pulse 67   Temp 98.2 F (36.8 C)   Ht 6\' 1"  (1.854 m)   Wt 198 lb 12.8 oz (90.2 kg)   SpO2 98%   BMI 26.23 kg/m  Gen: NAD, resting comfortably CV: RRR no murmurs rubs or gallops Lungs: CTAB no crackles, wheeze, rhonchi Abdomen: soft/nontender/nondistended/normal bowel sounds.  Ext: no edema Skin: warm, dry  EKG: sinus rhythm with rate 61 with 1 PAC, right axis, normal intervals, no hypertrophy, no signfiicant st or t wave changes     Assessment and Plan  Surgery clearance S:pt is having a knee replacement on 07/29/2019 with Dr. Elmyra Ricks.  EKG along with PT/INR was requested as part of work-up performed we received today.  A1c was also requested and this was performed under overweight status  Can do 4 mets without difficulty from chest pain/shortness of breath perspective- denies any issues- does have to tolerate the knee pain  With more intense activities A/P: See discussion below on lipidsbut in my opinion patient is medically maximized for surgery even if LDL slightly high - ortho also requested UA- was not resulted today- will see if can be input tomorrow or if lab needs to bring patient back in.   Hyperlipidemia LDL goal <130 S:Coronary calcium score of 161 November 19, 2018 20-46 percentile for age.  Rosuvastatin 20 mg once weekly was started-patient would like to be on low-dose if possible-possible increase in myalgias on this dose A/P: We opted to update blood work today with direct LDL-we will target LDL at least under 100-titrate up rosuvastatin as needed  Recommended follow up: Already scheduled for January follow-up Future Appointments  Date Time Provider [place]  10/22/2019  3:00 PM [facility] HE[facility] COACH [facility]  10/23/2019  9:40 AM Marin Olp, MD [facility]   Lab/Order associations:   ICD-10-CM  1. Preoperative evaluation to rule out surgical contraindication  Z01.818 CBC    Comprehensive metabolic panel    Hemoglobin A1c    POCT Urinalysis Dipstick (Automated)    Protime-INR    EKG 12-Lead    LDL cholesterol, direct  2. Hyperlipidemia LDL goal <130  E78.5 CBC    Comprehensive metabolic panel    POCT Urinalysis Dipstick (Automated)    EKG 12-Lead    LDL cholesterol, direct  3. Overweight  E66.3 Hemoglobin A1c  4. Need for immunization against influenza  Z23 Flu Vaccine QUAD High Dose(Fluad)   Return precautions advised.  Garret Reddish, MD

## 2019-07-14 ENCOUNTER — Encounter: Payer: Self-pay | Admitting: Family Medicine

## 2019-07-14 ENCOUNTER — Ambulatory Visit (INDEPENDENT_AMBULATORY_CARE_PROVIDER_SITE_OTHER): Payer: Medicare Other | Admitting: Family Medicine

## 2019-07-14 ENCOUNTER — Other Ambulatory Visit: Payer: Self-pay

## 2019-07-14 VITALS — BP 120/72 | HR 67 | Temp 98.2°F | Ht 73.0 in | Wt 198.8 lb

## 2019-07-14 DIAGNOSIS — Z01818 Encounter for other preprocedural examination: Secondary | ICD-10-CM | POA: Diagnosis not present

## 2019-07-14 DIAGNOSIS — E785 Hyperlipidemia, unspecified: Secondary | ICD-10-CM | POA: Diagnosis not present

## 2019-07-14 DIAGNOSIS — E663 Overweight: Secondary | ICD-10-CM | POA: Diagnosis not present

## 2019-07-14 DIAGNOSIS — Z23 Encounter for immunization: Secondary | ICD-10-CM

## 2019-07-14 NOTE — Assessment & Plan Note (Signed)
S:Coronary calcium score of 161 November 19, 2018 20-46 percentile for age.  Rosuvastatin 20 mg once weekly was started-patient would like to be on low-dose if possible-possible increase in myalgias on this dose A/P: We opted to update blood work today with direct LDL-we will target LDL at least under 100-titrate up rosuvastatin as needed

## 2019-07-15 ENCOUNTER — Other Ambulatory Visit (INDEPENDENT_AMBULATORY_CARE_PROVIDER_SITE_OTHER): Payer: Medicare Other

## 2019-07-15 DIAGNOSIS — E785 Hyperlipidemia, unspecified: Secondary | ICD-10-CM

## 2019-07-15 DIAGNOSIS — Z01818 Encounter for other preprocedural examination: Secondary | ICD-10-CM

## 2019-07-15 LAB — COMPREHENSIVE METABOLIC PANEL
ALT: 25 U/L (ref 0–53)
AST: 24 U/L (ref 0–37)
Albumin: 4.6 g/dL (ref 3.5–5.2)
Alkaline Phosphatase: 62 U/L (ref 39–117)
BUN: 17 mg/dL (ref 6–23)
CO2: 30 mEq/L (ref 19–32)
Calcium: 9.2 mg/dL (ref 8.4–10.5)
Chloride: 103 mEq/L (ref 96–112)
Creatinine, Ser: 1.17 mg/dL (ref 0.40–1.50)
GFR: 60.94 mL/min (ref >60.00)
Glucose, Bld: 88 mg/dL (ref 70–99)
Potassium: 4.5 mEq/L (ref 3.5–5.1)
Sodium: 139 mEq/L (ref 135–145)
Total Bilirubin: 0.8 mg/dL (ref 0.2–1.2)
Total Protein: 7.4 g/dL (ref 6.0–8.3)

## 2019-07-15 LAB — CBC
HCT: 43.6 % (ref 39.0–52.0)
Hemoglobin: 14.8 g/dL (ref 13.0–17.0)
MCHC: 34 g/dL (ref 30.0–36.0)
MCV: 92.9 fl (ref 78.0–100.0)
Platelets: 255 10*3/uL (ref 150.0–400.0)
RBC: 4.7 Mil/uL (ref 4.22–5.81)
RDW: 13.9 % (ref 11.5–15.5)
WBC: 6.1 10*3/uL (ref 4.0–10.5)

## 2019-07-15 LAB — POC URINALSYSI DIPSTICK (AUTOMATED)
Bilirubin, UA: NEGATIVE
Blood, UA: NEGATIVE
Glucose, UA: NEGATIVE
Ketones, UA: NEGATIVE
Leukocytes, UA: NEGATIVE
Nitrite, UA: NEGATIVE
Protein, UA: NEGATIVE
Spec Grav, UA: 1.02 (ref 1.010–1.025)
Urobilinogen, UA: 0.2 E.U./dL
pH, UA: 6 (ref 5.0–8.0)

## 2019-07-15 LAB — LDL CHOLESTEROL, DIRECT: Direct LDL: 99 mg/dL

## 2019-07-15 LAB — HEMOGLOBIN A1C: Hgb A1c MFr Bld: 5.1 % (ref 4.6–6.5)

## 2019-07-15 LAB — PROTIME-INR
INR: 1 ratio (ref 0.8–1.0)
Prothrombin Time: 12.2 s (ref 9.6–13.1)

## 2019-07-21 ENCOUNTER — Other Ambulatory Visit: Payer: Self-pay | Admitting: Family Medicine

## 2019-08-19 NOTE — Patient Instructions (Addendum)
DUE TO COVID-19 ONLY ONE VISITOR IS ALLOWED TO COME WITH YOU AND STAY IN THE WAITING ROOM ONLY DURING PRE OP AND PROCEDURE DAY OF SURGERY. THE 1 VISITOR MAY VISIT WITH YOU AFTER SURGERY IN YOUR PRIVATE ROOM DURING VISITING HOURS ONLY!  YOU NEED TO HAVE A COVID 19 TEST ON_11/27______ @_8 :45______, THIS TEST MUST BE DONE BEFORE SURGERY, COME  [address], [place] , [address].  ([place]) ONCE YOUR COVID TEST IS COMPLETED, PLEASE BEGIN THE QUARANTINE INSTRUCTIONS AS OUTLINED IN YOUR HANDOUT.                Joshua [name]    Your procedure is scheduled on: 08/25/19   Report to [facility] Main  Entrance   Report to admitting at  5:50 AM     Call this number if you have problems the morning of surgery [phone]    . BRUSH YOUR TEETH MORNING OF SURGERY AND RINSE YOUR MOUTH OUT, NO CHEWING GUM CANDY OR MINTS.    Do not eat food After Midnight.   YOU MAY HAVE CLEAR LIQUIDS FROM MIDNIGHT UNTIL 5:15AM.   At 5:15 AM Please finish the prescribed Pre-Surgery  drink   Nothing by mouth after you finish the  drink !    Take these medicines the morning of surgery with A SIP OF WATER: none                                 You may not have any metal on your body including piercings              Do not wear jewelry,  lotions, powders or  deodorant                    Men may shave face and neck.   Do not bring valuables to the hospital. [place].  Contacts, dentures or bridgework may not be worn into surgery.      Patients discharged the day of surgery will not be allowed to drive home.   IF YOU ARE HAVING SURGERY AND GOING HOME THE SAME DAY, YOU MUST HAVE AN ADULT TO DRIVE YOU HOME AND BE WITH YOU FOR 24 HOURS.   YOU MAY GO HOME BY TAXI OR UBER OR ORTHERWISE, BUT AN ADULT MUST ACCOMPANY YOU HOME AND STAY WITH YOU FOR 24 HOURS.  Name and phone number of your driver:  Special Instructions: N/A               Please read over the following fact sheets you were given: _____________________________________________________________________             [facility] - Preparing for Surgery  Before surgery, you can play an important role.   Because skin is not sterile, your skin needs to be as free of germs as possible .  You can reduce the number of germs on your skin by washing with CHG (chlorahexidine gluconate) soap before surgery.   CHG is an antiseptic cleaner which kills germs and bonds with the skin to continue killing germs even after washing. Please DO NOT use if you have an allergy to CHG or antibacterial soaps.   If your skin becomes reddened/irritated stop using the CHG and inform your nurse when you arrive at Short Stay.  You may shave your face/neck.  Please follow these instructions carefully:  1.  Shower with CHG Soap the night before surgery and the  morning of Surgery.  2.  If you choose to wash your hair, wash your hair first as usual with your  normal  shampoo.  3.  After you shampoo, rinse your hair and body thoroughly to remove the  shampoo.                                        4.  Use CHG as you would any other liquid soap.  You can apply chg directly  to the skin and wash                       Gently with a scrungie or clean washcloth.  5.  Apply the CHG Soap to your body ONLY FROM THE NECK DOWN.   Do not use on face/ open                           Wound or open sores. Avoid contact with eyes, ears mouth and genitals (private parts).                       Wash face,  Genitals (private parts) with your normal soap.             6.  Wash thoroughly, paying special attention to the area where your surgery  will be performed.  7.  Thoroughly rinse your body with warm water from the neck down.  8.  DO NOT shower/wash with your normal soap after using and rinsing off  the CHG Soap.             9.  Pat yourself dry with a clean towel.            10.  Wear clean pajamas.             11.  Place clean sheets on your bed the night of your first shower and do not  sleep with pets. Day of Surgery : Do not apply any lotions/deodorants the morning of surgery.  Please wear clean clothes to the hospital/surgery center.  FAILURE TO FOLLOW THESE INSTRUCTIONS MAY RESULT IN THE CANCELLATION OF YOUR SURGERY PATIENT SIGNATURE_________________________________  NURSE SIGNATURE__________________________________  ________________________________________________________________________   Adam Phenix  An incentive spirometer is a tool that can help keep your lungs clear and active. This tool measures how well you are filling your lungs with each breath. Taking long deep breaths may help reverse or decrease the chance of developing breathing (pulmonary) problems (especially infection) following:  A long period of time when you are unable to move or be active. BEFORE THE PROCEDURE   If the spirometer includes an indicator to show your best effort, your nurse or respiratory therapist will set it to a desired goal.  If possible, sit up straight or lean slightly forward. Try not to slouch.  Hold the incentive spirometer in an upright position. INSTRUCTIONS FOR USE  1. Sit on the edge of your bed if possible, or sit up as far as you can in bed or on a chair. 2. Hold the incentive spirometer in an upright position. 3. Breathe out normally. 4. Place the mouthpiece in your mouth and seal your lips tightly around  it. 5. Breathe in slowly and as deeply as possible, raising the piston or the ball toward the top of the column. 6. Hold your breath for 3-5 seconds or for as long as possible. Allow the piston or ball to fall to the bottom of the column. 7. Remove the mouthpiece from your mouth and breathe out normally. 8. Rest for a few seconds and repeat Steps 1 through 7 at least 10 times every 1-2 hours when you are awake. Take your time and take a few normal breaths between deep  breaths. 9. The spirometer may include an indicator to show your best effort. Use the indicator as a goal to work toward during each repetition. 10. After each set of 10 deep breaths, practice coughing to be sure your lungs are clear. If you have an incision (the cut made at the time of surgery), support your incision when coughing by placing a pillow or rolled up towels firmly against it. Once you are able to get out of bed, walk around indoors and cough well. You may stop using the incentive spirometer when instructed by your caregiver.  RISKS AND COMPLICATIONS  Take your time so you do not get dizzy or light-headed.  If you are in pain, you may need to take or ask for pain medication before doing incentive spirometry. It is harder to take a deep breath if you are having pain. AFTER USE  Rest and breathe slowly and easily.  It can be helpful to keep track of a log of your progress. Your caregiver can provide you with a simple table to help with this. If you are using the spirometer at home, follow these instructions: [place] IF:   You are having difficultly using the spirometer.  You have trouble using the spirometer as often as instructed.  Your pain medication is not giving enough relief while using the spirometer.  You develop fever of 100.5 F (38.1 C) or higher. SEEK IMMEDIATE MEDICAL CARE IF:   You cough up bloody sputum that had not been present before.  You develop fever of 102 F (38.9 C) or greater.  You develop worsening pain at or near the incision site. MAKE SURE YOU:   Understand these instructions.  Will watch your condition.  Will get help right away if you are not doing well or get worse. Document Released: 01/22/2007 Document Revised: 12/04/2011 Document Reviewed: 03/25/2007 ExitCare Patient Information 2014 [employer], [place].   ________________________________________________________________________  WHAT IS A BLOOD TRANSFUSION? Blood  Transfusion Information  A transfusion is the replacement of blood or some of its parts. Blood is made up of multiple cells which provide different functions.  Red blood cells carry oxygen and are used for blood loss replacement.  White blood cells fight against infection.  Platelets control bleeding.  Plasma helps clot blood.  Other blood products are available for specialized needs, such as hemophilia or other clotting disorders. BEFORE THE TRANSFUSION  Who gives blood for transfusions?   Healthy volunteers who are fully evaluated to make sure their blood is safe. This is blood bank blood. Transfusion therapy is the safest it has ever been in the practice of medicine. Before blood is taken from a donor, a complete history is taken to make sure that person has no history of diseases nor engages in risky social behavior (examples are intravenous drug use or sexual activity with multiple partners). The donor's travel history is screened to minimize risk of transmitting infections, such as malaria. The donated  blood is tested for signs of infectious diseases, such as HIV and hepatitis. The blood is then tested to be sure it is compatible with you in order to minimize the chance of a transfusion reaction. If you or a relative donates blood, this is often done in anticipation of surgery and is not appropriate for emergency situations. It takes many days to process the donated blood. RISKS AND COMPLICATIONS Although transfusion therapy is very safe and saves many lives, the main dangers of transfusion include:   Getting an infectious disease.  Developing a transfusion reaction. This is an allergic reaction to something in the blood you were given. Every precaution is taken to prevent this. The decision to have a blood transfusion has been considered carefully by your caregiver before blood is given. Blood is not given unless the benefits outweigh the risks. AFTER THE TRANSFUSION  Right after  receiving a blood transfusion, you will usually feel much better and more energetic. This is especially true if your red blood cells have gotten low (anemic). The transfusion raises the level of the red blood cells which carry oxygen, and this usually causes an energy increase.  The nurse administering the transfusion will monitor you carefully for complications. HOME CARE INSTRUCTIONS  No special instructions are needed after a transfusion. You may find your energy is better. Speak with your caregiver about any limitations on activity for underlying diseases you may have. SEEK MEDICAL CARE IF:   Your condition is not improving after your transfusion.  You develop redness or irritation at the intravenous (IV) site. SEEK IMMEDIATE MEDICAL CARE IF:  Any of the following symptoms occur over the next 12 hours:  Shaking chills.  You have a temperature by mouth above 102 F (38.9 C), not controlled by medicine.  Chest, back, or muscle pain.  People around you feel you are not acting correctly or are confused.  Shortness of breath or difficulty breathing.  Dizziness and fainting.  You get a rash or develop hives.  You have a decrease in urine output.  Your urine turns a dark color or changes to pink, red, or brown. Any of the following symptoms occur over the next 10 days:  You have a temperature by mouth above 102 F (38.9 C), not controlled by medicine.  Shortness of breath.  Weakness after normal activity.  The white part of the eye turns yellow (jaundice).  You have a decrease in the amount of urine or are urinating less often.  Your urine turns a dark color or changes to pink, red, or brown. Document Released: 09/08/2000 Document Revised: 12/04/2011 Document Reviewed: 04/27/2008 [facility] Patient Information 2014 [place], [place].  _______________________________________________________________________

## 2019-08-20 ENCOUNTER — Encounter (HOSPITAL_COMMUNITY)
Admission: RE | Admit: 2019-08-20 | Discharge: 2019-08-20 | Disposition: A | Discharge status: Home / Self Care | Payer: Medicare Other | Source: Ambulatory Visit | Attending: Orthopedic Surgery | Admitting: Orthopedic Surgery

## 2019-08-20 ENCOUNTER — Encounter (HOSPITAL_COMMUNITY): Payer: Self-pay

## 2019-08-20 ENCOUNTER — Other Ambulatory Visit: Payer: Self-pay

## 2019-08-20 DIAGNOSIS — E785 Hyperlipidemia, unspecified: Secondary | ICD-10-CM | POA: Diagnosis not present

## 2019-08-20 DIAGNOSIS — Z79899 Other long term (current) drug therapy: Secondary | ICD-10-CM | POA: Diagnosis not present

## 2019-08-20 DIAGNOSIS — Z87891 Personal history of nicotine dependence: Secondary | ICD-10-CM | POA: Insufficient documentation

## 2019-08-20 DIAGNOSIS — M1712 Unilateral primary osteoarthritis, left knee: Secondary | ICD-10-CM | POA: Insufficient documentation

## 2019-08-20 DIAGNOSIS — Z01812 Encounter for preprocedural laboratory examination: Secondary | ICD-10-CM | POA: Insufficient documentation

## 2019-08-20 HISTORY — DX: Essential (primary) hypertension: I10

## 2019-08-20 LAB — COMPREHENSIVE METABOLIC PANEL
ALT: 22 U/L (ref 0–44)
AST: 23 U/L (ref 15–41)
Albumin: 4.6 g/dL (ref 3.5–5.0)
Alkaline Phosphatase: 60 U/L (ref 38–126)
Anion gap: 7 (ref 5–15)
BUN: 15 mg/dL (ref 8–23)
CO2: 27 mmol/L (ref 22–32)
Calcium: 8.9 mg/dL (ref 8.9–10.3)
Chloride: 105 mmol/L (ref 98–111)
Creatinine, Ser: 1.1 mg/dL (ref 0.61–1.24)
GFR calc Af Amer: >60 mL/min (ref >60)
GFR calc non Af Amer: >60 mL/min (ref >60)
Glucose, Bld: 98 mg/dL (ref 70–99)
Potassium: 4.9 mmol/L (ref 3.5–5.1)
Sodium: 139 mmol/L (ref 135–145)
Total Bilirubin: 1.3 mg/dL — ABNORMAL HIGH (ref 0.3–1.2)
Total Protein: 7.9 g/dL (ref 6.5–8.1)

## 2019-08-20 LAB — SURGICAL PCR SCREEN
MRSA, PCR: NEGATIVE
Staphylococcus aureus: NEGATIVE

## 2019-08-20 LAB — CBC WITH DIFFERENTIAL/PLATELET
Abs Immature Granulocytes: 0.02 10*3/uL (ref 0.00–0.07)
Basophils Absolute: 0 10*3/uL (ref 0.0–0.1)
Basophils Relative: 1 %
Eosinophils Absolute: 0.3 10*3/uL (ref 0.0–0.5)
Eosinophils Relative: 5 %
HCT: 46.3 % (ref 39.0–52.0)
Hemoglobin: 15.1 g/dL (ref 13.0–17.0)
Immature Granulocytes: 0 %
Lymphocytes Relative: 27 %
Lymphs Abs: 1.6 10*3/uL (ref 0.7–4.0)
MCH: 31.1 pg (ref 26.0–34.0)
MCHC: 32.6 g/dL (ref 30.0–36.0)
MCV: 95.5 fL (ref 80.0–100.0)
Monocytes Absolute: 0.6 10*3/uL (ref 0.1–1.0)
Monocytes Relative: 10 %
Neutro Abs: 3.5 10*3/uL (ref 1.7–7.7)
Neutrophils Relative %: 57 %
Platelets: 243 10*3/uL (ref 150–400)
RBC: 4.85 MIL/uL (ref 4.22–5.81)
RDW: 13.2 % (ref 11.5–15.5)
WBC: 6 10*3/uL (ref 4.0–10.5)
nRBC: 0 % (ref 0.0–0.2)

## 2019-08-20 LAB — PROTIME-INR
INR: 1.1 (ref 0.8–1.2)
Prothrombin Time: 13.7 seconds (ref 11.4–15.2)

## 2019-08-20 LAB — APTT: aPTT: 33 seconds (ref 24–36)

## 2019-08-20 NOTE — Progress Notes (Signed)
PCP - Dr. Ansel Bong Cardiologist - none  Chest x-ray - no EKG - 07/14/19 Stress Test - no ECHO - no Cardiac Cath - no Cardiac scoring CT-11/19/18  Sleep Study - no CPAP -   Fasting Blood Sugar - NA Checks Blood Sugar _____ times a day  Blood Thinner Instructions: NA Aspirin Instructions: Last Dose:  Anesthesia review:   Patient denies shortness of breath, fever, cough and chest pain at PAT appointment yes  Patient verbalized understanding of instructions that were given to them at the PAT appointment. Patient was also instructed that they will need to review over the PAT instructions again at home before surgery. yes

## 2019-08-22 ENCOUNTER — Other Ambulatory Visit (HOSPITAL_COMMUNITY)
Admission: RE | Admit: 2019-08-22 | Discharge: 2019-08-22 | Disposition: A | Discharge status: Home / Self Care | Payer: Medicare Other | Source: Ambulatory Visit | Attending: Orthopedic Surgery | Admitting: Orthopedic Surgery

## 2019-08-22 DIAGNOSIS — Z01812 Encounter for preprocedural laboratory examination: Secondary | ICD-10-CM | POA: Insufficient documentation

## 2019-08-22 DIAGNOSIS — Z20828 Contact with and (suspected) exposure to other viral communicable diseases: Secondary | ICD-10-CM | POA: Insufficient documentation

## 2019-08-22 LAB — SARS CORONAVIRUS 2 (TAT 6-24 HRS): SARS Coronavirus 2: NEGATIVE

## 2019-08-22 NOTE — Anesthesia Preprocedure Evaluation (Addendum)
Anesthesia Evaluation  Patient identified by MRN, date of birth, ID band Patient awake    Reviewed: Allergy & Precautions, NPO status , Patient's Chart, lab work & pertinent test results  History of Anesthesia Complications Negative for: history of anesthetic complications  Airway Mallampati: II  TM Distance: >3 FB Neck ROM: Full    Dental no notable dental hx.    Pulmonary former smoker,    Pulmonary exam normal        Cardiovascular hypertension, + Peripheral Vascular Disease  Normal cardiovascular exam     Neuro/Psych Anxiety negative neurological ROS     GI/Hepatic Neg liver ROS, GERD  Controlled,  Endo/Other  negative endocrine ROS  Renal/GU negative Renal ROS     Musculoskeletal  (+) Arthritis ,   Abdominal   Peds  Hematology negative hematology ROS (+)   Anesthesia Other Findings   Reproductive/Obstetrics                           Anesthesia Physical Anesthesia Plan  ASA: II  Anesthesia Plan: Spinal   Post-op Pain Management:  Regional for Post-op pain   Induction:   PONV Risk Score and Plan: 2 and Treatment may vary due to age or medical condition, Ondansetron, Propofol infusion, Dexamethasone and Midazolam  Airway Management Planned: Natural Airway and Simple Face Mask  Additional Equipment: None  Intra-op Plan:   Post-operative Plan:   Informed Consent: I have reviewed the patients History and Physical, chart, labs and discussed the procedure including the risks, benefits and alternatives for the proposed anesthesia with the patient or authorized representative who has indicated his/her understanding and acceptance.     Dental advisory given  Plan Discussed with: CRNA  Anesthesia Plan Comments:        Anesthesia Quick Evaluation

## 2019-08-24 MED ORDER — BUPIVACAINE LIPOSOME 1.3 % IJ SUSP
20.0000 mL | INTRAMUSCULAR | Status: DC
  Filled 2019-08-24: qty 20

## 2019-08-24 NOTE — H&P (Signed)
TOTAL KNEE ADMISSION H&P  Patient is being admitted for left total knee arthroplasty.  Subjective:  Chief Complaint:left knee pain.  HPI: Joshua [name], 73 y.o. male, has a history of pain and functional disability in the left knee due to arthritis and has failed non-surgical conservative treatments for greater than 12 weeks to includeNSAID's and/or analgesics, corticosteriod injections, flexibility and strengthening excercises and activity modification.  Onset of symptoms was gradual, starting 5 years ago with gradually worsening course since that time. The patient noted no past surgery on the left knee(s).  Patient currently rates pain in the left knee(s) at 5 out of 10 with activity. Patient has night pain, worsening of pain with activity and weight bearing, pain that interferes with activities of daily living, pain with passive range of motion, crepitus and joint swelling.  Patient has evidence of periarticular osteophytes and joint space narrowing by imaging studies.There is no active infection.  Patient Active Problem List   Diagnosis Date Noted  . Senile purpura ([place]) 10/04/2017  . Former smoker 03/26/2017  . GERD (gastroesophageal reflux disease) 03/26/2017  . Allergic rhinitis 03/26/2017  . Cervical spondylosis with radiculopathy 11/30/2015  . Insomnia 07/18/2015  . TOS (thoracic outlet syndrome)   . Cough 10/27/2008  . Hyperlipidemia LDL goal <130 09/16/2008  . Chronic rhinitis 09/16/2008  . Osteoarthritis of right knee 09/16/2008  . VERTIGO 09/16/2008   Past Medical History:  Diagnosis Date  . Chronic anxiety   . Chronic rhinitis   . Claustrophobia   . Diverticulosis of colon (without mention of hemorrhage)   . DJD (degenerative joint disease)    OA AND PAIN RIGHT KNEE. DDD including fusions lumbar spine  . GERD (gastroesophageal reflux disease)    ONLY WITH CERTAIN FOODS  . History of total right knee replacement   . Hx of vertigo   . Hyperlipidemia   . Hypertension    . 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 TOS (thoracic outlet syndrome)    Saw a physical therapist--PAIN RIGHT SHOULDER- MUCH IMPROVED    Past Surgical History:  Procedure Laterality Date  . ANTERIOR CERVICAL DECOMP/DISCECTOMY FUSION N/A 11/30/2015   Procedure: CERVICAL SEVEN-THORACIC ONE ANTERIOR CERVICAL DISCECTOMY/FUSION;  Surgeon: Earnie Larsson, MD;  Location: [facility];  Service: Neurosurgery;  Laterality: N/A;  . CERVICAL DISC SURGERY  2009   c3-4 5-6/ by Dr Trenton Gammon- FUSION  . COLONOSCOPY    . TONSILLECTOMY     AS A CHILD  . TOTAL KNEE ARTHROPLASTY Right 07/21/2014   Procedure: TOTAL RIGHT KNEE ARTHROPLASTY;  Surgeon: Mauri Pole, MD;  Location: [facility];  Service: Orthopedics;  Laterality: Right;    Current Facility-Administered Medications  Medication Dose Route Frequency Provider Last Rate Last Dose  . [START ON 08/25/2019] bupivacaine liposome (EXPAREL) 1.3 % injection 266 mg  20 mL Other On Call to OR Gaynelle Arabian, MD       Current Outpatient Medications  Medication Sig Dispense Refill Last Dose  . Fexofenadine HCl (ALLEGRA PO) Take 1 tablet by mouth daily as needed (Allergies).     . Melatonin 10 MG TABS Take 10 mg by mouth at bedtime.     . rosuvastatin (CRESTOR) 20 MG tablet TAKE 1 TABLET BY MOUTH ONE TIME PER WEEK (Patient taking differently: Take 10 mg by mouth once a week. ) 12 tablet 0    No Known Allergies  Social History   Tobacco Use  . Smoking status: Former Smoker    Packs/day:  0.50    Years: 5.00    Pack years: 2.50    Types: Cigarettes    Quit date: 09/25/1973    Years since quitting: 45.9  . Smokeless tobacco: Never Used  Substance Use Topics  . Alcohol use: Yes    Alcohol/week: 7.0 standard drinks    Types: 7 Standard drinks or equivalent per week    Comment:  2 drinks per night weekend, vodka & grapefruit juice     Family History  Problem Relation Age of Onset  . Other Mother        45- "old age". long health in  grandparents as well  . Other Father        84- "old age"  . Colon polyps Father        adenomas  . Healthy Sister   . Colon cancer Paternal Aunt      Review of Systems  Constitutional: Negative.   HENT: Negative.   Eyes: Negative.   Respiratory: Negative.   Cardiovascular: Positive for leg swelling. Negative for chest pain, palpitations, orthopnea, claudication and PND.  Gastrointestinal: Negative.   Genitourinary: Negative.   Musculoskeletal: Positive for joint pain and myalgias. Negative for back pain, falls and neck pain.  Skin: Negative.   Neurological: Negative.   Endo/Heme/Allergies: Negative for environmental allergies and polydipsia. Bruises/bleeds easily.  Psychiatric/Behavioral: Negative.     Objective:  Physical Exam  Constitutional: He is oriented to person, place, and time. He appears well-developed and well-nourished. No distress.  HENT:  Head: Normocephalic and atraumatic.  Eyes: EOM are normal.  Neck: Normal range of motion.  Cardiovascular: Normal rate, regular rhythm, normal heart sounds and intact distal pulses.  Respiratory: Effort normal and breath sounds normal. No respiratory distress. He has no wheezes.  GI: He exhibits no distension. There is no abdominal tenderness.  Musculoskeletal:     Comments: Slightly antalgic gait pattern favoring the left side.  Bilateral Hip Exam: ROM: Normal without discomfort. There is no tenderness over the greater trochanter bursa.  Right Knee Exam: Well-healed scar from previous TKA. No effusion. Range of motion is 0-125 degrees. No crepitus on range of motion of the knee. No medial joint line tenderness No lateral joint line tenderness. No varus/valgus laxity. Tiny amount of AP laxity in flexion, but nothing out of the ordinary.  Left Knee Exam: No effusion. Slight varus deformity. Range of motion is 0-135 degrees. Moderate crepitus on range of motion of the knee. Positive medial, greater than lateral,  joint line tenderness Stable knee.  Neurological: He is alert and oriented to person, place, and time. He has normal strength. No sensory deficit.  Skin: He is not diaphoretic.  Psychiatric: He has a normal mood and affect. His behavior is normal.    Ht: 6 ft 1 in  Wt: 195 lbs  BMI: 25.7  BP: 138/82 sitting R arm  Pulse: 76 bpm regular   Imaging Review Plain radiographs demonstrate severe degenerative joint disease of the left knee(s). The overall alignment ismild varus. The bone quality appears to be good for age and reported activity level.    Assessment/Plan:  End stage primary osteoarthritis, left knee   The patient history, physical examination, clinical judgment of the provider and imaging studies are consistent with end stage degenerative joint disease of the left knee(s) and total knee arthroplasty is deemed medically necessary. The treatment options including medical management, injection therapy arthroscopy and arthroplasty were discussed at length. The risks and benefits of total knee arthroplasty were  presented and reviewed. The risks due to aseptic loosening, infection, stiffness, patella tracking problems, thromboembolic complications and other imponderables were discussed. The patient acknowledged the explanation, agreed to proceed with the plan and consent was signed. Patient is being admitted for inpatient treatment for surgery, pain control, PT, OT, prophylactic antibiotics, VTE prophylaxis, progressive ambulation and ADL's and discharge planning. The patient is planning to be discharged home.     Anticipated LOS equal to or greater than 2 midnights due to - Age 64 and older with one or more of the following:  - Obesity  - Expected need for hospital services (PT, OT, Nursing) required for safe  discharge  - Anticipated need for postoperative skilled nursing care or inpatient rehab  - Active co-morbidities: None OR   - Unanticipated findings during/Post Surgery:  None  - Patient is a high risk of re-admission due to: None    Risks and benefits of the surgery were discussed with the patient and Dr.Aluisio at their previous office visit, and the patient has elected to move forward with the aforementioned surgery. Post-operative care plans were discussed with the patient today.  Therapy Plans: outpatient therapy at [facility] Disposition: Home with wife Planned DVT prophylaxis: aspirin 325mg  BID DME needed: has equipment PCP: Dr. Yong Channel- clearance received Other: no anesthesia concerns  Instructed patient on which meds to stop prior to surgery  Ardeen Jourdain, PA-C

## 2019-08-25 ENCOUNTER — Ambulatory Visit (HOSPITAL_COMMUNITY)
Admission: RE | Admit: 2019-08-25 | Discharge: 2019-08-26 | Disposition: A | Discharge status: Home / Self Care | Payer: Medicare Other | Attending: Orthopedic Surgery | Admitting: Orthopedic Surgery

## 2019-08-25 ENCOUNTER — Other Ambulatory Visit: Payer: Self-pay

## 2019-08-25 ENCOUNTER — Encounter (HOSPITAL_COMMUNITY)
Admission: RE | Disposition: A | Discharge status: Home / Self Care | Payer: Self-pay | Source: Home / Self Care | Attending: Orthopedic Surgery

## 2019-08-25 ENCOUNTER — Ambulatory Visit (HOSPITAL_COMMUNITY): Payer: Medicare Other | Admitting: Anesthesiology

## 2019-08-25 ENCOUNTER — Encounter (HOSPITAL_COMMUNITY): Payer: Self-pay | Admitting: *Deleted

## 2019-08-25 DIAGNOSIS — Z96651 Presence of right artificial knee joint: Secondary | ICD-10-CM | POA: Diagnosis not present

## 2019-08-25 DIAGNOSIS — E785 Hyperlipidemia, unspecified: Secondary | ICD-10-CM | POA: Insufficient documentation

## 2019-08-25 DIAGNOSIS — Z87891 Personal history of nicotine dependence: Secondary | ICD-10-CM | POA: Insufficient documentation

## 2019-08-25 DIAGNOSIS — M1712 Unilateral primary osteoarthritis, left knee: Secondary | ICD-10-CM | POA: Diagnosis not present

## 2019-08-25 DIAGNOSIS — M170 Bilateral primary osteoarthritis of knee: Secondary | ICD-10-CM | POA: Insufficient documentation

## 2019-08-25 DIAGNOSIS — Z79899 Other long term (current) drug therapy: Secondary | ICD-10-CM | POA: Diagnosis not present

## 2019-08-25 DIAGNOSIS — G4700 Insomnia, unspecified: Secondary | ICD-10-CM | POA: Insufficient documentation

## 2019-08-25 DIAGNOSIS — M179 Osteoarthritis of knee, unspecified: Secondary | ICD-10-CM

## 2019-08-25 DIAGNOSIS — I739 Peripheral vascular disease, unspecified: Secondary | ICD-10-CM | POA: Insufficient documentation

## 2019-08-25 DIAGNOSIS — Z96652 Presence of left artificial knee joint: Secondary | ICD-10-CM

## 2019-08-25 DIAGNOSIS — G8918 Other acute postprocedural pain: Secondary | ICD-10-CM | POA: Diagnosis not present

## 2019-08-25 DIAGNOSIS — J309 Allergic rhinitis, unspecified: Secondary | ICD-10-CM | POA: Insufficient documentation

## 2019-08-25 DIAGNOSIS — M1710 Unilateral primary osteoarthritis, unspecified knee: Secondary | ICD-10-CM

## 2019-08-25 DIAGNOSIS — Z96659 Presence of unspecified artificial knee joint: Secondary | ICD-10-CM

## 2019-08-25 DIAGNOSIS — I10 Essential (primary) hypertension: Secondary | ICD-10-CM | POA: Diagnosis not present

## 2019-08-25 HISTORY — PX: TOTAL KNEE ARTHROPLASTY: SHX125

## 2019-08-25 LAB — TYPE AND SCREEN
ABO/RH(D): O POS
Antibody Screen: NEGATIVE

## 2019-08-25 SURGERY — ARTHROPLASTY, KNEE, TOTAL
Anesthesia: Spinal | Site: Knee | Laterality: Left

## 2019-08-25 MED ORDER — SODIUM CHLORIDE 0.9 % IV BOLUS
500.0000 mL | Freq: Once | INTRAVENOUS | Status: AC
  Administered 2019-08-25: 10:00:00 500 mL via INTRAVENOUS

## 2019-08-25 MED ORDER — GABAPENTIN 300 MG PO CAPS
300.0000 mg | ORAL_CAPSULE | Freq: Three times a day (TID) | ORAL | Status: DC
  Administered 2019-08-25: 300 mg via ORAL
  Filled 2019-08-25: qty 1

## 2019-08-25 MED ORDER — MORPHINE SULFATE (PF) 4 MG/ML IV SOLN: 1.0000 mg | INTRAVENOUS | Status: DC | PRN

## 2019-08-25 MED ORDER — TRAMADOL HCL 50 MG PO TABS: 50.0000 mg | ORAL_TABLET | Freq: Four times a day (QID) | ORAL | Status: DC | PRN

## 2019-08-25 MED ORDER — OXYCODONE HCL 5 MG PO TABS
5.0000 mg | ORAL_TABLET | ORAL | Status: DC | PRN
  Administered 2019-08-25 – 2019-08-26 (×2): 10 mg via ORAL
  Filled 2019-08-25 (×2): qty 2

## 2019-08-25 MED ORDER — LIDOCAINE 2% (20 MG/ML) 5 ML SYRINGE
INTRAMUSCULAR | Status: AC
  Filled 2019-08-25: qty 5

## 2019-08-25 MED ORDER — ACETAMINOPHEN 10 MG/ML IV SOLN
1000.0000 mg | Freq: Four times a day (QID) | INTRAVENOUS | Status: DC
  Administered 2019-08-25: 1000 mg via INTRAVENOUS
  Filled 2019-08-25: qty 100

## 2019-08-25 MED ORDER — PROPOFOL 10 MG/ML IV BOLUS
INTRAVENOUS | Status: DC | PRN
  Administered 2019-08-25: 10 mg via INTRAVENOUS
  Administered 2019-08-25: 20 mg via INTRAVENOUS

## 2019-08-25 MED ORDER — EPHEDRINE 5 MG/ML INJ
INTRAVENOUS | Status: AC
  Filled 2019-08-25: qty 10

## 2019-08-25 MED ORDER — ACETAMINOPHEN 500 MG PO TABS
1000.0000 mg | ORAL_TABLET | Freq: Four times a day (QID) | ORAL | Status: DC
  Administered 2019-08-25 – 2019-08-26 (×3): 1000 mg via ORAL
  Filled 2019-08-25 (×4): qty 2

## 2019-08-25 MED ORDER — OXYCODONE HCL 5 MG/5ML PO SOLN: 5.0000 mg | Freq: Once | ORAL | Status: DC | PRN

## 2019-08-25 MED ORDER — ONDANSETRON HCL 4 MG PO TABS: 4.0000 mg | ORAL_TABLET | Freq: Four times a day (QID) | ORAL | Status: DC | PRN

## 2019-08-25 MED ORDER — SODIUM CHLORIDE (PF) 0.9 % IJ SOLN
INTRAMUSCULAR | Status: AC
  Filled 2019-08-25: qty 50

## 2019-08-25 MED ORDER — METHOCARBAMOL 500 MG PO TABS: 500.0000 mg | ORAL_TABLET | Freq: Four times a day (QID) | ORAL | 0 refills | Status: DC | PRN

## 2019-08-25 MED ORDER — DEXAMETHASONE SODIUM PHOSPHATE 10 MG/ML IJ SOLN
INTRAMUSCULAR | Status: AC
  Filled 2019-08-25: qty 1

## 2019-08-25 MED ORDER — EPHEDRINE SULFATE-NACL 50-0.9 MG/10ML-% IV SOSY
PREFILLED_SYRINGE | INTRAVENOUS | Status: DC | PRN
  Administered 2019-08-25 (×3): 5 mg via INTRAVENOUS

## 2019-08-25 MED ORDER — ASPIRIN EC 325 MG PO TBEC: 325.0000 mg | DELAYED_RELEASE_TABLET | Freq: Two times a day (BID) | ORAL | 0 refills | Status: AC

## 2019-08-25 MED ORDER — DOCUSATE SODIUM 100 MG PO CAPS
100.0000 mg | ORAL_CAPSULE | Freq: Two times a day (BID) | ORAL | Status: DC
  Administered 2019-08-25: 100 mg via ORAL
  Filled 2019-08-25: qty 1

## 2019-08-25 MED ORDER — POVIDONE-IODINE 10 % EX SWAB
2.0000 "application " | Freq: Once | CUTANEOUS | Status: AC
  Administered 2019-08-25: 2 via TOPICAL

## 2019-08-25 MED ORDER — MENTHOL 3 MG MT LOZG: 1.0000 | LOZENGE | OROMUCOSAL | Status: DC | PRN

## 2019-08-25 MED ORDER — CHLORHEXIDINE GLUCONATE 4 % EX LIQD: 60.0000 mL | Freq: Once | CUTANEOUS | Status: DC

## 2019-08-25 MED ORDER — PROPOFOL 10 MG/ML IV BOLUS
INTRAVENOUS | Status: AC
  Filled 2019-08-25: qty 20

## 2019-08-25 MED ORDER — OXYCODONE HCL 5 MG PO TABS: 5.0000 mg | ORAL_TABLET | Freq: Four times a day (QID) | ORAL | 0 refills | Status: DC | PRN

## 2019-08-25 MED ORDER — BUPIVACAINE-EPINEPHRINE (PF) 0.5% -1:200000 IJ SOLN
INTRAMUSCULAR | Status: DC | PRN
  Administered 2019-08-25: 15 mL via PERINEURAL

## 2019-08-25 MED ORDER — LORATADINE 10 MG PO TABS: 10.0000 mg | ORAL_TABLET | Freq: Every day | ORAL | Status: DC

## 2019-08-25 MED ORDER — FENTANYL CITRATE (PF) 100 MCG/2ML IJ SOLN
INTRAMUSCULAR | Status: AC
  Filled 2019-08-25: qty 2

## 2019-08-25 MED ORDER — OXYCODONE HCL 5 MG PO TABS: 5.0000 mg | ORAL_TABLET | Freq: Once | ORAL | Status: DC | PRN

## 2019-08-25 MED ORDER — SODIUM CHLORIDE 0.9 % IV SOLN
INTRAVENOUS | Status: DC
  Administered 2019-08-25: 21:00:00 via INTRAVENOUS

## 2019-08-25 MED ORDER — TRAMADOL HCL 50 MG PO TABS: 50.0000 mg | ORAL_TABLET | Freq: Four times a day (QID) | ORAL | 0 refills | Status: AC | PRN

## 2019-08-25 MED ORDER — PHENOL 1.4 % MT LIQD: 1.0000 | OROMUCOSAL | Status: DC | PRN

## 2019-08-25 MED ORDER — POLYETHYLENE GLYCOL 3350 17 G PO PACK: 17.0000 g | PACK | Freq: Every day | ORAL | Status: DC | PRN

## 2019-08-25 MED ORDER — DIPHENHYDRAMINE HCL 12.5 MG/5ML PO ELIX: 12.5000 mg | ORAL_SOLUTION | ORAL | Status: DC | PRN

## 2019-08-25 MED ORDER — ONDANSETRON HCL 4 MG/2ML IJ SOLN
INTRAMUSCULAR | Status: DC | PRN
  Administered 2019-08-25: 4 mg via INTRAVENOUS

## 2019-08-25 MED ORDER — LACTATED RINGERS IV SOLN
INTRAVENOUS | Status: DC
  Administered 2019-08-25: 06:00:00 via INTRAVENOUS

## 2019-08-25 MED ORDER — SODIUM CHLORIDE (PF) 0.9 % IJ SOLN
INTRAMUSCULAR | Status: DC | PRN
  Administered 2019-08-25: 60 mL

## 2019-08-25 MED ORDER — BUPIVACAINE LIPOSOME 1.3 % IJ SUSP
INTRAMUSCULAR | Status: DC | PRN
  Administered 2019-08-25: 20 mL

## 2019-08-25 MED ORDER — SODIUM CHLORIDE (PF) 0.9 % IJ SOLN
INTRAMUSCULAR | Status: AC
  Filled 2019-08-25: qty 10

## 2019-08-25 MED ORDER — DEXAMETHASONE SODIUM PHOSPHATE 10 MG/ML IJ SOLN
8.0000 mg | Freq: Once | INTRAMUSCULAR | Status: AC
  Administered 2019-08-25: 8 mg via INTRAVENOUS

## 2019-08-25 MED ORDER — DEXAMETHASONE SODIUM PHOSPHATE 10 MG/ML IJ SOLN: 10.0000 mg | Freq: Once | INTRAMUSCULAR | Status: DC

## 2019-08-25 MED ORDER — METOCLOPRAMIDE HCL 5 MG PO TABS: 5.0000 mg | ORAL_TABLET | Freq: Three times a day (TID) | ORAL | Status: DC | PRN

## 2019-08-25 MED ORDER — ONDANSETRON HCL 4 MG/2ML IJ SOLN
INTRAMUSCULAR | Status: AC
  Filled 2019-08-25: qty 2

## 2019-08-25 MED ORDER — GABAPENTIN 300 MG PO CAPS: 300.0000 mg | ORAL_CAPSULE | ORAL | 0 refills | Status: DC

## 2019-08-25 MED ORDER — FENTANYL CITRATE (PF) 100 MCG/2ML IJ SOLN: 50.0000 ug | INTRAMUSCULAR | Status: DC

## 2019-08-25 MED ORDER — ONDANSETRON HCL 4 MG/2ML IJ SOLN: 4.0000 mg | Freq: Four times a day (QID) | INTRAMUSCULAR | Status: DC | PRN

## 2019-08-25 MED ORDER — METHOCARBAMOL 500 MG IVPB - SIMPLE MED
500.0000 mg | Freq: Four times a day (QID) | INTRAVENOUS | Status: DC | PRN
  Filled 2019-08-25: qty 50

## 2019-08-25 MED ORDER — PROMETHAZINE HCL 25 MG/ML IJ SOLN: 6.2500 mg | INTRAMUSCULAR | Status: DC | PRN

## 2019-08-25 MED ORDER — PHENYLEPHRINE HCL (PRESSORS) 10 MG/ML IV SOLN
INTRAVENOUS | Status: AC
  Filled 2019-08-25: qty 1

## 2019-08-25 MED ORDER — FLEET ENEMA 7-19 GM/118ML RE ENEM: 1.0000 | ENEMA | Freq: Once | RECTAL | Status: DC | PRN

## 2019-08-25 MED ORDER — ASPIRIN EC 325 MG PO TBEC
325.0000 mg | DELAYED_RELEASE_TABLET | Freq: Two times a day (BID) | ORAL | Status: DC
  Administered 2019-08-26: 325 mg via ORAL
  Filled 2019-08-25: qty 1

## 2019-08-25 MED ORDER — PROPOFOL 500 MG/50ML IV EMUL
INTRAVENOUS | Status: DC | PRN
  Administered 2019-08-25: 100 ug/kg/min via INTRAVENOUS

## 2019-08-25 MED ORDER — BISACODYL 10 MG RE SUPP: 10.0000 mg | Freq: Every day | RECTAL | Status: DC | PRN

## 2019-08-25 MED ORDER — SODIUM CHLORIDE 0.9 % IR SOLN
Status: DC | PRN
  Administered 2019-08-25 (×2): 1000 mL

## 2019-08-25 MED ORDER — CEFAZOLIN SODIUM-DEXTROSE 2-4 GM/100ML-% IV SOLN
2.0000 g | Freq: Four times a day (QID) | INTRAVENOUS | Status: AC
  Administered 2019-08-25 – 2019-08-26 (×2): 2 g via INTRAVENOUS
  Filled 2019-08-25 (×2): qty 100

## 2019-08-25 MED ORDER — METOCLOPRAMIDE HCL 5 MG/ML IJ SOLN: 5.0000 mg | Freq: Three times a day (TID) | INTRAMUSCULAR | Status: DC | PRN

## 2019-08-25 MED ORDER — ACETAMINOPHEN 500 MG PO TABS: 1000.0000 mg | ORAL_TABLET | Freq: Once | ORAL | Status: DC

## 2019-08-25 MED ORDER — SODIUM CHLORIDE 0.9 % IV BOLUS
250.0000 mL | Freq: Once | INTRAVENOUS | Status: AC
  Administered 2019-08-25: 250 mL via INTRAVENOUS

## 2019-08-25 MED ORDER — METHOCARBAMOL 500 MG PO TABS
500.0000 mg | ORAL_TABLET | Freq: Four times a day (QID) | ORAL | Status: DC | PRN
  Administered 2019-08-25: 500 mg via ORAL
  Filled 2019-08-25: qty 1

## 2019-08-25 MED ORDER — SODIUM CHLORIDE 0.9 % IV BOLUS: 250.0000 mL | Freq: Once | INTRAVENOUS | Status: DC

## 2019-08-25 MED ORDER — FENTANYL CITRATE (PF) 100 MCG/2ML IJ SOLN
INTRAMUSCULAR | Status: DC | PRN
  Administered 2019-08-25: 50 ug via INTRAVENOUS

## 2019-08-25 MED ORDER — PROPOFOL 500 MG/50ML IV EMUL
INTRAVENOUS | Status: AC
  Filled 2019-08-25: qty 50

## 2019-08-25 MED ORDER — FENTANYL CITRATE (PF) 100 MCG/2ML IJ SOLN
INTRAMUSCULAR | Status: AC
  Administered 2019-08-25: 50 ug
  Filled 2019-08-25: qty 2

## 2019-08-25 MED ORDER — FENTANYL CITRATE (PF) 100 MCG/2ML IJ SOLN: 25.0000 ug | INTRAMUSCULAR | Status: DC | PRN

## 2019-08-25 MED ORDER — BUPIVACAINE IN DEXTROSE 0.75-8.25 % IT SOLN
INTRATHECAL | Status: DC | PRN
  Administered 2019-08-25: 1.7 mL via INTRATHECAL

## 2019-08-25 MED ORDER — TRANEXAMIC ACID-NACL 1000-0.7 MG/100ML-% IV SOLN
1000.0000 mg | INTRAVENOUS | Status: AC
  Administered 2019-08-25: 09:00:00 1000 mg via INTRAVENOUS
  Filled 2019-08-25: qty 100

## 2019-08-25 MED ORDER — CEFAZOLIN SODIUM-DEXTROSE 2-4 GM/100ML-% IV SOLN
2.0000 g | INTRAVENOUS | Status: AC
  Administered 2019-08-25: 2 g via INTRAVENOUS
  Filled 2019-08-25: qty 100

## 2019-08-25 SURGICAL SUPPLY — 61 items
ATTUNE MED DOME PAT 41 KNEE (Knees) ×2 IMPLANT
ATTUNE PS FEM LT SZ 7 CEM KNEE (Femur) ×2 IMPLANT
ATTUNE PSRP INSR SZ7 7 KNEE (Insert) ×2 IMPLANT
BAG ZIPLOCK 12X15 (MISCELLANEOUS) ×2 IMPLANT
BASE TIBIAL ROT PLAT SZ 7 KNEE (Knees) ×1 IMPLANT
BLADE SAG 18X100X1.27 (BLADE) ×2 IMPLANT
BLADE SAW SGTL 11.0X1.19X90.0M (BLADE) ×2 IMPLANT
BLADE SURG SZ10 CARB STEEL (BLADE) ×4 IMPLANT
BNDG ELASTIC 6X5.8 VLCR STR LF (GAUZE/BANDAGES/DRESSINGS) ×2 IMPLANT
BOWL SMART MIX CTS (DISPOSABLE) ×2 IMPLANT
CEMENT HV SMART SET (Cement) ×4 IMPLANT
COVER SURGICAL LIGHT HANDLE (MISCELLANEOUS) ×2 IMPLANT
COVER WAND RF STERILE (DRAPES) IMPLANT
CUFF TOURN SGL QUICK 34 (TOURNIQUET CUFF) ×1
CUFF TRNQT CYL 34X4.125X (TOURNIQUET CUFF) ×1 IMPLANT
DECANTER SPIKE VIAL GLASS SM (MISCELLANEOUS) ×2 IMPLANT
DRAPE U-SHAPE 47X51 STRL (DRAPES) ×2 IMPLANT
DRESSING AQUACEL AG SP 3.5X10 (GAUZE/BANDAGES/DRESSINGS) ×1 IMPLANT
DRSG ADAPTIC 3X8 NADH LF (GAUZE/BANDAGES/DRESSINGS) ×2 IMPLANT
DRSG AQUACEL AG SP 3.5X10 (GAUZE/BANDAGES/DRESSINGS) ×2
DRSG PAD ABDOMINAL 8X10 ST (GAUZE/BANDAGES/DRESSINGS) ×2 IMPLANT
DURAPREP 26ML APPLICATOR (WOUND CARE) ×2 IMPLANT
ELECT REM PT RETURN 15FT ADLT (MISCELLANEOUS) ×2 IMPLANT
EVACUATOR 1/8 PVC DRAIN (DRAIN) ×2 IMPLANT
GAUZE SPONGE 4X4 12PLY STRL (GAUZE/BANDAGES/DRESSINGS) ×2 IMPLANT
GLOVE BIO SURGEON STRL SZ7 (GLOVE) ×2 IMPLANT
GLOVE BIO SURGEON STRL SZ8 (GLOVE) ×2 IMPLANT
GLOVE BIOGEL PI IND STRL 6.5 (GLOVE) ×1 IMPLANT
GLOVE BIOGEL PI IND STRL 7.0 (GLOVE) ×1 IMPLANT
GLOVE BIOGEL PI IND STRL 8 (GLOVE) ×1 IMPLANT
GLOVE BIOGEL PI INDICATOR 6.5 (GLOVE) ×1
GLOVE BIOGEL PI INDICATOR 7.0 (GLOVE) ×1
GLOVE BIOGEL PI INDICATOR 8 (GLOVE) ×1
GLOVE SURG SS PI 6.5 STRL IVOR (GLOVE) ×2 IMPLANT
GOWN STRL REUS W/TWL LRG LVL3 (GOWN DISPOSABLE) ×6 IMPLANT
HANDPIECE INTERPULSE COAX TIP (DISPOSABLE) ×1
HOLDER FOLEY CATH W/STRAP (MISCELLANEOUS) ×2 IMPLANT
IMMOBILIZER KNEE 20 (SOFTGOODS) ×2
IMMOBILIZER KNEE 20 THIGH 36 (SOFTGOODS) ×1 IMPLANT
KIT TURNOVER KIT A (KITS) IMPLANT
MANIFOLD NEPTUNE II (INSTRUMENTS) ×2 IMPLANT
NS IRRIG 1000ML POUR BTL (IV SOLUTION) ×2 IMPLANT
PACK TOTAL KNEE CUSTOM (KITS) ×2 IMPLANT
PADDING CAST COTTON 6X4 STRL (CAST SUPPLIES) ×4 IMPLANT
PENCIL SMOKE EVACUATOR (MISCELLANEOUS) ×2 IMPLANT
PIN DRILL FIX HALF THREAD (BIT) ×2 IMPLANT
PIN STEINMAN FIXATION KNEE (PIN) ×2 IMPLANT
PROTECTOR NERVE ULNAR (MISCELLANEOUS) ×2 IMPLANT
SET HNDPC FAN SPRY TIP SCT (DISPOSABLE) ×1 IMPLANT
SLEEVE SUCTION CATH 165 (SLEEVE) ×2 IMPLANT
STRIP CLOSURE SKIN 1/2X4 (GAUZE/BANDAGES/DRESSINGS) ×4 IMPLANT
SUT MNCRL AB 4-0 PS2 18 (SUTURE) ×2 IMPLANT
SUT STRATAFIX 0 PDS 27 VIOLET (SUTURE) ×2
SUT VIC AB 2-0 CT1 27 (SUTURE) ×3
SUT VIC AB 2-0 CT1 TAPERPNT 27 (SUTURE) ×3 IMPLANT
SUTURE STRATFX 0 PDS 27 VIOLET (SUTURE) ×1 IMPLANT
TIBIAL BASE ROT PLAT SZ 7 KNEE (Knees) ×2 IMPLANT
TRAY FOLEY MTR SLVR 16FR STAT (SET/KITS/TRAYS/PACK) ×2 IMPLANT
WATER STERILE IRR 1000ML POUR (IV SOLUTION) ×4 IMPLANT
WRAP KNEE MAXI GEL POST OP (GAUZE/BANDAGES/DRESSINGS) ×2 IMPLANT
YANKAUER SUCT BULB TIP 10FT TU (MISCELLANEOUS) ×2 IMPLANT

## 2019-08-25 NOTE — Anesthesia Procedure Notes (Addendum)
Anesthesia Regional Block: Adductor canal block   Pre-Anesthetic Checklist: ,, timeout performed, Correct Patient, Correct Site, Correct Laterality, Correct Procedure, Correct Position, site marked, Risks and benefits discussed, pre-op evaluation,  At surgeon's request and post-op pain management  Laterality: Left  Prep: Maximum Sterile Barrier Precautions used, chloraprep       Needles:  Injection technique: Single-shot  Needle Type: Echogenic Stimulator Needle     Needle Length: 9cm  Needle Gauge: 22     Additional Needles:   Procedures:,,,, ultrasound used (permanent image in chart),,,,  Narrative:  Start time: 08/25/2019 8:05 AM End time: 08/25/2019 8:07 AM Injection made incrementally with aspirations every 5 mL.  Performed by: Personally  Anesthesiologist: Brennan Bailey, MD  Additional Notes: Risks, benefits, and alternative discussed. Patient gave consent for procedure. Patient prepped and draped in sterile fashion. Sedation administered, patient remains easily responsive to voice. Relevant anatomy identified with ultrasound guidance. Local anesthetic given in 5cc increments with no signs or symptoms of intravascular injection. No pain or paraesthesias with injection. Patient monitored throughout procedure with signs of LAST or immediate complications. Tolerated well. Ultrasound image placed in chart.  Tawny Asal, MD

## 2019-08-25 NOTE — Progress Notes (Signed)
Assisted Dr. Daiva Huge with left, ultrasound guided, adductor canal block. Side rails up, monitors on throughout procedure. See vital signs in flow sheet. Tolerated Procedure well.

## 2019-08-25 NOTE — Op Note (Signed)
OPERATIVE REPORT-TOTAL KNEE ARTHROPLASTY   Pre-operative diagnosis- Osteoarthritis  Left knee(s)  Post-operative diagnosis- Osteoarthritis Left knee(s)  Procedure-  Left  Total Knee Arthroplasty  Surgeon- Dione Plover. Tamari Busic, MD  Assistant- Ardeen Jourdain, PA-C   Anesthesia-  Adductor canal block and spinal  EBL-50 mL   Drains Hemovac  Tourniquet time-  Total Tourniquet Time Documented: Thigh (Left) - 35 minutes Total: Thigh (Left) - 35 minutes     Complications- None  Condition-PACU - hemodynamically stable.   Brief Clinical Note   Joshua [name] is a 73 y.o. year old male with end stage OA of his left knee with progressively worsening pain and dysfunction. He has constant pain, with activity and at rest and significant functional deficits with difficulties even with ADLs. He has had extensive non-op management including analgesics, injections of cortisone and viscosupplements, and home exercise program, but remains in significant pain with significant dysfunction. Radiographs show bone on bone arthritis medial and patellofemoral. He presents now for left Total Knee Arthroplasty.    Procedure in detail---   The patient is brought into the operating room and positioned supine on the operating table. After successful administration of  Adductor canal block and spinal,   a tourniquet is placed high on the  Left thigh(s) and the lower extremity is prepped and draped in the usual sterile fashion. Time out is performed by the operating team and then the  Left lower extremity is wrapped in Esmarch, knee flexed and the tourniquet inflated to 300 mmHg.       A midline incision is made with a ten blade through the subcutaneous tissue to the level of the extensor mechanism. A fresh blade is used to make a medial parapatellar arthrotomy. Soft tissue over the proximal medial tibia is subperiosteally elevated to the joint line with a knife and into the semimembranosus bursa with a Cobb  elevator. Soft tissue over the proximal lateral tibia is elevated with attention being paid to avoiding the patellar tendon on the tibial tubercle. The patella is everted, knee flexed 90 degrees and the ACL and PCL are removed. Findings are bone on bone medial and patellofemoral with large global osteophytes        The drill is used to create a starting hole in the distal femur and the canal is thoroughly irrigated with sterile saline to remove the fatty contents. The 5 degree Left  valgus alignment guide is placed into the femoral canal and the distal femoral cutting block is pinned to remove 9 mm off the distal femur. Resection is made with an oscillating saw.      The tibia is subluxed forward and the menisci are removed. The extramedullary alignment guide is placed referencing proximally at the medial aspect of the tibial tubercle and distally along the second metatarsal axis and tibial crest. The block is pinned to remove 57mm off the more deficient medial  side. Resection is made with an oscillating saw. Size 7is the most appropriate size for the tibia and the proximal tibia is prepared with the modular drill and keel punch for that size.      The femoral sizing guide is placed and size 7 is most appropriate. Rotation is marked off the epicondylar axis and confirmed by creating a rectangular flexion gap at 90 degrees. The size 7 cutting block is pinned in this rotation and the anterior, posterior and chamfer cuts are made with the oscillating saw. The intercondylar block is then placed and that cut is made.  Trial size 7 tibial component, trial size 7 posterior stabilized femur and a 7  mm posterior stabilized rotating platform insert trial is placed. Full extension is achieved with excellent varus/valgus and anterior/posterior balance throughout full range of motion. The patella is everted and thickness measured to be 27  mm. Free hand resection is taken to 15 mm, a 41 template is placed, lug holes are  drilled, trial patella is placed, and it tracks normally. Osteophytes are removed off the posterior femur with the trial in place. All trials are removed and the cut bone surfaces prepared with pulsatile lavage. Cement is mixed and once ready for implantation, the size 7 tibial implant, size  7 posterior stabilized femoral component, and the size 41 patella are cemented in place and the patella is held with the clamp. The trial insert is placed and the knee held in full extension. The Exparel (20 ml mixed with 60 ml saline) is injected into the extensor mechanism, posterior capsule, medial and lateral gutters and subcutaneous tissues.  All extruded cement is removed and once the cement is hard the permanent 7 mm posterior stabilized rotating platform insert is placed into the tibial tray.      The wound is copiously irrigated with saline solution and the extensor mechanism closed over a hemovac drain with #1 V-loc suture. The tourniquet is released for a total tourniquet time of 35  minutes. Flexion against gravity is 140 degrees and the patella tracks normally. Subcutaneous tissue is closed with 2.0 vicryl and subcuticular with running 4.0 Monocryl. The incision is cleaned and dried and steri-strips and a bulky sterile dressing are applied. The limb is placed into a knee immobilizer and the patient is awakened and transported to recovery in stable condition.      Please note that a surgical assistant was a medical necessity for this procedure in order to perform it in a safe and expeditious manner. Surgical assistant was necessary to retract the ligaments and vital neurovascular structures to prevent injury to them and also necessary for proper positioning of the limb to allow for anatomic placement of the prosthesis.   Dione Plover Karen Kinnard, MD    08/25/2019, 9:22 AM

## 2019-08-25 NOTE — Progress Notes (Signed)
PACU  Phase II   Pt unable to void, Bladder scan showinh 990 ml, DR. Alusio made aware, verbal order for straight cath and keep patient for 2 hours  until he voids post straight cath , to admit patient if unable to void .

## 2019-08-25 NOTE — Anesthesia Procedure Notes (Signed)
Procedure Name: MAC Date/Time: 08/25/2019 8:12 AM Performed by: Eben Burow, CRNA Pre-anesthesia Checklist: Patient identified, Emergency Drugs available, Suction available, Patient being monitored and Timeout performed Oxygen Delivery Method: Simple face mask Dental Injury: Teeth and Oropharynx as per pre-operative assessment

## 2019-08-25 NOTE — Care Plan (Signed)
Ortho Bundle Case Management Note  Patient Details  Name: Joshua [name] MRN: [id number] Date of Birth: 1946/06/12  L TKA on 08-25-19 DCP:  Home with spouse.  1 story home with 3 ste. DME:  No needs. Has a RW, doesn't want/need a 3-in-1. PT:  [facility].  PT eval scheduled for 08-28-19.                   DME Arranged:  N/A DME Agency:  NA  HH Arranged:  NA HH Agency:  NA  Additional Comments: Please contact me with any questions of if this plan should need to change.  Marianne Sofia, RN,CCM [facility]  [phone] 08/25/2019, 3:42 PM

## 2019-08-25 NOTE — Addendum Note (Signed)
Addendum  created 08/25/19 1040 by Eben Burow, CRNA   Charge Capture section accepted, Visit diagnoses modified

## 2019-08-25 NOTE — Anesthesia Postprocedure Evaluation (Signed)
Anesthesia Post Note  Patient: Joshua [name]  Procedure(s) Performed: TOTAL KNEE ARTHROPLASTY (Left Knee)     Patient location during evaluation: PACU Anesthesia Type: Spinal Level of consciousness: awake and alert and oriented Pain management: pain level controlled Vital Signs Assessment: post-procedure vital signs reviewed and stable Respiratory status: spontaneous breathing, nonlabored ventilation and respiratory function stable Cardiovascular status: blood pressure returned to baseline Postop Assessment: no apparent nausea or vomiting, spinal receding, no headache and no backache Anesthetic complications: no    Last Vitals:  Vitals:   08/25/19 0945 08/25/19 1000  BP: 139/76 (!) 156/76  Pulse: 84 71  Resp: 14 15  Temp:    SpO2: 100% 100%    Last Pain:  Vitals:   08/25/19 0940  TempSrc:   PainSc: 0-No pain                 Brennan Bailey

## 2019-08-25 NOTE — Anesthesia Procedure Notes (Addendum)
Spinal  Patient location during procedure: OR Start time: 08/25/2019 8:23 AM Staffing Anesthesiologist: Brennan Bailey, MD Resident/CRNA: Eben Burow, CRNA Performed: resident/CRNA  Preanesthetic Checklist Completed: patient identified, surgical consent, pre-op evaluation, timeout performed, IV checked, risks and benefits discussed and monitors and equipment checked Spinal Block Patient position: sitting Prep: site prepped and draped and DuraPrep Patient monitoring: continuous pulse ox and blood pressure Approach: midline Location: L3-4 Injection technique: single-shot Needle Needle type: Pencan  Needle gauge: 24 G Needle length: 10 cm Additional Notes Pt placed in sitting position, spinal kit expiration date checked and verified, + CSF, - heme, pt tolerated well. Dr Daiva Huge present and supervising throughout [employer].

## 2019-08-25 NOTE — Interval H&P Note (Signed)
History and Physical Interval Note:  08/25/2019 6:54 AM  Joshua [name]  has presented today for surgery, with the diagnosis of left knee osteoarthritis.  The various methods of treatment have been discussed with the patient and family. After consideration of risks, benefits and other options for treatment, the patient has consented to  Procedure(s) with comments: TOTAL KNEE ARTHROPLASTY (Left) - 1min as a surgical intervention.  The patient's history has been reviewed, patient examined, no change in status, stable for surgery.  I have reviewed the patient's chart and labs.  Questions were answered to the patient's satisfaction.     Pilar Plate Chelise Hanger

## 2019-08-25 NOTE — Evaluation (Signed)
Physical Therapy Evaluation Patient Details Name: Joshua [name] MRN: [id number] DOB: December 04, 1945 Today's Date: 08/25/2019   History of Present Illness  Patient is 73 y/o male s/p Lt TKA on 08/25/19 with PMH significant for PTSD, HTN, HLD, GERD, PA, Rt TKA in 2015, and ACDF C3-T1.    Clinical Impression  Joshua [name] is a 73 y.o. male POD 0 s/p Lt TKA. Patient reports independence with mobility at baseline. Patient is now limited by functional impairments (see PT problem list below) and requires min guard for transfers and gait with RW. Patient was able to ambulate ~60 feet with RW and min guard and cues for safe walker management. Patient educated on safe sequencing for stair mobility and verbalized safe guarding position for people assisting with mobility. Patient instructed in exercises to facilitate ROM and circulation. Patient will benefit from continued skilled PT interventions to address impairments and progress towards PLOF. Patient has met mobility goals at adequate level for discharge home; will continue to follow if pt continues acute stay to progress towards Mod I goals.    Follow Up Recommendations Follow surgeon's recommendation for DC plan and follow-up therapies    Equipment Recommendations  None recommended by PT    Recommendations for Other Services       Precautions / Restrictions Precautions Precautions: Fall Restrictions Weight Bearing Restrictions: No      Mobility  Bed Mobility Overal bed mobility: Needs Assistance Bed Mobility: Supine to Sit;Sit to Supine     Supine to sit: Supervision;HOB elevated Sit to supine: Supervision;HOB elevated   General bed mobility comments: HOB slightly elevated, no use of bed rails, no assistance or cues required  Transfers Overall transfer level: Needs assistance Equipment used: Rolling walker (2 wheeled) Transfers: Sit to/from Stand Sit to Stand: Min guard         General transfer comment: cues for hand placement  and technique with RW, no physical assist required for power up, guarding provided for rise  Ambulation/Gait Ambulation/Gait assistance: Min assist Gait Distance (Feet): 60 Feet Assistive device: Rolling walker (2 wheeled) Gait Pattern/deviations: Step-through pattern;Decreased stride length Gait velocity: decreased   General Gait Details: cues for safe hand placement and to maintain safe proximity to RW throughout gait, no overt LOB noted by pt, intermittent reminders required for safety to keep hands on walker  Stairs Stairs: Yes Stairs assistance: Min guard Stair Management: One rail Right;One rail Left;Forwards;Step to pattern(Rt going up, Lt going down) Number of Stairs: 5 General stair comments: cues for safe step to pattern and for sequencing "up with good, down with bad", verbal cues for safe guarding position for those assisting patient, he verbalized via teach back technique  Wheelchair Mobility    Modified Rankin (Stroke Patients Only)       Balance Overall balance assessment: Needs assistance Sitting-balance support: Feet supported Sitting balance-Leahy Scale: Good     Standing balance support: During functional activity;Bilateral upper extremity supported Standing balance-Leahy Scale: Fair            Pertinent Vitals/Pain Pain Assessment: 0-10 Pain Score: 2  Pain Location: Lt knee Pain Descriptors / Indicators: Aching Pain Intervention(s): Limited activity within patient's tolerance;Monitored during session    Home Living Family/patient expects to be discharged to:: Private residence Living Arrangements: Spouse/significant other Available Help at Discharge: Family;Available 24 hours/day Type of Home: House Home Access: Stairs to enter Entrance Stairs-Rails: Right;None Entrance Stairs-Number of Steps: 4 at front no rails, or 9 from garage/basement, Rt hand rail and shelf  on Lt Home Layout: One level Home Equipment: Shower seat - built in;Grab bars -  tub/shower;Walker - 2 wheels;Cane - single point      Prior Function Level of Independence: Independent         Comments: patient enjoys walking for exercise, walks ~2 miles a day with wife     Hand Dominance   Dominant Hand: Right    Extremity/Trunk Assessment   Upper Extremity Assessment Upper Extremity Assessment: Overall WFL for tasks assessed    Lower Extremity Assessment Lower Extremity Assessment: Overall WFL for tasks assessed;LLE deficits/detail LLE Deficits / Details: pt with good quad activation, no extensor lag with SLR, 4/5 for quad strength with MMT LLE Sensation: WNL LLE Coordination: WNL    Cervical / Trunk Assessment Cervical / Trunk Assessment: Normal  Communication   Communication: No difficulties  Cognition Arousal/Alertness: Awake/alert Behavior During Therapy: WFL for tasks assessed/performed Overall Cognitive Status: Within Functional Limits for tasks assessed             General Comments      Exercises Total Joint Exercises Ankle Circles/Pumps: AROM;5 reps;Supine;Both Quad Sets: AROM;Left;5 reps;Supine Short Arc Quad: AROM;5 reps;Supine;Left Heel Slides: AROM;5 reps;Supine;Left Hip ABduction/ADduction: AROM;5 reps;Supine;Left Straight Leg Raises: AROM;5 reps;Supine;Left Long Arc Quad: AROM;Seated;5 reps;Left Knee Flexion: AROM;AAROM;5 reps;Seated;Left   Assessment/Plan    PT Assessment Patient needs continued PT services  PT Problem List Decreased strength;Decreased balance;Decreased mobility;Decreased range of motion;Decreased activity tolerance;Decreased knowledge of use of DME       PT Treatment Interventions DME instruction;Functional mobility training;Balance training;Patient/family education;Modalities;Gait training;Therapeutic activities;Stair training;Therapeutic exercise    PT Goals (Current goals can be found in the Care Plan section)  Acute Rehab PT Goals Patient Stated Goal: get back to walking for exercise PT Goal  Formulation: With patient Time For Goal Achievement: 09/01/19 Potential to Achieve Goals: Good    Frequency 7X/week    AM-PAC PT "6 Clicks" Mobility  Outcome Measure Help needed turning from your back to your side while in a flat bed without using bedrails?: A Little Help needed moving from lying on your back to sitting on the side of a flat bed without using bedrails?: A Little Help needed moving to and from a bed to a chair (including a wheelchair)?: A Little Help needed standing up from a chair using your arms (e.g., wheelchair or bedside chair)?: A Little Help needed to walk in hospital room?: A Little Help needed climbing 3-5 steps with a railing? : A Little 6 Click Score: 18    End of Session Equipment Utilized During Treatment: Gait belt Activity Tolerance: Patient tolerated treatment well Patient left: in bed;with call bell/phone within reach Nurse Communication: Mobility status PT Visit Diagnosis: Muscle weakness (generalized) (M62.81);Difficulty in walking, not elsewhere classified (R26.2)    Time: 1638-4665 PT Time Calculation (min) (ACUTE ONLY): 47 min   Charges:   PT Evaluation $PT Eval Low Complexity: 1 Low PT Treatments $Gait Training: 8-22 mins $Therapeutic Exercise: 8-22 mins        Kipp Brood, PT, DPT Physical Therapist with [facility]  08/25/2019 7:20 PM

## 2019-08-25 NOTE — Plan of Care (Signed)
  Problem: Skin Integrity: Goal: Will show signs of wound healing Outcome: Progressing   

## 2019-08-25 NOTE — Transfer of Care (Signed)
Immediate Anesthesia Transfer of Care Note  Patient: UCHECHUKWU ADDIE  Procedure(s) Performed: TOTAL KNEE ARTHROPLASTY (Left Knee)  Patient Location: PACU  Anesthesia Type:Spinal  Level of Consciousness: awake, alert  and oriented  Airway & Oxygen Therapy: Patient Spontanous Breathing and Patient connected to face mask oxygen  Post-op Assessment: Report given to RN and Post -op Vital signs reviewed and stable  Post vital signs: Reviewed and stable  Last Vitals:  Vitals Value Taken Time  BP 133/89 08/25/19 0940  Temp    Pulse 92 08/25/19 0942  Resp 16 08/25/19 0942  SpO2 98 % 08/25/19 0942  Vitals shown include unvalidated device data.  Last Pain:  Vitals:   08/25/19 0808  TempSrc:   PainSc: 0-No pain      Patients Stated Pain Goal: 4 ([id number])  Complications: No apparent anesthesia complications

## 2019-08-25 NOTE — Discharge Instructions (Addendum)
Dr. Gaynelle Arabian Total Joint Specialist [facility] [address] [place], [place] [address] [phone]  TOTAL KNEE REPLACEMENT POSTOPERATIVE DIRECTIONS  Knee Rehabilitation, Guidelines Following Surgery  Results after knee surgery are often greatly improved when you follow the exercise, range of motion and muscle strengthening exercises prescribed by your doctor. Safety measures are also important to protect the knee from further injury. Any time any of these exercises cause you to have increased pain or swelling in your knee joint, decrease the amount until you are comfortable again and slowly increase them. If you have problems or questions, call your caregiver or physical therapist for advice.   HOME CARE INSTRUCTIONS   Remove items at home which could result in a fall. This includes throw rugs or furniture in walking pathways.   ICE to the affected knee every three hours for 30 minutes at a time and then as needed for pain and swelling.  Continue to use ice on the knee for pain and swelling from surgery. You may notice swelling that will progress down to the foot and ankle.  This is normal after surgery.  Elevate the leg when you are not up walking on it.    Continue to use the breathing machine which will help keep your temperature down.  It is common for your temperature to cycle up and down following surgery, especially at night when you are not up moving around and exerting yourself.  The breathing machine keeps your lungs expanded and your temperature down.  Do not place pillow under knee, focus on keeping the knee straight while resting  DIET You may resume your previous home diet once your are discharged from the hospital.  DRESSING / WOUND CARE / SHOWERING Keep the surgical dressing until follow up.  The dressing is water proof, so you can shower without any extra covering.  IF THE DRESSING FALLS OFF or the wound gets wet inside, change the dressing with  sterile gauze.  Please use good hand washing techniques before changing the dressing.  Do not use any lotions or creams on the incision until instructed by your surgeon.   You may start showering once you are discharged home but do not submerge the incision under water. Just pat the incision dry and apply a dry gauze dressing on daily. Change the surgical dressing daily and reapply a dry dressing each time.  ACTIVITY Walk with your walker as instructed. Use walker as long as suggested by your caregivers. Avoid periods of inactivity such as sitting longer than an hour when not asleep. This helps prevent blood clots.  You may resume a sexual relationship in one month or when given the OK by your doctor.  You may return to work once you are cleared by your doctor.  Do not drive a car for 6 weeks or until released by you surgeon.  Do not drive while taking narcotics.  WEIGHT BEARING Weight bearing as tolerated with assist device (walker, cane, etc) as directed, use it as long as suggested by your surgeon or therapist, typically at least 4-6 weeks.  POSTOPERATIVE CONSTIPATION PROTOCOL Constipation - defined medically as fewer than three stools per week and severe constipation as less than one stool per week.  One of the most common issues patients have following surgery is constipation.  Even if you have a regular bowel pattern at home, your normal regimen is likely to be disrupted due to multiple reasons following surgery.  Combination of anesthesia, postoperative narcotics,  change in appetite and fluid intake all can affect your bowels.  In order to avoid complications following surgery, here are some recommendations in order to help you during your recovery period.  Colace (docusate) - Pick up an over-the-counter form of Colace or another stool softener and take twice a day as long as you are requiring postoperative pain medications.  Take with a full glass of water daily.  If you experience loose  stools or diarrhea, hold the colace until you stool forms back up.  If your symptoms do not get better within 1 week or if they get worse, check with your doctor.  Dulcolax (bisacodyl) - Pick up over-the-counter and take as directed by the product packaging as needed to assist with the movement of your bowels.  Take with a full glass of water.  Use this product as needed if not relieved by Colace only.   MiraLax (polyethylene glycol) - Pick up over-the-counter to have on hand.  MiraLax is a solution that will increase the amount of water in your bowels to assist with bowel movements.  Take as directed and can mix with a glass of water, juice, soda, coffee, or tea.  Take if you go more than two days without a movement. Do not use MiraLax more than once per day. Call your doctor if you are still constipated or irregular after using this medication for 7 days in a row.  If you continue to have problems with postoperative constipation, please contact the office for further assistance and recommendations.  If you experience "the worst abdominal pain ever" or develop nausea or vomiting, please contact the office immediatly for further recommendations for treatment.  ITCHING If you experience itching with your medications, try taking only a single pain pill, or even half a pain pill at a time.  You can also use Benadryl over the counter for itching or also to help with sleep.   TED HOSE STOCKINGS Wear the elastic stockings on both legs for three weeks following surgery during the day but you may remove then at night for sleeping.  MEDICATIONS See your medication summary on the After Visit Summary that the nursing staff will review with you prior to discharge.  You may have some home medications which will be placed on hold until you complete the course of blood thinner medication.  It is important for you to complete the blood thinner medication as prescribed by your surgeon.  Continue your approved  medications as instructed at time of discharge.  PRECAUTIONS If you experience chest pain or shortness of breath - call 911 immediately for transfer to the hospital emergency department.  If you develop a fever greater that 101 F, purulent drainage from wound, increased redness or drainage from wound, foul odor from the wound/dressing, or calf pain - CONTACT YOUR SURGEON.                                                   FOLLOW-UP APPOINTMENTS Make sure you keep all of your appointments after your operation with your surgeon and caregivers. You should call the office at the above phone number and make an appointment for approximately two weeks after the date of your surgery or on the date instructed by your surgeon outlined in the "After Visit Summary".  RANGE OF MOTION AND STRENGTHENING EXERCISES  Rehabilitation  of the knee is important following a knee injury or an operation. After just a few days of immobilization, the muscles of the thigh which control the knee become weakened and shrink (atrophy). Knee exercises are designed to build up the tone and strength of the thigh muscles and to improve knee motion. Often times heat used for twenty to thirty minutes before working out will loosen up your tissues and help with improving the range of motion but do not use heat for the first two weeks following surgery. These exercises can be done on a training (exercise) mat, on the floor, on a table or on a bed. Use what ever works the best and is most comfortable for you Knee exercises include:   Leg Lifts - While your knee is still immobilized in a splint or cast, you can do straight leg raises. Lift the leg to 60 degrees, hold for 3 sec, and slowly lower the leg. Repeat 10-20 times 2-3 times daily. Perform this exercise against resistance later as your knee gets better.   Quad and Hamstring Sets - Tighten up the muscle on the front of the thigh (Quad) and hold for 5-10 sec. Repeat this 10-20 times hourly.  Hamstring sets are done by pushing the foot backward against an object and holding for 5-10 sec. Repeat as with quad sets.   Leg Slides: Lying on your back, slowly slide your foot toward your buttocks, bending your knee up off the floor (only go as far as is comfortable). Then slowly slide your foot back down until your leg is flat on the floor again.  Angel Wings: Lying on your back spread your legs to the side as far apart as you can without causing discomfort.  A rehabilitation program following serious knee injuries can speed recovery and prevent re-injury in the future due to weakened muscles. Contact your doctor or a physical therapist for more information on knee rehabilitation.   IF YOU ARE TRANSFERRED TO A SKILLED REHAB FACILITY If the patient is transferred to a skilled rehab facility following release from the hospital, a list of the current medications will be sent to the facility for the patient to continue.  When discharged from the skilled rehab facility, please have the facility set up the patient's [place] prior to being released. Also, the skilled facility will be responsible for providing the patient with their medications at time of release from the facility to include their pain medication, the muscle relaxants, and their blood thinner medication. If the patient is still at the rehab facility at time of the two week follow up appointment, the skilled rehab facility will also need to assist the patient in arranging follow up appointment in our office and any transportation needs.  MAKE SURE YOU:   Understand these instructions.   Get help right away if you are not doing well or get worse.    Pick up stool softner and laxative for home use following surgery while on pain medications. Do not submerge incision under water. Please use good hand washing techniques while changing dressing each day. May shower starting three days after surgery. Please use a  clean towel to pat the incision dry following showers. Continue to use ice for pain and swelling after surgery. Do not use any lotions or creams on the incision until instructed by your surgeon.

## 2019-08-26 ENCOUNTER — Encounter (HOSPITAL_COMMUNITY): Payer: Self-pay | Admitting: Orthopedic Surgery

## 2019-08-26 DIAGNOSIS — I739 Peripheral vascular disease, unspecified: Secondary | ICD-10-CM | POA: Diagnosis not present

## 2019-08-26 DIAGNOSIS — Z87891 Personal history of nicotine dependence: Secondary | ICD-10-CM | POA: Diagnosis not present

## 2019-08-26 DIAGNOSIS — M170 Bilateral primary osteoarthritis of knee: Secondary | ICD-10-CM | POA: Diagnosis not present

## 2019-08-26 DIAGNOSIS — Z79899 Other long term (current) drug therapy: Secondary | ICD-10-CM | POA: Diagnosis not present

## 2019-08-26 DIAGNOSIS — E785 Hyperlipidemia, unspecified: Secondary | ICD-10-CM | POA: Diagnosis not present

## 2019-08-26 DIAGNOSIS — I10 Essential (primary) hypertension: Secondary | ICD-10-CM | POA: Diagnosis not present

## 2019-08-26 DIAGNOSIS — G4700 Insomnia, unspecified: Secondary | ICD-10-CM | POA: Diagnosis not present

## 2019-08-26 DIAGNOSIS — Z96651 Presence of right artificial knee joint: Secondary | ICD-10-CM | POA: Diagnosis not present

## 2019-08-26 DIAGNOSIS — J309 Allergic rhinitis, unspecified: Secondary | ICD-10-CM | POA: Diagnosis not present

## 2019-08-26 LAB — BASIC METABOLIC PANEL
Anion gap: 8 (ref 5–15)
BUN: 13 mg/dL (ref 8–23)
CO2: 24 mmol/L (ref 22–32)
Calcium: 8.2 mg/dL — ABNORMAL LOW (ref 8.9–10.3)
Chloride: 106 mmol/L (ref 98–111)
Creatinine, Ser: 0.96 mg/dL (ref 0.61–1.24)
GFR calc Af Amer: >60 mL/min (ref >60)
GFR calc non Af Amer: >60 mL/min (ref >60)
Glucose, Bld: 123 mg/dL — ABNORMAL HIGH (ref 70–99)
Potassium: 4.5 mmol/L (ref 3.5–5.1)
Sodium: 138 mmol/L (ref 135–145)

## 2019-08-26 LAB — CBC
HCT: 37.6 % — ABNORMAL LOW (ref 39.0–52.0)
Hemoglobin: 12.5 g/dL — ABNORMAL LOW (ref 13.0–17.0)
MCH: 31.6 pg (ref 26.0–34.0)
MCHC: 33.2 g/dL (ref 30.0–36.0)
MCV: 94.9 fL (ref 80.0–100.0)
Platelets: 236 10*3/uL (ref 150–400)
RBC: 3.96 MIL/uL — ABNORMAL LOW (ref 4.22–5.81)
RDW: 13.2 % (ref 11.5–15.5)
WBC: 14.4 10*3/uL — ABNORMAL HIGH (ref 4.0–10.5)
nRBC: 0 % (ref 0.0–0.2)

## 2019-08-26 NOTE — Progress Notes (Signed)
Physical Therapy Treatment Patient Details Name: Joshua [name] MRN: [id number] DOB: 31-May-1946 Today's Date: 08/26/2019    History of Present Illness Patient is 73 y/o male s/p Lt TKA on 08/25/19 with PMH significant for PTSD, HTN, HLD, GERD, PA, Rt TKA in 2015, and ACDF C3-T1.    PT Comments    Reviewed/practiced exercises and gait training. Pt did not feel he needed to practice stairs again. Instructed pt to perform TKA HEP 2x/day until he begins OP PT. All education completed. Okay to d/c from PT standpoint   Follow Up Recommendations  Follow surgeon's recommendation for DC plan and follow-up therapies     Equipment Recommendations  None recommended by PT    Recommendations for Other Services       Precautions / Restrictions Precautions Precautions: Fall Restrictions Weight Bearing Restrictions: No Other Position/Activity Restrictions: WBAT    Mobility  Bed Mobility Overal bed mobility: Modified Independent                Transfers Overall transfer level: Needs assistance Equipment used: Rolling walker (2 wheeled) Transfers: Sit to/from Stand Sit to Stand: Supervision         General transfer comment: VCs hand placement.  Ambulation/Gait Ambulation/Gait assistance: Supervision Gait Distance (Feet): 125 Feet Assistive device: Rolling walker (2 wheeled) Gait Pattern/deviations: Step-to pattern;Step-through pattern;Decreased stride length     General Gait Details: for safety.   Stairs Stairs: (pt did not feel he needed to practice stairs again on today)           Wheelchair Mobility    Modified Rankin (Stroke Patients Only)       Balance Overall balance assessment: Needs assistance         Standing balance support: Bilateral upper extremity supported Standing balance-Leahy Scale: Fair                              Cognition Arousal/Alertness: Awake/alert Behavior During Therapy: WFL for tasks  assessed/performed Overall Cognitive Status: Within Functional Limits for tasks assessed                                        Exercises Total Joint Exercises Ankle Circles/Pumps: AROM;Both;10 reps;Supine Quad Sets: AROM;Both;10 reps;Supine Heel Slides: AROM;Left;10 reps;Supine Hip ABduction/ADduction: AROM;10 reps;Left;Supine Straight Leg Raises: AROM;Left;10 reps;Supine Long Arc Quad: AROM;Left;10 reps;Seated Knee Flexion: AROM;Left;10 reps;Seated Goniometric ROM: ~10-105 degrees    General Comments        Pertinent Vitals/Pain Pain Assessment: 0-10 Pain Score: 6  Pain Location: L knee Pain Descriptors / Indicators: Discomfort;Sore;Aching Pain Intervention(s): Monitored during session;Ice applied;Repositioned    Home Living                      Prior Function            PT Goals (current goals can now be found in the care plan section) Progress towards PT goals: Progressing toward goals    Frequency    7X/week      PT Plan Current plan remains appropriate    Co-evaluation              AM-PAC PT "6 Clicks" Mobility   Outcome Measure  Help needed turning from your back to your side while in a flat bed without using bedrails?: None Help needed moving from lying on your back  to sitting on the side of a flat bed without using bedrails?: None Help needed moving to and from a bed to a chair (including a wheelchair)?: A Little Help needed standing up from a chair using your arms (e.g., wheelchair or bedside chair)?: A Little Help needed to walk in hospital room?: A Little Help needed climbing 3-5 steps with a railing? : A Little 6 Click Score: 20    End of Session Equipment Utilized During Treatment: Gait belt Activity Tolerance: Patient tolerated treatment well Patient left: in chair;with call bell/phone within reach;with family/visitor present   PT Visit Diagnosis: Other abnormalities of gait and mobility (R26.89);Pain Pain  - Right/Left: Left Pain - part of body: Knee     Time: [id number] PT Time Calculation (min) (ACUTE ONLY): 24 min  Charges:  $Gait Training: 8-22 mins $Therapeutic Exercise: 8-22 mins               Weston Anna, PT Acute Rehabilitation Services Pager: [phone] Office: [phone]

## 2019-08-26 NOTE — [place] Notes (Signed)
Subjective: 1 Day Post-Op Procedure(s) (LRB): TOTAL KNEE ARTHROPLASTY (Left) Patient reports pain as mild.   Patient seen in rounds by Dr. Wynelle Link. Patient is well, and has had no acute complaints or problems. States he is ready to go home. Was unable to discharge on outpatient basis yesterday due to difficulty voiding post-op. Voiding four times last night without difficulty once admitted to the orthopedic floor. Denies chest pain, SOB, or calf pain.  We will continue therapy today.   Objective: Vital signs in last 24 hours: Temp:  [97.4 F (36.3 C)-98.3 F (36.8 C)] 98.3 F (36.8 C) (12/01 0542) Pulse Rate:  [61-96] 65 (12/01 0542) Resp:  [10-23] 18 (12/01 0542) BP: (132-169)/(75-107) 134/83 (12/01 0542) SpO2:  [96 %-100 %] 100 % (12/01 0542)  Intake/Output from previous day:  Intake/Output Summary (Last 24 hours) at 08/26/2019 0735 Last data filed at 08/26/2019 0710 Gross per 24 hour  Intake 3455.06 ml  Output 3815 ml  Net -359.94 ml     Intake/Output this shift: Total I/O In: 310 [I.V.:310] Out: -   Labs: Recent Labs    08/26/19 0255  HGB 12.5*   Recent Labs    08/26/19 0255  WBC 14.4*  RBC 3.96*  HCT 37.6*  PLT 236   Recent Labs    08/26/19 0255  NA 138  K 4.5  CL 106  CO2 24  BUN 13  CREATININE 0.96  GLUCOSE 123*  CALCIUM 8.2*   Exam: General - Patient is Alert and Oriented Extremity - Neurologically intact Neurovascular intact Sensation intact distally Dorsiflexion/Plantar flexion intact Dressing - dressing C/D/I Motor Function - intact, moving foot and toes well on exam.   Past Medical History:  Diagnosis Date  . Chronic anxiety   . Chronic rhinitis   . Claustrophobia   . Diverticulosis of colon (without mention of hemorrhage)   . DJD (degenerative joint disease)    OA AND PAIN RIGHT KNEE. DDD including fusions lumbar spine  . GERD (gastroesophageal reflux disease)    ONLY WITH CERTAIN FOODS  . History of total right knee  replacement   . Hx of vertigo   . Hyperlipidemia   . Hypertension   . 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 TOS (thoracic outlet syndrome)    Saw a physical therapist--PAIN RIGHT SHOULDER- MUCH IMPROVED    Assessment/Plan: 1 Day Post-Op Procedure(s) (LRB): TOTAL KNEE ARTHROPLASTY (Left) Principal Problem:   OA (osteoarthritis) of knee Active Problems:   S/P total knee arthroplasty, left   S/P total knee arthroplasty  Estimated body mass index is 25.59 kg/m as calculated from the following:   Height as of this encounter: 6\' 1"  (1.854 m).   Weight as of this encounter: 88 kg. Advance diet Up with therapy D/C IV fluids  Anticipated LOS equal to or greater than 2 midnights due to - Age 74 and older with one or more of the following:  - Obesity  - Expected need for hospital services (PT, OT, Nursing) required for safe  discharge  - Anticipated need for postoperative skilled nursing care or inpatient rehab  - Active co-morbidities: None OR   - Unanticipated findings during/Post Surgery: None  - Patient is a high risk of re-admission due to: None    DVT Prophylaxis - Aspirin Weight bearing as tolerated. D/C O2 and pulse ox and try on room air. Hemovac pulled without difficulty, will continue therapy today.  Plan is to go Home after hospital stay. Plan for  discharge this AM after one session of physical therapy. Scheduled for OPPT at [facility]. Follow-up in the office in 2 weeks.   Theresa Duty, PA-C Orthopedic Surgery 08/26/2019, 7:35 AM

## 2019-08-27 NOTE — Addendum Note (Signed)
Addendum  created 08/27/19 [id number] by Brennan Bailey, MD   Clinical Note Signed, Intraprocedure Blocks edited

## 2019-08-28 DIAGNOSIS — R531 Weakness: Secondary | ICD-10-CM | POA: Diagnosis not present

## 2019-08-28 DIAGNOSIS — Z96652 Presence of left artificial knee joint: Secondary | ICD-10-CM | POA: Diagnosis not present

## 2019-08-28 DIAGNOSIS — R269 Unspecified abnormalities of gait and mobility: Secondary | ICD-10-CM | POA: Diagnosis not present

## 2019-09-01 DIAGNOSIS — R531 Weakness: Secondary | ICD-10-CM | POA: Diagnosis not present

## 2019-09-01 DIAGNOSIS — R269 Unspecified abnormalities of gait and mobility: Secondary | ICD-10-CM | POA: Diagnosis not present

## 2019-09-01 DIAGNOSIS — Z96652 Presence of left artificial knee joint: Secondary | ICD-10-CM | POA: Diagnosis not present

## 2019-09-03 DIAGNOSIS — R269 Unspecified abnormalities of gait and mobility: Secondary | ICD-10-CM | POA: Diagnosis not present

## 2019-09-03 DIAGNOSIS — R531 Weakness: Secondary | ICD-10-CM | POA: Diagnosis not present

## 2019-09-03 DIAGNOSIS — Z96652 Presence of left artificial knee joint: Secondary | ICD-10-CM | POA: Diagnosis not present

## 2019-09-05 DIAGNOSIS — Z96652 Presence of left artificial knee joint: Secondary | ICD-10-CM | POA: Diagnosis not present

## 2019-09-05 DIAGNOSIS — R531 Weakness: Secondary | ICD-10-CM | POA: Diagnosis not present

## 2019-09-05 DIAGNOSIS — R269 Unspecified abnormalities of gait and mobility: Secondary | ICD-10-CM | POA: Diagnosis not present

## 2019-09-08 DIAGNOSIS — R269 Unspecified abnormalities of gait and mobility: Secondary | ICD-10-CM | POA: Diagnosis not present

## 2019-09-08 DIAGNOSIS — Z96652 Presence of left artificial knee joint: Secondary | ICD-10-CM | POA: Diagnosis not present

## 2019-09-08 DIAGNOSIS — R531 Weakness: Secondary | ICD-10-CM | POA: Diagnosis not present

## 2019-09-11 DIAGNOSIS — R269 Unspecified abnormalities of gait and mobility: Secondary | ICD-10-CM | POA: Diagnosis not present

## 2019-09-11 DIAGNOSIS — R531 Weakness: Secondary | ICD-10-CM | POA: Diagnosis not present

## 2019-09-11 DIAGNOSIS — Z96652 Presence of left artificial knee joint: Secondary | ICD-10-CM | POA: Diagnosis not present

## 2019-09-15 DIAGNOSIS — R531 Weakness: Secondary | ICD-10-CM | POA: Diagnosis not present

## 2019-09-15 DIAGNOSIS — R269 Unspecified abnormalities of gait and mobility: Secondary | ICD-10-CM | POA: Diagnosis not present

## 2019-09-15 DIAGNOSIS — Z96652 Presence of left artificial knee joint: Secondary | ICD-10-CM | POA: Diagnosis not present

## 2019-09-17 DIAGNOSIS — Z96652 Presence of left artificial knee joint: Secondary | ICD-10-CM | POA: Diagnosis not present

## 2019-09-17 DIAGNOSIS — R531 Weakness: Secondary | ICD-10-CM | POA: Diagnosis not present

## 2019-09-17 DIAGNOSIS — R269 Unspecified abnormalities of gait and mobility: Secondary | ICD-10-CM | POA: Diagnosis not present

## 2019-09-25 DIAGNOSIS — R531 Weakness: Secondary | ICD-10-CM | POA: Diagnosis not present

## 2019-09-25 DIAGNOSIS — R269 Unspecified abnormalities of gait and mobility: Secondary | ICD-10-CM | POA: Diagnosis not present

## 2019-09-25 DIAGNOSIS — Z96652 Presence of left artificial knee joint: Secondary | ICD-10-CM | POA: Diagnosis not present

## 2019-09-29 DIAGNOSIS — R531 Weakness: Secondary | ICD-10-CM | POA: Diagnosis not present

## 2019-09-29 DIAGNOSIS — R269 Unspecified abnormalities of gait and mobility: Secondary | ICD-10-CM | POA: Diagnosis not present

## 2019-09-29 DIAGNOSIS — Z96652 Presence of left artificial knee joint: Secondary | ICD-10-CM | POA: Diagnosis not present

## 2019-09-30 DIAGNOSIS — Z471 Aftercare following joint replacement surgery: Secondary | ICD-10-CM | POA: Diagnosis not present

## 2019-09-30 DIAGNOSIS — Z96652 Presence of left artificial knee joint: Secondary | ICD-10-CM | POA: Diagnosis not present

## 2019-10-01 DIAGNOSIS — R531 Weakness: Secondary | ICD-10-CM | POA: Diagnosis not present

## 2019-10-01 DIAGNOSIS — Z96652 Presence of left artificial knee joint: Secondary | ICD-10-CM | POA: Diagnosis not present

## 2019-10-01 DIAGNOSIS — R269 Unspecified abnormalities of gait and mobility: Secondary | ICD-10-CM | POA: Diagnosis not present

## 2019-10-06 DIAGNOSIS — Z96652 Presence of left artificial knee joint: Secondary | ICD-10-CM | POA: Diagnosis not present

## 2019-10-06 DIAGNOSIS — R269 Unspecified abnormalities of gait and mobility: Secondary | ICD-10-CM | POA: Diagnosis not present

## 2019-10-06 DIAGNOSIS — R531 Weakness: Secondary | ICD-10-CM | POA: Diagnosis not present

## 2019-10-08 DIAGNOSIS — R531 Weakness: Secondary | ICD-10-CM | POA: Diagnosis not present

## 2019-10-08 DIAGNOSIS — R269 Unspecified abnormalities of gait and mobility: Secondary | ICD-10-CM | POA: Diagnosis not present

## 2019-10-08 DIAGNOSIS — Z96652 Presence of left artificial knee joint: Secondary | ICD-10-CM | POA: Diagnosis not present

## 2019-10-10 ENCOUNTER — Other Ambulatory Visit: Payer: Self-pay | Admitting: Family Medicine

## 2019-10-13 DIAGNOSIS — Z96652 Presence of left artificial knee joint: Secondary | ICD-10-CM | POA: Diagnosis not present

## 2019-10-13 DIAGNOSIS — R531 Weakness: Secondary | ICD-10-CM | POA: Diagnosis not present

## 2019-10-13 DIAGNOSIS — R269 Unspecified abnormalities of gait and mobility: Secondary | ICD-10-CM | POA: Diagnosis not present

## 2019-10-15 DIAGNOSIS — R269 Unspecified abnormalities of gait and mobility: Secondary | ICD-10-CM | POA: Diagnosis not present

## 2019-10-15 DIAGNOSIS — R531 Weakness: Secondary | ICD-10-CM | POA: Diagnosis not present

## 2019-10-15 DIAGNOSIS — Z96652 Presence of left artificial knee joint: Secondary | ICD-10-CM | POA: Diagnosis not present

## 2019-10-22 ENCOUNTER — Ambulatory Visit: Payer: Medicare Other

## 2019-10-22 DIAGNOSIS — R531 Weakness: Secondary | ICD-10-CM | POA: Diagnosis not present

## 2019-10-22 DIAGNOSIS — R269 Unspecified abnormalities of gait and mobility: Secondary | ICD-10-CM | POA: Diagnosis not present

## 2019-10-22 DIAGNOSIS — Z96652 Presence of left artificial knee joint: Secondary | ICD-10-CM | POA: Diagnosis not present

## 2019-10-23 ENCOUNTER — Encounter: Payer: Self-pay | Admitting: Family Medicine

## 2019-10-23 ENCOUNTER — Ambulatory Visit (INDEPENDENT_AMBULATORY_CARE_PROVIDER_SITE_OTHER): Payer: No Typology Code available for payment source | Admitting: Family Medicine

## 2019-10-23 ENCOUNTER — Ambulatory Visit: Payer: Medicare Other

## 2019-10-23 ENCOUNTER — Other Ambulatory Visit: Payer: Self-pay

## 2019-10-23 ENCOUNTER — Ambulatory Visit (INDEPENDENT_AMBULATORY_CARE_PROVIDER_SITE_OTHER): Payer: Medicare Other

## 2019-10-23 VITALS — BP 120/70 | HR 67 | Temp 98.5°F | Ht 73.0 in | Wt 197.6 lb

## 2019-10-23 VITALS — BP 120/70 | Temp 98.5°F | Ht 73.0 in | Wt 197.5 lb

## 2019-10-23 DIAGNOSIS — M179 Osteoarthritis of knee, unspecified: Secondary | ICD-10-CM

## 2019-10-23 DIAGNOSIS — M1710 Unilateral primary osteoarthritis, unspecified knee: Secondary | ICD-10-CM

## 2019-10-23 DIAGNOSIS — E785 Hyperlipidemia, unspecified: Secondary | ICD-10-CM

## 2019-10-23 DIAGNOSIS — M4722 Other spondylosis with radiculopathy, cervical region: Secondary | ICD-10-CM

## 2019-10-23 DIAGNOSIS — Z0000 Encounter for general adult medical examination without abnormal findings: Secondary | ICD-10-CM

## 2019-10-23 DIAGNOSIS — G540 Brachial plexus disorders: Secondary | ICD-10-CM

## 2019-10-23 DIAGNOSIS — J301 Allergic rhinitis due to pollen: Secondary | ICD-10-CM

## 2019-10-23 LAB — COMPREHENSIVE METABOLIC PANEL
ALT: 15 U/L (ref 0–53)
AST: 16 U/L (ref 0–37)
Albumin: 4.3 g/dL (ref 3.5–5.2)
Alkaline Phosphatase: 71 U/L (ref 39–117)
BUN: 16 mg/dL (ref 6–23)
CO2: 29 mEq/L (ref 19–32)
Calcium: 9.4 mg/dL (ref 8.4–10.5)
Chloride: 104 mEq/L (ref 96–112)
Creatinine, Ser: 1.05 mg/dL (ref 0.40–1.50)
GFR: 68.99 mL/min (ref >60.00)
Glucose, Bld: 85 mg/dL (ref 70–99)
Potassium: 5.2 mEq/L — ABNORMAL HIGH (ref 3.5–5.1)
Sodium: 139 mEq/L (ref 135–145)
Total Bilirubin: 0.6 mg/dL (ref 0.2–1.2)
Total Protein: 7.4 g/dL (ref 6.0–8.3)

## 2019-10-23 LAB — LDL CHOLESTEROL, DIRECT: Direct LDL: 122 mg/dL

## 2019-10-23 LAB — CBC WITH DIFFERENTIAL/PLATELET
Basophils Absolute: 0 10*3/uL (ref 0.0–0.1)
Basophils Relative: 0.6 % (ref 0.0–3.0)
Eosinophils Absolute: 0.3 10*3/uL (ref 0.0–0.7)
Eosinophils Relative: 4.6 % (ref 0.0–5.0)
HCT: 44.8 % (ref 39.0–52.0)
Hemoglobin: 15 g/dL (ref 13.0–17.0)
Lymphocytes Relative: 34.2 % (ref 12.0–46.0)
Lymphs Abs: 2.1 10*3/uL (ref 0.7–4.0)
MCHC: 33.5 g/dL (ref 30.0–36.0)
MCV: 91.6 fl (ref 78.0–100.0)
Monocytes Absolute: 0.8 10*3/uL (ref 0.1–1.0)
Monocytes Relative: 13.8 % — ABNORMAL HIGH (ref 3.0–12.0)
Neutro Abs: 2.9 10*3/uL (ref 1.4–7.7)
Neutrophils Relative %: 46.8 % (ref 43.0–77.0)
Platelets: 263 10*3/uL (ref 150.0–400.0)
RBC: 4.89 Mil/uL (ref 4.22–5.81)
RDW: 14.6 % (ref 11.5–15.5)
WBC: 6.1 10*3/uL (ref 4.0–10.5)

## 2019-10-23 MED ORDER — ROSUVASTATIN CALCIUM 20 MG PO TABS: ORAL_TABLET | ORAL | 3 refills | Status: DC

## 2019-10-23 NOTE — [place] Notes (Signed)
This visit is being conducted via phone call due to the COVID-19 pandemic. This patient has given me verbal consent via phone to conduct this visit, patient states they are participating from their home address. Some vital signs may be absent or patient reported.   Patient identification: identified by name, DOB, and current address.  Location provider: [facility], Office Persons participating in the virtual visit: Denman George LPN, patient, and Dr. Garret Reddish    Subjective:   Joshua [name] is a 74 y.o. male who presents for Medicare Annual/Subsequent preventive examination.  Review of Systems:   Cardiac Risk Factors include: advanced age (>39men, >82 women);male gender;dyslipidemia    Objective:    Vitals: BP 120/70   Temp 98.5 F (36.9 C)   Ht 6\' 1"  (1.854 m)   Wt 197 lb 8.5 oz (89.6 kg)   BMI 26.06 kg/m   Body mass index is 26.06 kg/m.  Advanced Directives 10/23/2019 08/25/2019 08/20/2019 10/16/2018 10/04/2017 11/29/2015 07/21/2014  Does Patient Have a Medical Advance Directive? Yes Yes Yes Yes Yes Yes Yes  Type of Paramedic of [place];Living will [place];Living will [place];Living will [place];Living will - Living will [place]  Does patient want to make changes to medical advance directive? No - Patient declined No - Patient declined - No - Patient declined - - -  Copy of [place] in Chart? Yes - validated most recent copy scanned in chart (See row information) No - copy requested - Yes - validated most recent copy scanned in chart (See row information) - Yes -    Tobacco Social History   Tobacco Use  Smoking Status Former Smoker  . Packs/day: 0.50  . Years: 5.00  . Pack years: 2.50  . Types: Cigarettes  . Quit date: 09/25/1973  . Years since quitting: 46.1  Smokeless Tobacco Never Used     Counseling given: Not Answered   Clinical  Intake:  Pre-visit preparation completed: Yes  Diabetes: No  How often do you need to have someone help you when you read instructions, pamphlets, or other written materials from your doctor or pharmacy?: 1 - Never  Interpreter Needed?: No  Information entered by :: Denman George LPN  Past Medical History:  Diagnosis Date  . Chronic anxiety   . Chronic rhinitis   . Claustrophobia   . Diverticulosis of colon (without mention of hemorrhage)   . DJD (degenerative joint disease)    OA AND PAIN RIGHT KNEE. DDD including fusions lumbar spine  . GERD (gastroesophageal reflux disease)    ONLY WITH CERTAIN FOODS  . History of total right knee replacement   . Hx of vertigo   . Hyperlipidemia   . Hypertension   . 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 TOS (thoracic outlet syndrome)    Saw a physical therapist--PAIN RIGHT SHOULDER- MUCH IMPROVED   Past Surgical History:  Procedure Laterality Date  . ANTERIOR CERVICAL DECOMP/DISCECTOMY FUSION N/A 11/30/2015   Procedure: CERVICAL SEVEN-THORACIC ONE ANTERIOR CERVICAL DISCECTOMY/FUSION;  Surgeon: Earnie Larsson, MD;  Location: [place] [facility];  Service: Neurosurgery;  Laterality: N/A;  . CERVICAL DISC SURGERY  2009   c3-4 5-6/ by Dr Trenton Gammon- FUSION  . COLONOSCOPY    . TONSILLECTOMY     AS A CHILD  . TOTAL KNEE ARTHROPLASTY Right 07/21/2014   Procedure: TOTAL RIGHT KNEE ARTHROPLASTY;  Surgeon: Mauri Pole, MD;  Location: [facility];  Service: Orthopedics;  Laterality: Right;  . TOTAL KNEE ARTHROPLASTY Left 08/25/2019   Procedure: TOTAL KNEE ARTHROPLASTY;  Surgeon: Gaynelle Arabian, MD;  Location: [facility];  Service: Orthopedics;  Laterality: Left;  57min   Family History  Problem Relation Age of Onset  . Other Mother        37- "old age". long health in grandparents as well  . Other Father        71- "old age"  . Colon polyps Father        adenomas  . Healthy Sister   . Colon cancer Paternal Aunt    Social History    Socioeconomic History  . Marital status: Married    Spouse name: Not on file  . Number of children: Not on file  . Years of education: Not on file  . Highest education level: Not on file  Occupational History  . Occupation: Retired    Comment: Tree surgeon  Tobacco Use  . Smoking status: Former Smoker    Packs/day: 0.50    Years: 5.00    Pack years: 2.50    Types: Cigarettes    Quit date: 09/25/1973    Years since quitting: 46.1  . Smokeless tobacco: Never Used  Substance and Sexual Activity  . Alcohol use: Yes    Alcohol/week: 7.0 standard drinks    Types: 7 Standard drinks or equivalent per week    Comment:  2 drinks per night weekend, vodka & grapefruit juice   . Drug use: No  . Sexual activity: Yes  Other Topics Concern  . Not on file  Social History Narrative   Married 1982. 1 daughter. 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    Went to [employer] in [employer].    Goes to [employer].       Hobbies: golf, work out at [employer] at [place], place up at [employer].    Social Determinants of Health   Financial Resource Strain:   . Difficulty of Paying Living Expenses: Not on file  Food Insecurity:   . Worried About Charity fundraiser in the Last Year: Not on file  . Ran Out of Food in the Last Year: Not on file  Transportation Needs:   . Lack of Transportation (Medical): Not on file  . Lack of Transportation (Non-Medical): Not on file  Physical Activity:   . Days of Exercise per Week: Not on file  . Minutes of Exercise per Session: Not on file  Stress:   . Feeling of Stress : Not on file  Social Connections:   . Frequency of Communication with Friends and Family: Not on file  . Frequency of Social Gatherings with Friends and Family: Not on file  . Attends Religious Services: Not on file  . Active Member of Clubs or Organizations: Not on file  . Attends English as a second language teacher Meetings: Not on file  . Marital Status: Not on file    Outpatient Encounter Medications as of 10/23/2019  Medication Sig  . Fexofenadine HCl (ALLEGRA PO) Take 1 tablet by mouth daily as needed (Allergies).  . gabapentin (NEURONTIN) 300 MG capsule Take 1 capsule (300 mg total) by mouth as directed. Take a 300 mg capsule three times a day for two weeks Then a 300 mg capsule twice a day for two weeks Then a 300 mg  capsule once a day for two weeks then discontinue the Gabapentin  . Melatonin 10 MG TABS Take 10 mg by mouth at bedtime.  . rosuvastatin (CRESTOR) 20 MG tablet Take 1 tablet once a week   No facility-administered encounter medications on file as of 10/23/2019.    Activities of Daily Living In your present state of health, do you have any difficulty performing the following activities: 10/23/2019 08/25/2019  Hearing? N Y  Comment - h/a  Vision? N N  Difficulty concentrating or making decisions? N N  Walking or climbing stairs? N Y  Dressing or bathing? N N  Doing errands, shopping? N N  Preparing Food and eating ? N -  Using the Toilet? N -  In the past six months, have you accidently leaked urine? N -  Do you have problems with loss of bowel control? N -  Managing your Medications? N -  Managing your Finances? N -  Housekeeping or managing your Housekeeping? N -  Some recent data might be hidden    Patient Care Team: Marin Olp, MD as PCP - General (Family Medicine) Gaynelle Arabian, MD as Consulting Physician (Orthopedic Surgery) Jarome Matin, MD as Consulting Physician (Dermatology) Tanda Rockers, MD as Consulting Physician (Pulmonary Disease) Earnie Larsson, MD as Consulting Physician (Neurosurgery) [facility], [facility] as Consulting Physician   Assessment:   This is a routine wellness examination for [facility].  Exercise Activities and Dietary recommendations Current Exercise Habits: Home exercise routine, Time (Minutes): 30, Frequency  (Times/Week): 5, Weekly Exercise (Minutes/Week): 150, Intensity: Mild  Goals    . DIET - EAT MORE FRUITS AND VEGETABLES     Have less red meat  Check out  online nutrition programs as GumSearch.nl and http://vang.com/; fit39me; Look for foods with "whole" wheat; bran; oatmeal etc Shot at the farmer's markets in season for fresher choices  Watch for "hydrogenated" on the label of oils which are trans-fats.  Watch for "high fructose corn syrup" in snacks, yogurt or ketchup  Meats have less marbling; bright colored fruits and vegetables;  Canned; dump out liquid and wash vegetables. Be mindful of what we are eating  Portion control is essential to a health weight! Sit down; take a break and enjoy your meal; take smaller bites; put the fork down between bites;  It takes 20 minutes to get full; so check in with your fullness cues and stop eating when you start to fill full              Fall Risk Fall Risk  10/23/2019 10/23/2019 07/14/2019 10/16/2018 10/04/2017  Falls in the past year? 0 0 0 0 No  Number falls in past yr: 0 0 0 - -  Injury with Fall? 0 0 0 - -  Risk for fall due to : Orthopedic patient - - - -  Follow up Falls evaluation completed;Education provided;Falls prevention discussed - - - -   Is the patient's home free of loose throw rugs in walkways, pet beds, electrical cords, etc?   yes      Grab bars in the bathroom? yes      Handrails on the stairs?   yes      Adequate lighting?   yes  Depression Screen PHQ 2/9 Scores 10/23/2019 10/23/2019 07/14/2019 10/16/2018  PHQ - 2 Score 0 0 0 0    Cognitive Function MMSE - Mini Mental State Exam 10/16/2018 10/04/2017  Not completed: - (No Data)  Orientation to time 5 -  Orientation  to Place 5 -  Registration 3 -  Attention/ Calculation 4 -  Recall 2 -  Language- name 2 objects 2 -  Language- repeat 1 -  Language- follow 3 step command 3 -  Language- read & follow direction 1 -  Write a sentence 1 -  Copy design 1 -    Total score 28 -     6CIT Screen 10/23/2019  What Year? 0 points  What month? 0 points  What time? 0 points  Count back from 20 0 points  Months in reverse 0 points  Repeat phrase 0 points  Total Score 0    Immunization History  Administered Date(s) Administered  . Fluad Quad(high Dose 65+) 07/14/2019  . Influenza Split 07/26/2011, 07/02/2015  . Influenza Whole 06/25/2012  . Influenza, High Dose Seasonal PF 07/19/2017  . Influenza,inj,Quad PF,6+ Mos 07/08/2013, 07/20/2014  . Influenza-Unspecified 07/04/2018  . Pneumococcal Conjugate-13 07/16/2015  . Pneumococcal Polysaccharide-23 06/25/2006, 11/10/2010  . Td 11/23/2004  . Tdap 07/16/2015  . Zoster Recombinat (Shingrix) 07/02/2017, 09/03/2017    Qualifies for Shingles Vaccine? Shingrix completed   Screening Tests Health Maintenance  Topic Date Due  . COLONOSCOPY  04/03/2023  . TETANUS/TDAP  07/15/2025  . INFLUENZA VACCINE  Completed  . Hepatitis C Screening  Completed  . PNA vac Low Risk Adult  Completed   Cancer Screenings: Lung: Low Dose CT Chest recommended if Age 48-80 years, 30 pack-year currently smoking OR have quit w/in 15years. Patient does not qualify. Colorectal: colonoscopy 04/02/13; repeat in 10 years      Plan:  I have personally reviewed and addressed the Medicare Annual Wellness questionnaire and have noted the following in the patient's chart:  A. Medical and social history B. Use of alcohol, tobacco or illicit drugs  C. Current medications and supplements D. Functional ability and status E.  Nutritional status F.  Physical activity G. Advance directives H. List of other physicians I.  Hospitalizations, surgeries, and ER visits in previous 12 months J.  [place] such as hearing and vision if needed, cognitive and depression L. Referrals, records requested, and appointments- none   In addition, I have reviewed and discussed with patient certain preventive protocols, quality metrics,  and best practice recommendations. A written personalized care plan for preventive services as well as general preventive health recommendations were provided to patient.   Signed,  Denman George, LPN  Nurse Health Advisor   Nurse Notes: no additional

## 2019-10-23 NOTE — [place] Notes (Addendum)
Phone: [phone]   Subjective:  Patient presents today for their annual physical. Chief complaint-noted.   See problem oriented charting- ROS- full  review of systems was completed and negative  except for: workgin through left knee pain  The following were reviewed and entered/updated in epic: Past Medical History:  Diagnosis Date  . Chronic anxiety   . Chronic rhinitis   . Claustrophobia   . Diverticulosis of colon (without mention of hemorrhage)   . DJD (degenerative joint disease)    OA AND PAIN RIGHT KNEE. DDD including fusions lumbar spine  . GERD (gastroesophageal reflux disease)    ONLY WITH CERTAIN FOODS  . History of total right knee replacement   . Hx of vertigo   . Hyperlipidemia   . Hypertension   . 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 TOS (thoracic outlet syndrome)    Saw a physical therapist--PAIN RIGHT SHOULDER- MUCH IMPROVED   Patient Active Problem List   Diagnosis Date Noted  . Insomnia 07/18/2015    Priority: Medium  . Hyperlipidemia LDL goal <130 09/16/2008    Priority: Medium  . Chronic rhinitis 09/16/2008    Priority: Medium  . Former smoker 03/26/2017    Priority: Low  . GERD (gastroesophageal reflux disease) 03/26/2017    Priority: Low  . Allergic rhinitis 03/26/2017    Priority: Low  . Cervical spondylosis with radiculopathy 11/30/2015    Priority: Low  . TOS (thoracic outlet syndrome)     Priority: Low  . Cough 10/27/2008    Priority: Low  . OA (osteoarthritis) of knee 09/16/2008    Priority: Low  . VERTIGO 09/16/2008    Priority: Low  . 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 S/P total knee arthroplasty 08/25/2019  . Senile purpura ([place]) 10/04/2017   Past Surgical History:  Procedure Laterality Date  . ANTERIOR CERVICAL DECOMP/DISCECTOMY FUSION N/A 11/30/2015   Procedure: CERVICAL SEVEN-THORACIC ONE ANTERIOR CERVICAL DISCECTOMY/FUSION;  Surgeon: Earnie Larsson, MD;  Location: [place] [facility];  Service:  Neurosurgery;  Laterality: N/A;  . CERVICAL DISC SURGERY  2009   c3-4 5-6/ by Dr Trenton Gammon- FUSION  . COLONOSCOPY    . TONSILLECTOMY     AS A CHILD  . TOTAL KNEE ARTHROPLASTY Right 07/21/2014   Procedure: TOTAL RIGHT KNEE ARTHROPLASTY;  Surgeon: Mauri Pole, MD;  Location: [facility];  Service: Orthopedics;  Laterality: Right;  . TOTAL KNEE ARTHROPLASTY Left 08/25/2019   Procedure: TOTAL KNEE ARTHROPLASTY;  Surgeon: Gaynelle Arabian, MD;  [facility]: [facility];  Service: Orthopedics;  Laterality: Left;  75min    Family History  Problem Relation Age of Onset  . Other Mother        36- "old age". long health in grandparents as well  . Other Father        22- "old age"  . Colon polyps Father        adenomas  . Healthy Sister   . Colon cancer Paternal Aunt     Medications- reviewed and updated Current Outpatient Medications  Medication Sig Dispense Refill  . Fexofenadine HCl (ALLEGRA PO) Take 1 tablet by mouth daily as needed (Allergies).    . Melatonin 10 MG TABS Take 10 mg by mouth at bedtime.    . rosuvastatin (CRESTOR) 20 MG tablet Take 1 tablet once a week 13 tablet 3  . gabapentin (NEURONTIN) 300 MG capsule Take 1 capsule (300 mg total) by mouth as directed. Take a 300 mg capsule three times  a day for two weeks Then a 300 mg capsule twice a day for two weeks Then a 300 mg capsule once a day for two weeks then discontinue the Gabapentin 84 capsule 0   No current facility-administered medications for this visit.    Allergies-reviewed and updated No Known Allergies  Social History   Social History Narrative   Married 1982. 1 daughter. 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    Went to [employer] in [place].    Goes to [employer].       Hobbies: golf, work out at [employer] at [place], place up at [employer].    Objective  Objective:  BP 120/70   Pulse 67   Temp 98.5 F (36.9  C)   Ht 6\' 1"  (1.854 m)   Wt 197 lb 9.6 oz (89.6 kg)   SpO2 98%   BMI 26.07 kg/m  Gen: NAD, resting comfortably HEENT: Mask not removed due to covid 19. TM normal. Bridge of nose normal. Eyelids normal.  Neck: no thyromegaly or cervical lymphadenopathy  CV: RRR no murmurs rubs or gallops Lungs: CTAB no crackles, wheeze, rhonchi Abdomen: soft/nontender/nondistended/normal bowel sounds. No rebound or guarding.  Ext: 1+ edema on left without calf pain, no edema on right Skin: warm, dry Neuro: grossly normal, moves all extremities, PERRLA    Assessment and Plan  74 y.o. male presenting for annual physical.  Health Maintenance counseling: 1. Anticipatory guidance: Patient counseled regarding regular dental exams yes q6 months other than with coivd, eye exams yes yearly,  avoiding smoking and second hand smoke yes , limiting alcohol to 2 beverages per day.   2. Risk factor reduction:  Advised patient of need for regular exercise and diet rich and fruits and vegetables to reduce risk of heart attack and stroke. Exercise- active, doing PT for knee- holding off on [facility] until vaccine.  Diet-cooks at home.  Wt Readings from Last 3 Encounters:  10/23/19 197 lb 9.6 oz (89.6 kg)  08/25/19 193 lb 15.7 oz (88 kg)  08/20/19 194 lb (88 kg)  3. Immunizations/screenings/ancillary studies- fully up to date other than covid 19 Immunization History  Administered Date(s) Administered  . Fluad Quad(high Dose 65+) 07/14/2019  . Influenza Split 07/26/2011, 07/02/2015  . Influenza Whole 06/25/2012  . Influenza, High Dose Seasonal PF 07/19/2017  . Influenza,inj,Quad PF,6+ Mos 07/08/2013, 07/20/2014  . Influenza-Unspecified 07/04/2018  . Pneumococcal Conjugate-13 07/16/2015  . Pneumococcal Polysaccharide-23 06/25/2006, 11/10/2010  . Td 11/23/2004  . Tdap 07/16/2015  . Zoster Recombinat (Shingrix) 07/02/2017, 09/03/2017  4. Prostate cancer screening- skip psa today with prior low risk trend  Lab Results    Component Value Date   PSA 0.630 06/18/2019   PSA 0.54 11/10/2010   PSA 0.75 08/05/2009   5. Colon cancer screening - was told he did not need anymore- 03/2013 normal 6. Skin cancer screening- [place] derm Dr. Jarome Matin. advised regular sunscreen use. Denies worrisome, changing, or new skin lesions.  7. never smoker  Status of chronic or acute concerns   S/p left knee replacement-Slower than he would liek progress on left knee after surgery but has been told by surgeon and PT doing well. 2015 had right knee.  -doing gabapentin at bedtime son  Hyperlipdemia- LDL down from 131 to 99 with just once a week crestor. Will continue current dose- no side effects.   Thoracic  outlet syndrome/cervical spondylosis- was seeing Dr. Trenton Gammon- not having issues anymore. Prior fusion  Recommended follow up:  1 year physical Future Appointments  Date Time Provider [place]  10/23/2019 11:30 AM Denman George B, LPN [facility]   Lab/Order associations:not fasting   ICD-10-CM   1. Preventative health care  Z00.00   2. Hyperlipidemia LDL goal <130  E78.5   3. TOS (thoracic outlet syndrome)  G54.0   4. Osteoarthritis of knee, unspecified laterality, unspecified osteoarthritis type  M17.10   5. Cervical spondylosis with radiculopathy  M47.22   6. Seasonal allergic rhinitis due to pollen  J30.1     Meds ordered this encounter  Medications  . rosuvastatin (CRESTOR) 20 MG tablet    Sig: Take 1 tablet once a week    Dispense:  13 tablet    Refill:  3    Return precautions advised.  Garret Reddish, MD

## 2019-10-23 NOTE — Patient Instructions (Addendum)
Please stop by lab before you go If you do not have mychart- we will call you about results within 5 business days of [place] receiving them.  If you have mychart- we will send your results within 3 business days of [place] receiving them.  If abnormal or we want to clarify a result, we will call or mychart you to make sure you receive the message.  If you have questions or concerns or don't hear within 5-7 days, please send [place] a message or call us.   Glad you are doing well! Joshua [name] and the baby are doing well!

## 2019-10-23 NOTE — Patient Instructions (Signed)
[name] , Thank you for taking time to come for your Medicare Wellness Visit. I appreciate your ongoing commitment to your health goals. Please review the following plan we discussed and let me know if I can assist you in the future.   Screening recommendations/referrals: Colorectal Screening: up to date; last colonoscopy 04/02/13  Vision and Dental Exams: Recommended annual ophthalmology exams for early detection of glaucoma and other disorders of the eye Recommended annual dental exams for proper oral hygiene  Vaccinations: Influenza vaccine: completed 07/16/19 Pneumococcal vaccine: up to date; last 07/16/15 Tdap vaccine: up to date; last 07/16/15  Shingles vaccine: Shingrix completed  Covid vaccine: To schedule with other locations try [id number] for [facility] or [email or web address] for [facility]; also see attached handout   Advanced directives: We have received a copy of your POA (Power of [place]) and/or Living Will. These documents can be located in your chart.  Goals: Recommend to drink at least 6-8 8oz glasses of water per day and consume a balanced diet rich in fresh fruits and vegetables.   Next appointment: Please schedule your Annual Wellness Visit with your Nurse Health Advisor in one year.  Preventive Care 60 Years and Older, Male Preventive care refers to lifestyle choices and visits with your health care provider that can promote health and wellness. What does preventive care include?  A yearly physical exam. This is also called an annual well check.  Dental exams once or twice a year.  Routine eye exams. Ask your health care provider how often you should have your eyes checked.  Personal lifestyle choices, including:  Daily care of your teeth and gums.  Regular physical activity.  Eating a healthy diet.  Avoiding tobacco and drug use.  Limiting alcohol use.  Practicing safe sex.  Taking low doses of aspirin every day if recommended by your health  care provider..  Taking vitamin and mineral supplements as recommended by your health care provider. What happens during an annual well check? The services and screenings done by your health care provider during your annual well check will depend on your age, overall health, lifestyle risk factors, and family history of disease. Counseling  Your health care provider may ask you questions about your:  Alcohol use.  Tobacco use.  Drug use.  Emotional well-being.  Home and relationship well-being.  Sexual activity.  Eating habits.  History of falls.  Memory and ability to understand (cognition).  Work and work Statistician. Screening  You may have the following tests or measurements:  Height, weight, and BMI.  Blood pressure.  Lipid and cholesterol levels. These may be checked every 5 years, or more frequently if you are over 53 years old.  Skin check.  Lung cancer screening. You may have this screening every year starting at age 20 if you have a 30-pack-year history of smoking and currently smoke or have quit within the past 15 years.  Fecal occult blood test (FOBT) of the stool. You may have this test every year starting at age 40.  Flexible sigmoidoscopy or colonoscopy. You may have a sigmoidoscopy every 5 years or a colonoscopy every 10 years starting at age 74.  Prostate cancer screening. Recommendations will vary depending on your family history and other risks.  Hepatitis C blood test.  Hepatitis B blood test.  Sexually transmitted disease (STD) testing.  Diabetes screening. This is done by checking your blood sugar (glucose) after you have not eaten for a while (fasting). You may have this done every 1-3 years.  Abdominal aortic aneurysm (AAA) screening. You may need this if you are a current or former smoker.  Osteoporosis. You may be screened starting at age 67 if you are at high risk. Talk with your health care provider about your test results,  treatment options, and if necessary, the need for more tests. Vaccines  Your health care provider may recommend certain vaccines, such as:  Influenza vaccine. This is recommended every year.  Tetanus, diphtheria, and acellular pertussis (Tdap, Td) vaccine. You may need a Td booster every 10 years.  Zoster vaccine. You may need this after age 30.  Pneumococcal 13-valent conjugate (PCV13) vaccine. One dose is recommended after age 74.  Pneumococcal polysaccharide (PPSV23) vaccine. One dose is recommended after age 46. Talk to your health care provider about which screenings and vaccines you need and how often you need them. This information is not intended to replace advice given to you by your health care provider. Make sure you discuss any questions you have with your health care provider. Document Released: 10/08/2015 Document Revised: 05/31/2016 Document Reviewed: 07/13/2015 Elsevier Interactive Patient Education  2017 [place] Prevention in the Home Falls can cause injuries. They can happen to people of all ages. There are many things you can do to make your home safe and to help prevent falls. What can I do on the outside of my home?  Regularly fix the edges of walkways and driveways and fix any cracks.  Remove anything that might make you trip as you walk through a door, such as a raised step or threshold.  Trim any bushes or trees on the path to your home.  Use bright outdoor lighting.  Clear any walking paths of anything that might make someone trip, such as rocks or tools.  Regularly check to see if handrails are loose or broken. Make sure that both sides of any steps have handrails.  Any raised decks and porches should have guardrails on the edges.  Have any leaves, snow, or ice cleared regularly.  Use sand or salt on walking paths during winter.  Clean up any spills in your garage right away. This includes oil or grease spills. What can I do in the  bathroom?  Use night lights.  Install grab bars by the toilet and in the tub and shower. Do not use towel bars as grab bars.  Use non-skid mats or decals in the tub or shower.  If you need to sit down in the shower, use a plastic, non-slip stool.  Keep the floor dry. Clean up any water that spills on the floor as soon as it happens.  Remove soap buildup in the tub or shower regularly.  Attach bath mats securely with double-sided non-slip rug tape.  Do not have throw rugs and other things on the floor that can make you trip. What can I do in the bedroom?  Use night lights.  Make sure that you have a light by your bed that is easy to reach.  Do not use any sheets or blankets that are too big for your bed. They should not hang down onto the floor.  Have a firm chair that has side arms. You can use this for support while you get dressed.  Do not have throw rugs and other things on the floor that can make you trip. What can I do in the kitchen?  Clean up any spills right away.  Avoid walking on wet floors.  Keep items that you use  a lot in easy-to-reach places.  If you need to reach something above you, use a strong step stool that has a grab bar.  Keep electrical cords out of the way.  Do not use floor polish or wax that makes floors slippery. If you must use wax, use non-skid floor wax.  Do not have throw rugs and other things on the floor that can make you trip. What can I do with my stairs?  Do not leave any items on the stairs.  Make sure that there are handrails on both sides of the stairs and use them. Fix handrails that are broken or loose. Make sure that handrails are as long as the stairways.  Check any carpeting to make sure that it is firmly attached to the stairs. Fix any carpet that is loose or worn.  Avoid having throw rugs at the top or bottom of the stairs. If you do have throw rugs, attach them to the floor with carpet tape.  Make sure that you have a  light switch at the top of the stairs and the bottom of the stairs. If you do not have them, ask someone to add them for you. What else can I do to help prevent falls?  Wear shoes that:  Do not have high heels.  Have rubber bottoms.  Are comfortable and fit you well.  Are closed at the toe. Do not wear sandals.  If you use a stepladder:  Make sure that it is fully opened. Do not climb a closed stepladder.  Make sure that both sides of the stepladder are locked into place.  Ask someone to hold it for you, if possible.  Clearly mark and make sure that you can see:  Any grab bars or handrails.  First and last steps.  Where the edge of each step is.  Use tools that help you move around (mobility aids) if they are needed. These include:  Canes.  Walkers.  Scooters.  Crutches.  Turn on the lights when you go into a dark area. Replace any light bulbs as soon as they burn out.  Set up your furniture so you have a clear path. Avoid moving your furniture around.  If any of your floors are uneven, fix them.  If there are any pets around you, be aware of where they are.  Review your medicines with your doctor. Some medicines can make you feel dizzy. This can increase your chance of falling. Ask your doctor what other things that you can do to help prevent falls. This information is not intended to replace advice given to you by your health care provider. Make sure you discuss any questions you have with your health care provider. Document Released: 07/08/2009 Document Revised: 02/17/2016 Document Reviewed: 10/16/2014 Elsevier Interactive Patient Education  2017 [employer].

## 2019-11-04 ENCOUNTER — Encounter: Payer: Self-pay | Admitting: Family Medicine

## 2019-11-04 ENCOUNTER — Ambulatory Visit (INDEPENDENT_AMBULATORY_CARE_PROVIDER_SITE_OTHER): Payer: Medicare Other | Admitting: Family Medicine

## 2019-11-04 ENCOUNTER — Other Ambulatory Visit: Payer: Self-pay

## 2019-11-04 VITALS — Temp 98.7°F | Ht 73.0 in | Wt 197.0 lb

## 2019-11-04 DIAGNOSIS — U071 COVID-19: Secondary | ICD-10-CM | POA: Diagnosis not present

## 2019-11-04 NOTE — Progress Notes (Signed)
Phone [phone] Virtual visit via phonenote   Subjective:   Chief Complaint  Patient presents with  . Covid Exposure    Positive 10/29/2019   This visit type was conducted due to national recommendations for restrictions regarding the COVID-19 Pandemic (e.g. social distancing).  This format is felt to be most appropriate for this patient at this time balancing risks to patient and risks to population by having him in for in person visit.  All issues noted in this document were discussed and addressed.  No physical exam was performed (except for noted visual exam or audio findings with Telehealth visits).  The patient has consented to conduct a Telehealth visit and understands insurance will be billed.   Our team/I connected with Vanita Panda at  3:40 PM EST by phone (patient did not have equipment for webex) and verified that I am speaking with the correct person using two identifiers.  Location patient: [facility] Location provider: [facility], office Persons participating in the virtual visit:  patient  Time on phone: 18 minutes Counseling provided about covid 19, antibody treatment  Our team/I discussed the limitations of evaluation and management by telemedicine and the availability of in person appointments. In light of current covid-19 pandemic, patient also understands that we are trying to protect them by minimizing in office contact if at all possible.  The patient expressed consent for telemedicine visit and agreed to proceed. Patient understands insurance will be billed.   Past Medical History-  Patient Active Problem List   Diagnosis Date Noted  . Insomnia 07/18/2015    Priority: Medium  . Hyperlipidemia LDL goal <130 09/16/2008    Priority: Medium  . Chronic rhinitis 09/16/2008    Priority: Medium  . Former smoker 03/26/2017    Priority: Low  . GERD (gastroesophageal reflux disease) 03/26/2017    Priority: Low  . Allergic rhinitis 03/26/2017    Priority: Low  .  Cervical spondylosis with radiculopathy 11/30/2015    Priority: Low  . TOS (thoracic outlet syndrome)     Priority: Low  . Cough 10/27/2008    Priority: Low  . OA (osteoarthritis) of knee 09/16/2008    Priority: Low  . VERTIGO 09/16/2008    Priority: Low  . 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 S/P total knee arthroplasty 08/25/2019    Medications- reviewed and updated Current Outpatient Medications  Medication Sig Dispense Refill  . Fexofenadine HCl (ALLEGRA PO) Take 1 tablet by mouth daily as needed (Allergies).    . gabapentin (NEURONTIN) 300 MG capsule Take 1 capsule (300 mg total) by mouth as directed. Take a 300 mg capsule three times a day for two weeks Then a 300 mg capsule twice a day for two weeks Then a 300 mg capsule once a day for two weeks then discontinue the Gabapentin 84 capsule 0  . Melatonin 10 MG TABS Take 10 mg by mouth at bedtime.    . rosuvastatin (CRESTOR) 20 MG tablet Take 1 tablet once a week 13 tablet 3   No current facility-administered medications for this visit.     Objective:  Temp 98.7 F (37.1 C) (Temporal)   Ht 6\' 1"  (1.854 m)   Wt 197 lb (89.4 kg)   BMI 25.99 kg/m  self reported vitals  Nonlabored voice, normal speech      Assessment and Plan   # covid 19  S:Patient was tested positive on 10/29/2019- tested at [facility] on [place].No major symptoms. Just has some nasal congestion  and runny nose and slight cough tapering off starting on the 3rd. Slight sinus headache. Patient's wife got virus from beautician and he got it from her.   ROS- No fever, chills,  shortness of breath, fatigue, body aches, sore throat,  nausea, vomiting, diarrhea, or new loss of taste or smell.  A/P:  Patient with testing confirming covid 19 with first day of covid 19 symptoms 10/29/2019 and did rapid test same day at [facility] Therefore: - recommended patient watch closely for shortness of breath or confusion or worsening symptoms and if those occur he should  contact us immediately or seek care in the emergency department -recommended patient consider purchasing pulse oximeter and if levels 94% or below persistently- seek care at the hospital - for quarantine needs to be at least 10 days since first symptom AND at least 24 hours fever free without fever reducing medications AND have improvement in respiratory symptoms  - earliest possible day out of quarantine january 13th, recommendations for patient - we also discussed close contacts would need 14 day quarantine after last close contact with patient - only close contact would be wife.   If High risk for complications-we discussed potential for antibody treatment- he is interested -Discussed patient may be contacted by [facility] by Kathrine Haddock, NP from [phone] or they can contact her at this #.   -Discussed would have to wait 90 days after antibody infusion before can receive COVID-19 vaccination  -If he does not hear by tomorrow afternoon asked him to give [place] a call to check on antibody infusion  Recommended follow up: As needed for acute concerns Future Appointments  Date Time Provider [place]  10/28/2020 10:40 AM Marin Olp, MD [facility]    Lab/Order associations:   ICD-10-CM   1. COVID-19  U07.1    Return precautions advised.  Garret Reddish, MD

## 2019-11-04 NOTE — Patient Instructions (Signed)
There are no preventive care reminders to display for this patient.  Depression screen [facility] 2/9 10/23/2019 10/23/2019 07/14/2019  Decreased Interest 0 0 0  Down, Depressed, Hopeless 0 0 0  PHQ - 2 Score 0 0 0    Recommended follow up: No follow-ups on file.

## 2019-11-05 ENCOUNTER — Telehealth: Payer: Self-pay | Admitting: Unknown Physician Specialty

## 2019-11-05 ENCOUNTER — Ambulatory Visit (HOSPITAL_COMMUNITY)
Admission: RE | Admit: 2019-11-05 | Discharge: 2019-11-05 | Disposition: A | Discharge status: Home / Self Care | Payer: Medicare Other | Source: Ambulatory Visit | Attending: Pulmonary Disease | Admitting: Pulmonary Disease

## 2019-11-05 ENCOUNTER — Other Ambulatory Visit: Payer: Self-pay | Admitting: Unknown Physician Specialty

## 2019-11-05 DIAGNOSIS — Z23 Encounter for immunization: Secondary | ICD-10-CM | POA: Diagnosis not present

## 2019-11-05 DIAGNOSIS — U071 COVID-19: Secondary | ICD-10-CM

## 2019-11-05 MED ORDER — SODIUM CHLORIDE 0.9 % IV SOLN
Freq: Once | INTRAVENOUS | Status: AC
  Filled 2019-11-05: qty 10

## 2019-11-05 MED ORDER — SODIUM CHLORIDE 0.9 % IV SOLN
INTRAVENOUS | Status: DC | PRN
  Administered 2019-11-05: 250 mL via INTRAVENOUS

## 2019-11-05 MED ORDER — EPINEPHRINE 0.3 MG/0.3ML IJ SOAJ: 0.3000 mg | Freq: Once | INTRAMUSCULAR | Status: DC | PRN

## 2019-11-05 MED ORDER — ALBUTEROL SULFATE HFA 108 (90 BASE) MCG/ACT IN AERS: 2.0000 | INHALATION_SPRAY | Freq: Once | RESPIRATORY_TRACT | Status: DC | PRN

## 2019-11-05 MED ORDER — FAMOTIDINE IN NACL 20-0.9 MG/50ML-% IV SOLN: 20.0000 mg | Freq: Once | INTRAVENOUS | Status: DC | PRN

## 2019-11-05 MED ORDER — METHYLPREDNISOLONE SODIUM SUCC 125 MG IJ SOLR: 125.0000 mg | Freq: Once | INTRAMUSCULAR | Status: DC | PRN

## 2019-11-05 MED ORDER — DIPHENHYDRAMINE HCL 50 MG/ML IJ SOLN: 50.0000 mg | Freq: Once | INTRAMUSCULAR | Status: DC | PRN

## 2019-11-05 NOTE — Progress Notes (Signed)
  Diagnosis: COVID-19  Physician: Dr. Joya Gaskins  Procedure: [facility]fusion [facility] Med: casirivimab\imdevimab infusion - Provided patient with casirivimab\imdevimab fact sheet for patients, parents and caregivers prior to infusion.  Complications: No immediate complications noted.  Discharge: Discharged home   Janine Ores 11/05/2019

## 2019-11-05 NOTE — Discharge Instructions (Signed)
10 Things You Can Do to Manage Your COVID-19 Symptoms at Home If you have possible or confirmed COVID-19: 1. Stay home from work and school. And stay away from other public places. If you must go out, avoid using any kind of public transportation, ridesharing, or taxis. 2. Monitor your symptoms carefully. If your symptoms get worse, call your healthcare provider immediately. 3. Get rest and stay hydrated. 4. If you have a medical appointment, call the healthcare provider ahead of time and tell them that you have or may have COVID-19. 5. For medical emergencies, call 911 and notify the dispatch personnel that you have or may have COVID-19. 6. Cover your cough and sneezes with a tissue or use the inside of your elbow. 7. Wash your hands often with soap and water for at least 20 seconds or clean your hands with an alcohol-based hand sanitizer that contains at least 60% alcohol. 8. As much as possible, stay in a specific room and away from other people in your home. Also, you should use a separate bathroom, if available. If you need to be around other people in or outside of the home, wear a mask. 9. Avoid sharing personal items with other people in your household, like dishes, towels, and bedding. 10. Clean all surfaces that are touched often, like counters, tabletops, and doorknobs. Use household cleaning sprays or wipes according to the label instructions. cdc.gov/coronavirus 03/26/2019 This information is not intended to replace advice given to you by your health care provider. Make sure you discuss any questions you have with your health care provider. Document Revised: 08/28/2019 Document Reviewed: 08/28/2019 Elsevier Patient Education  2020 [employer] Inc[employer]  

## 2019-11-05 NOTE — Telephone Encounter (Signed)
  I connected by phone with Vanita Panda on 11/05/2019 at 8:42 AM to discuss the potential use of an new treatment for mild to moderate COVID-19 viral infection in non-hospitalized patients.  This patient is a 74 y.o. male that meets the FDA criteria for Emergency Use Authorization of bamlanivimab or casirivimab\imdevimab.  Has a (+) direct SARS-CoV-2 viral test result  Has mild or moderate COVID-19   Is ? 75 years of age and weighs ? 40 kg  Is NOT hospitalized due to COVID-19  Is NOT requiring oxygen therapy or requiring an increase in baseline oxygen flow rate due to COVID-19  Is within 10 days of symptom onset  Has at least one of the high risk factor(s) for progression to severe COVID-19 and/or hospitalization as defined in EUA.  Specific high risk criteria : >/= 75 yo   I have spoken and communicated the following to the patient or parent/caregiver:  1. FDA has authorized the emergency use of bamlanivimab and casirivimab\imdevimab for the treatment of mild to moderate COVID-19 in adults and pediatric patients with positive results of direct SARS-CoV-2 viral testing who are 15 years of age and older weighing at least 40 kg, and who are at high risk for progressing to severe COVID-19 and/or hospitalization.  2. The significant known and potential risks and benefits of bamlanivimab and casirivimab\imdevimab, and the extent to which such potential risks and benefits are unknown.  3. Information on available alternative treatments and the risks and benefits of those alternatives, including clinical trials.  4. Patients treated with bamlanivimab and casirivimab\imdevimab should continue to self-isolate and use infection control measures (e.g., wear mask, isolate, social distance, avoid sharing personal items, clean and disinfect "high touch" surfaces, and frequent handwashing) according to CDC guidelines.   5. The patient or parent/caregiver has the option to accept or refuse  bamlanivimab or casirivimab\imdevimab .  After reviewing this information with the patient, The patient agreed to proceed with receiving the casirivimab\imdevimab infusion and will be provided a copy of the Fact sheet prior to receiving the infusion.Kathrine Haddock 11/05/2019 8:42 AM

## 2020-05-20 DIAGNOSIS — L821 Other seborrheic keratosis: Secondary | ICD-10-CM | POA: Diagnosis not present

## 2020-05-20 DIAGNOSIS — L718 Other rosacea: Secondary | ICD-10-CM | POA: Diagnosis not present

## 2020-09-01 DIAGNOSIS — M25562 Pain in left knee: Secondary | ICD-10-CM | POA: Diagnosis not present

## 2020-09-01 DIAGNOSIS — Z96652 Presence of left artificial knee joint: Secondary | ICD-10-CM | POA: Diagnosis not present

## 2020-09-02 NOTE — Progress Notes (Addendum)
Phone [phone] In person visit   Subjective:   Joshua [name] is a 74 y.o. year old very pleasant male patient who presents for/with See problem oriented charting Chief Complaint  Patient presents with  . Joint Swelling    L Knee swollen. Pt had a full knee replacement a year ago, he did see Dr. Rolm Gala and he did aspirate the knee but it is back swollen. Pt states that the knee is really stiff, pt states that the knee is "Hot" to the touch   . Labs Only    Pt has paper labs that Dr. Wynelle Link wants him to have drawn    This visit occurred during the SARS-CoV-2 public health emergency.  Safety protocols were in place, including screening questions prior to the visit, additional usage of staff PPE, and extensive cleaning of exam room while observing appropriate contact time as indicated for disinfecting solutions.   Past Medical History-  Patient Active Problem List   Diagnosis Date Noted  . Insomnia 07/18/2015    Priority: Medium  . Hyperlipidemia LDL goal <130 09/16/2008    Priority: Medium  . Chronic rhinitis 09/16/2008    Priority: Medium  . Former smoker 03/26/2017    Priority: Low  . GERD (gastroesophageal reflux disease) 03/26/2017    Priority: Low  . Allergic rhinitis 03/26/2017    Priority: Low  . Cervical spondylosis with radiculopathy 11/30/2015    Priority: Low  . TOS (thoracic outlet syndrome)     Priority: Low  . Cough 10/27/2008    Priority: Low  . OA (osteoarthritis) of knee 09/16/2008    Priority: Low  . VERTIGO 09/16/2008    Priority: Low  . 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 S/P total knee arthroplasty 08/25/2019    Medications- reviewed and updated Current Outpatient Medications  Medication Sig Dispense Refill  . Fexofenadine HCl (ALLEGRA PO) Take 1 tablet by mouth daily as needed (Allergies).    . Melatonin 10 MG TABS Take 10 mg by mouth at bedtime.    . rosuvastatin (CRESTOR) 20 MG tablet Take 1 tablet once a week 13 tablet 3  .  gabapentin (NEURONTIN) 300 MG capsule Take 1 capsule (300 mg total) by mouth as directed. Take a 300 mg capsule three times a day for two weeks Then a 300 mg capsule twice a day for two weeks Then a 300 mg capsule once a day for two weeks then discontinue the Gabapentin (Patient not taking: Reported on 09/03/2020) 84 capsule 0   No current facility-administered medications for this visit.     Objective:  BP (!) 158/92   Pulse 61   Temp 98 F (36.7 C) (Temporal)   Ht '6\' 1"'  (1.854 m)   Wt 196 lb 3.2 oz (89 kg)   SpO2 97%   BMI 25.89 kg/m  Gen: NAD, resting comfortably CV: RRR no murmurs rubs or gallops Lungs: CTAB no crackles, wheeze, rhonchi Ext: no edema Skin: warm, dry MSK: significant joint effusion around left knee. Surgical scar noted. Warm to touch compared to right knee    Assessment and Plan  #Swelling of Knee S:november 30th 2020 left knee replacement- had 1 year follow up - has been swollen, tight, sore. Aspirated at last visit on the 8th and got a lot of fluid out - improved for a little while and then worsened again even in last day.   Has some diffuse tenderness. The swelling really bothers him and the distension of the skin makes things  feel numb A/P: swelling of knee 1 year out from prior surgery that did not have prolonged response to aspiration. Pending labs. Dr. Elmyra Ricks is requesting CRP and ESR- ordered today and will forward. Also check uric acid though doubt gout with duration. Wonder if we will need rheumatology input   #hyperlipidemia S: Medication:rosuvastatin 20 mg once a week  Lab Results  Component Value Date   CHOL 166 09/03/2020   HDL 50 09/03/2020   LDLCALC 101 (H) 09/03/2020   LDLDIRECT 122.0 10/23/2019   TRIG 60 09/03/2020   CHOLHDL 3.3 09/03/2020   A/P: update lipid panel given upcoming CPE (can likely skip at that time). We will see how #s look and then reassess- last full lipid panel over a year ago  #elevated BP reading S: medication:  none Home readings #s:  Does not have home cuff BP Readings from Last 3 Encounters:  09/03/20 (!) 158/92  11/05/19 (!) 169/87  10/23/19 120/70  A/P: BP high today but anxious about knee. Last BP was high at infusion for covid- we opted to do some home monitoring and reassess based on those readings  Recommended follow up: keep February visit or sooner if needed Future Appointments  Date Time Provider [place]  10/28/2020 10:40 AM Marin Olp, MD [facility]    Lab/Order associations:   Not fasting- breakfast   ICD-10-CM   1. Pain and swelling of left knee  M25.562 C-reactive protein   M25.462 Sedimentation rate    Uric acid    Uric acid    Sedimentation rate    C-reactive protein  2. Chronic pain of left knee  M25.562 C-reactive protein   G89.29 Sedimentation rate    Uric acid    Uric acid    Sedimentation rate    C-reactive protein  3. Hyperlipidemia LDL goal <130  E78.5 CBC With Differential/Platelet    COMPLETE METABOLIC PANEL WITH GFR    Lipid Panel w/reflex Direct LDL    Lipid Panel w/reflex Direct LDL    COMPLETE METABOLIC PANEL WITH GFR    CBC With Differential/Platelet  4. Elevated blood pressure reading  R03.0     No orders of the defined types were placed in this encounter.  Return precautions advised.  Garret Reddish, MD

## 2020-09-02 NOTE — Patient Instructions (Addendum)
   Health Maintenance Due  Topic Date Due  . INFLUENZA VACCINE has been done  05/31/2020- team please log this 04/25/2020  . COVID-19 Vaccine (3 - Booster for Moderna series) lets consider the booster but id like to see results of your knee workup first.  09/02/2020   Your blood pressure was up today but I can understand you having some angst about the situation. I would like for you to buy/use a home cuff to check at least 4x a week. Your goal is <135/85.  Bring your home cuff and your log of blood pressures with you to next visit. If your average is above 135/85 please let me know sooner than visit  Please stop by lab before you go If you have mychart- we will send your results within 3 business days of [place] receiving them.  If you do not have mychart- we will call you about results within 5 business days of [place] receiving them.  *please note we are currently using [facility] which has a longer processing time than [place] typically so labs may not come back as quickly as in the past *please also note that you will see labs on mychart as soon as they post. I will later go in and write notes on them- will say "notes from Dr. Yong Channel"  Recommended follow up:  Keep February visit

## 2020-09-03 ENCOUNTER — Other Ambulatory Visit: Payer: Self-pay

## 2020-09-03 ENCOUNTER — Ambulatory Visit (INDEPENDENT_AMBULATORY_CARE_PROVIDER_SITE_OTHER): Payer: Medicare Other | Admitting: Family Medicine

## 2020-09-03 ENCOUNTER — Encounter: Payer: Self-pay | Admitting: Family Medicine

## 2020-09-03 VITALS — BP 158/92 | HR 61 | Temp 98.0°F | Ht 73.0 in | Wt 196.2 lb

## 2020-09-03 DIAGNOSIS — M25562 Pain in left knee: Secondary | ICD-10-CM | POA: Diagnosis not present

## 2020-09-03 DIAGNOSIS — G8929 Other chronic pain: Secondary | ICD-10-CM

## 2020-09-03 DIAGNOSIS — R030 Elevated blood-pressure reading, without diagnosis of hypertension: Secondary | ICD-10-CM

## 2020-09-03 DIAGNOSIS — M25462 Effusion, left knee: Secondary | ICD-10-CM

## 2020-09-03 DIAGNOSIS — E785 Hyperlipidemia, unspecified: Secondary | ICD-10-CM

## 2020-09-07 ENCOUNTER — Telehealth: Payer: Self-pay

## 2020-09-07 NOTE — Telephone Encounter (Signed)
Dr. Peri Maris office is requesting the labs that were done on 12/10 for the pt.   Their fax is [phone] ATTN: [facility]

## 2020-09-07 NOTE — Telephone Encounter (Signed)
Labs have been faxed. Confirmation fax has been received.

## 2020-09-08 ENCOUNTER — Telehealth: Payer: Self-pay

## 2020-09-08 ENCOUNTER — Other Ambulatory Visit: Payer: Self-pay

## 2020-09-08 DIAGNOSIS — M25462 Effusion, left knee: Secondary | ICD-10-CM

## 2020-09-08 DIAGNOSIS — M25562 Pain in left knee: Secondary | ICD-10-CM

## 2020-09-08 NOTE — Progress Notes (Signed)
Per chart review, labs were released on 12/10. Neta Ehlers at lab will call [facility] and follow up on results.

## 2020-09-08 NOTE — Telephone Encounter (Signed)
LVM for patient to call back and schedule a lab appt for sedrate they didn't get enough blood and will need to do a redraw.

## 2020-09-09 ENCOUNTER — Other Ambulatory Visit: Payer: Self-pay

## 2020-09-09 ENCOUNTER — Other Ambulatory Visit: Payer: No Typology Code available for payment source

## 2020-09-09 DIAGNOSIS — G8929 Other chronic pain: Secondary | ICD-10-CM | POA: Diagnosis not present

## 2020-09-09 DIAGNOSIS — M25462 Effusion, left knee: Secondary | ICD-10-CM | POA: Diagnosis not present

## 2020-09-09 DIAGNOSIS — M25562 Pain in left knee: Secondary | ICD-10-CM | POA: Diagnosis not present

## 2020-09-09 LAB — COMPLETE METABOLIC PANEL WITH GFR
AG Ratio: 1.4 (calc) (ref 1.0–2.5)
ALT: 20 U/L (ref 9–46)
AST: 20 U/L (ref 10–35)
Albumin: 4.3 g/dL (ref 3.6–5.1)
Alkaline phosphatase (APISO): 66 U/L (ref 35–144)
BUN: 17 mg/dL (ref 7–25)
CO2: 27 mmol/L (ref 20–32)
Calcium: 9.2 mg/dL (ref 8.6–10.3)
Chloride: 105 mmol/L (ref 98–110)
Creat: 1.11 mg/dL (ref 0.70–1.18)
GFR, Est African American: 75 mL/min/{1.73_m2} (ref >=60)
GFR, Est Non African American: 65 mL/min/{1.73_m2} (ref >=60)
Globulin: 3.1 g/dL (calc) (ref 1.9–3.7)
Glucose, Bld: 84 mg/dL (ref 65–99)
Potassium: 5.2 mmol/L (ref 3.5–5.3)
Sodium: 140 mmol/L (ref 135–146)
Total Bilirubin: 0.7 mg/dL (ref 0.2–1.2)
Total Protein: 7.4 g/dL (ref 6.1–8.1)

## 2020-09-09 LAB — CBC WITH DIFFERENTIAL/PLATELET
Absolute Monocytes: 643 cells/uL (ref 200–950)
Basophils Absolute: 32 cells/uL (ref 0–200)
Basophils Relative: 0.5 %
Eosinophils Absolute: 340 cells/uL (ref 15–500)
Eosinophils Relative: 5.4 %
HCT: 43.9 % (ref 38.5–50.0)
Hemoglobin: 15 g/dL (ref 13.2–17.1)
Lymphs Abs: 1688 cells/uL (ref 850–3900)
MCH: 31.3 pg (ref 27.0–33.0)
MCHC: 34.2 g/dL (ref 32.0–36.0)
MCV: 91.5 fL (ref 80.0–100.0)
MPV: 10.8 fL (ref 7.5–12.5)
Monocytes Relative: 10.2 %
Neutro Abs: 3597 cells/uL (ref 1500–7800)
Neutrophils Relative %: 57.1 %
Platelets: 269 10*3/uL (ref 140–400)
RBC: 4.8 10*6/uL (ref 4.20–5.80)
RDW: 12.5 % (ref 11.0–15.0)
Total Lymphocyte: 26.8 %
WBC: 6.3 10*3/uL (ref 3.8–10.8)

## 2020-09-09 LAB — URIC ACID: Uric Acid, Serum: 5.5 mg/dL (ref 4.0–8.0)

## 2020-09-09 LAB — LIPID PANEL W/REFLEX DIRECT LDL
Cholesterol: 166 mg/dL (ref <200)
HDL: 50 mg/dL (ref >=40)
LDL Cholesterol (Calc): 101 mg/dL (calc) — ABNORMAL HIGH
Non-HDL Cholesterol (Calc): 116 mg/dL (calc) (ref <130)
Total CHOL/HDL Ratio: 3.3 (calc) (ref <5.0)
Triglycerides: 60 mg/dL (ref <150)

## 2020-09-09 LAB — TEST AUTHORIZATION

## 2020-09-09 LAB — C-REACTIVE PROTEIN: CRP: 1.1 mg/L (ref <8.0)

## 2020-09-10 LAB — SEDIMENTATION RATE: Sed Rate: 9 mm/h (ref 0–20)

## 2020-09-10 LAB — C-REACTIVE PROTEIN: CRP: 1.8 mg/L (ref <8.0)

## 2020-09-20 DIAGNOSIS — Z20822 Contact with and (suspected) exposure to covid-19: Secondary | ICD-10-CM | POA: Diagnosis not present

## 2020-09-22 ENCOUNTER — Encounter: Payer: Self-pay | Admitting: Family Medicine

## 2020-09-22 ENCOUNTER — Telehealth (INDEPENDENT_AMBULATORY_CARE_PROVIDER_SITE_OTHER): Payer: Medicare Other | Admitting: Family Medicine

## 2020-09-22 VITALS — Ht 73.0 in | Wt 196.2 lb

## 2020-09-22 DIAGNOSIS — U071 COVID-19: Secondary | ICD-10-CM

## 2020-09-22 NOTE — Progress Notes (Signed)
Patient: Joshua [name] MRN: [id number] DOB: Aug 02, 1946 PCP: Shelva Majestic, MD     I connected with Whitney Post on 09/22/20 at 9:51am by a video enabled telemedicine application and verified that I am speaking with the correct person using two identifiers.  Location patient: Home Location provider: [facility], Office Persons participating in this virtual visit: Hiroki Hyppolite and Ivy Lynn and Dr. Artis Flock   I discussed the limitations of evaluation and management by telemedicine and the availability of in person appointments. The patient expressed understanding and agreed to proceed.   Subjective:  Chief Complaint  Patient presents with  . Covid Positive    Symptoms started 09/14/2020    HPI: The patient is a 74 y.o. male who presents today for Positive Covid.  symptoms started on 09/14/20. He is on day 9 of illness. He tested positive on 09/20/20. Seasonally he has sinus issues and his symptoms were very similar to years past including nasal congestion, mild headaches and a cough. He denies any fever/chills, no loss of taste or smell and overall feels fine. He is not short of breath or wheezing and he is walking and worked in the yard yesterday. He feels fine. He has some questions regarding his wife as well. He isn't even sure what prompted him to get tested because he didn't feel sick.   Vaccinated with moderna 02/04/2020 and 03/03/2020.    Review of Systems  Constitutional: Negative for chills, fatigue and fever.  HENT: Negative for congestion, dental problem, ear pain, hearing loss and trouble swallowing.   Eyes: Negative for visual disturbance.  Respiratory: Negative for cough, chest tightness and shortness of breath.   Cardiovascular: Negative for chest pain, palpitations and leg swelling.  Gastrointestinal: Negative for abdominal pain, blood in stool, diarrhea and nausea.  Endocrine: Negative for cold intolerance, polydipsia, polyphagia and polyuria.  Genitourinary: Negative  for dysuria and hematuria.  Musculoskeletal: Negative for arthralgias.  Skin: Negative for rash.  Neurological: Negative for dizziness and headaches.  Psychiatric/Behavioral: Negative for dysphoric mood and sleep disturbance. The patient is not nervous/anxious.     Allergies Patient has No Known Allergies.  Past Medical History Patient  has a past medical history of Chronic anxiety, Chronic rhinitis, Claustrophobia, Diverticulosis of colon (without mention of hemorrhage), DJD (degenerative joint disease), GERD (gastroesophageal reflux disease), History of total right knee replacement, vertigo, Hyperlipidemia, Hypertension, PTSD (post-traumatic stress disorder), and TOS (thoracic outlet syndrome).  Surgical History Patient  has a past surgical history that includes Cervical disc surgery (2009); Colonoscopy; Tonsillectomy; Total knee arthroplasty (Right, 07/21/2014); Anterior cervical decomp/discectomy fusion (N/A, 11/30/2015); and Total knee arthroplasty (Left, 08/25/2019).  Family History Pateint's family history includes Colon cancer in his paternal aunt; Colon polyps in his father; Healthy in his sister; Other in his father and mother.  Social History Patient  reports that he quit smoking about 47 years ago. His smoking use included cigarettes. He has a 2.50 pack-year smoking history. He has never used smokeless tobacco. He reports current alcohol use of about 7.0 standard drinks of alcohol per week. He reports that he does not use drugs.    Objective: Vitals:   09/22/20 0946  Weight: 196 lb 3.4 oz (89 kg)  Height: 6\' 1"  (1.854 m)    Body mass index is 25.89 kg/m.  Physical Exam Vitals reviewed.  Constitutional:      Appearance: Normal appearance. He is well-developed, normal weight and well-nourished.  HENT:     Head: Normocephalic and atraumatic.  Mouth/Throat:     Mouth: Oropharynx is clear and moist.  Eyes:     Extraocular Movements: EOM normal.  Neck:     Thyroid:  No thyromegaly.  Cardiovascular:     Pulses: Intact distal pulses.  Pulmonary:     Effort: Pulmonary effort is normal.     Comments: Talking with no increased effort and looks great.  Neurological:     General: No focal deficit present.     Mental Status: He is alert and oriented to person, place, and time.     Cranial Nerves: No cranial nerve deficit.  Psychiatric:        Mood and Affect: Mood and affect and mood normal.        Behavior: Behavior normal.        Assessment/plan: 1. COVID-19 -day 9 of covid and essentially near symptom free. Looks great.  -discussed vitamins, but he is near end of course and doing great -would start daily vitamin D3 2000IU/day -Allergy medication and flonase  -would quarantine x 10 days (only has one more day)  -any issues he can fu with me this week since Dr. Durene Cal out of office.  -he is planning on getting booster. Advised to wait a few weeks before this.     Return if symptoms worsen or fail to improve.    Orland Mustard, MD  [facility]  09/22/2020

## 2020-09-22 NOTE — Patient Instructions (Signed)
Vitamin D3: 2000IU/day would take this daily until f/u with pcp. Will not hurt you and want vitamin D up with covid.   -can do flonase for allergies    ONLY IF YOU GET COVID can take the following: usually  Have you take this for 10-15 days.   Vitamin D3: 5000IU/day Vitamin C: 1000mg  three times a day Zinc: 100mg /day Quercetin:500mg  twice a day Melatonin: 10mg /night   Mouth wash three times a day (acts/scope/crest) I also do 1% iodine drops in nose 1-2x/day to help clear and stop viral replication  Outside!  I think you guys are doing well with natural immunity plus vaccines though. :)    Let me know if you have any questions! Good to meet you and happy new year!  Dr. 

## 2020-09-30 DIAGNOSIS — Z96652 Presence of left artificial knee joint: Secondary | ICD-10-CM | POA: Diagnosis not present

## 2020-09-30 DIAGNOSIS — M1712 Unilateral primary osteoarthritis, left knee: Secondary | ICD-10-CM | POA: Diagnosis not present

## 2020-10-27 NOTE — Progress Notes (Signed)
Phone: [phone]   Subjective:  Patient presents today for their annual physical. Chief complaint-noted.   See problem oriented charting- ROS- full  review of systems was completed and negative  except for: sinus pressure - seasonal , visual problems - wears glasses, urinary urgency, joint pain, joint swelling- knee  The following were reviewed and entered/updated in epic: Past Medical History:  Diagnosis Date  . Chronic anxiety   . Chronic rhinitis   . Claustrophobia   . Diverticulosis of colon (without mention of hemorrhage)   . DJD (degenerative joint disease)    OA AND PAIN RIGHT KNEE. DDD including fusions lumbar spine  . GERD (gastroesophageal reflux disease)    ONLY WITH CERTAIN FOODS  . History of total right knee replacement   . Hx of vertigo   . Hyperlipidemia   . Hypertension   . 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 TOS (thoracic outlet syndrome)    Saw a physical therapist--PAIN RIGHT SHOULDER- MUCH IMPROVED   Patient Active Problem List   Diagnosis Date Noted  . Insomnia 07/18/2015    Priority: Medium  . Hyperlipidemia LDL goal <130 09/16/2008    Priority: Medium  . Chronic rhinitis 09/16/2008    Priority: Medium  . Former smoker 03/26/2017    Priority: Low  . GERD (gastroesophageal reflux disease) 03/26/2017    Priority: Low  . Allergic rhinitis 03/26/2017    Priority: Low  . Cervical spondylosis with radiculopathy 11/30/2015    Priority: Low  . TOS (thoracic outlet syndrome)     Priority: Low  . Cough 10/27/2008    Priority: Low  . OA (osteoarthritis) of knee 09/16/2008    Priority: Low  . VERTIGO 09/16/2008    Priority: Low  . 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 S/P total knee arthroplasty 08/25/2019   Past Surgical History:  Procedure Laterality Date  . ANTERIOR CERVICAL DECOMP/DISCECTOMY FUSION N/A 11/30/2015   Procedure: CERVICAL SEVEN-THORACIC ONE ANTERIOR CERVICAL DISCECTOMY/FUSION;  Surgeon: Earnie Larsson, MD;  Location: [place];  Service: Neurosurgery;  Laterality: N/A;  . CERVICAL DISC SURGERY  2009   c3-4 5-6/ by Dr Trenton Gammon- FUSION  . COLONOSCOPY    . TONSILLECTOMY     AS A CHILD  . TOTAL KNEE ARTHROPLASTY Right 07/21/2014   Procedure: TOTAL RIGHT KNEE ARTHROPLASTY;  Surgeon: Mauri Pole, MD;  Location: [facility];  Service: Orthopedics;  Laterality: Right;  . TOTAL KNEE ARTHROPLASTY Left 08/25/2019   Procedure: TOTAL KNEE ARTHROPLASTY;  Surgeon: Gaynelle Arabian, MD;  Location: [facility];  Service: Orthopedics;  Laterality: Left;  10mn    Family History  Problem Relation Age of Onset  . Other Mother        86 "old age". long health in grandparents as well  . Other Father        977 "old age"  . Colon polyps Father        adenomas  . Healthy Sister   . Colon cancer Paternal Aunt     Medications- reviewed and updated Current Outpatient Medications  Medication Sig Dispense Refill  . Melatonin 10 MG TABS Take 10 mg by mouth at bedtime.    . rosuvastatin (CRESTOR) 20 MG tablet Take 1 tablet once a week 13 tablet 3   No current facility-administered medications for this visit.    Allergies-reviewed and updated Allergies  Allergen Reactions  . Tamsulosin Anxiety    Social History   Social History Narrative  Married 1982. 1 daughter. 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    Went to [employer] in [employer].    Goes to [employer].       Hobbies: golf, work out at [employer] at [place], place up at [employer].    Objective  Objective:  BP 134/76   Pulse 63   Temp 98.2 F (36.8 C) (Temporal)   Ht '6\' 1"'  (1.854 m)   Wt 194 lb (88 kg)   SpO2 97%   BMI 25.60 kg/m  Gen: NAD, resting comfortably HEENT: Mucous membranes are moist. Oropharynx normal Neck: no thyromegaly CV: RRR no murmurs rubs or gallops Lungs: CTAB no crackles, wheeze, rhonchi Abdomen:  soft/nontender/nondistended/normal bowel sounds. No rebound or guarding.  Ext: no edema Skin: warm, dry Neuro: grossly normal, moves all extremities, PERRLA    Assessment and Plan  75 y.o. male presenting for annual physical.  Health Maintenance counseling: 1. Anticipatory guidance: Patient counseled regarding regular dental exams -q6 months Dr. Altamese Dietrich- some eye drops on systane for dry eye, [facility],  avoiding smoking and second hand smoke , limiting alcohol to 2 beverages per day , no drugs.   2. Risk factor reduction:  Advised patient of need for regular exercise and diet rich and fruits and vegetables to reduce risk of heart attack and stroke. Exercise-02 fitness now instead of [employer].  backed off due to his knee- and had covid around Christmas- trying to work back up- doing some walking. Diet-reasonably healthy diet.  Wt Readings from Last 3 Encounters:  10/28/20 194 lb (88 kg)  09/22/20 196 lb 3.4 oz (89 kg)  09/03/20 196 lb 3.2 oz (89 kg)  3. Immunizations/screenings/ancillary studies- fully up to date  other than covid booster. Also had covid 10/2019 and christmas 2022- was very mild both times. Since just had covid and mild- we are going to consider this as his booster- we opted to hold off Immunization History  Administered Date(s) Administered  . Fluad Quad(high Dose 65+) 07/14/2019  . Influenza Split 07/26/2011, 07/02/2015  . Influenza Whole 06/25/2012  . Influenza, High Dose Seasonal PF 07/19/2017  . Influenza,inj,Quad PF,6+ Mos 07/08/2013, 07/20/2014  . Influenza,inj,quad, With Preservative 07/16/2019  . Influenza-Unspecified 07/04/2018, 05/31/2020  . Moderna Sars-Covid-2 Vaccination 02/04/2020, 03/03/2020  . Pneumococcal Conjugate-13 07/16/2015  . Pneumococcal Polysaccharide-23 06/25/2006, 11/10/2010  . Td 11/23/2004  . Tdap 07/16/2015  . Zoster Recombinat (Shingrix) 07/02/2017, 09/03/2017   4. Prostate cancer screening-  low risk prior psa trend, past age  based screening recs. Urinary urgency stable Lab Results  Component Value Date   PSA 0.630 06/18/2019   PSA 0.54 11/10/2010   PSA 0.75 08/05/2009   5. Colon cancer screening -  normal 03/2013 with Dr. Alanson Aly and was told no recall due to age. Could consider stool cards at 2024 6. Skin cancer screening- [place] derm dr. Jarome Matin- has visit Monday 7. FORMER smoker- 2.5 pack years quit in 1970s . aaa screen negative 04/26/17 8. STD screening - only active with wife   Status of chronic or acute concerns   #left knee swelling after nov 2020 L knee replacement. Has had aspiration but fluid returned. We did some labs for Dr. Elmyra Ricks with CRP and ESR but were reassuring. Uric acid also normal . Right now plan is to give it another 3 months- gets a pressure from the  swelling  #hyperlipidemia- peak LDL 143 so target at least LDL 100 S: Medication: Rosuvastatin 20Mg once a week Lab Results  Component Value Date   CHOL 166 09/03/2020   HDL 50 09/03/2020   LDLCALC 101 (H) 09/03/2020   LDLDIRECT 122.0 10/23/2019   TRIG 60 09/03/2020   CHOLHDL 3.3 09/03/2020  A/P: close to 30% reduction from peak- we opted to continue current medicines   #elevated BP reading likely with stress of knee concern last visit- much better today- continue to monitor. Did some home monitoring and home readings mostly under 140/90. Around holidays had some higher #s. Discussed dash eating plan/low salt diet  #pulmonary nodules- two small nodules on coronary calcium scoring 11/19/2018 with note to check CT in 12 months if high risk or no recheck if not high risk. I would consider smoking history quitting in 1970s and under 3 pack years lower risk. We discussed checking CT- he opts in 10/28/20   Recommended follow up: Return in about 1 year (around 10/28/2021) for physical or sooner if needed.  Lab/Order associations:NOT fasting   ICD-10-CM   1. Preventative health care  Z00.00   2. Gastroesophageal reflux disease without  esophagitis  K21.9   3. Former smoker  Z87.891   4. Hyperlipidemia LDL goal <130  E78.5   5. Pulmonary nodule  R91.1 CT Chest Wo Contrast    No orders of the defined types were placed in this encounter.   Return precautions advised.  Garret Reddish, MD

## 2020-10-27 NOTE — Patient Instructions (Addendum)
Please stop by lab before you go If you have mychart- we will send your results within 3 business days of [place] receiving them.  If you do not have mychart- we will call you about results within 5 business days of [place] receiving them.  *please also note that you will see labs on mychart as soon as they post. I will later go in and write notes on them- will say "notes from Dr. Yong Channel"  Health Maintenance Due  Topic Date Due  . COVID-19 Vaccine (3 - Booster for Moderna series) Patient hasn't had this done yet. We encouraged him to complete this  [email or web address] 09/02/2020   Recommended follow up: Return in about 1 year (around 10/28/2021) for physical or sooner if needed.  Keep an eye on blood pressure- ideally at home <135/85 on average. Watching salt/eating healthy can help (see below)   [email or web address]">  DASH Eating Plan DASH stands for Dietary Approaches to Stop Hypertension. The DASH eating plan is a healthy eating plan that has been shown to:  Reduce high blood pressure (hypertension).  Reduce your risk for type 2 diabetes, heart disease, and stroke.  Help with weight loss. What are tips for following this plan? Reading food labels  Check food labels for the amount of salt (sodium) per serving. Choose foods with less than 5 percent of the Daily Value of sodium. Generally, foods with less than 300 milligrams (mg) of sodium per serving fit into this eating plan.  To find whole grains, look for the word "whole" as the first word in the ingredient list. Shopping  Buy products labeled as "low-sodium" or "no salt added."  Buy fresh foods. Avoid canned foods and pre-made or frozen meals. Cooking  Avoid adding salt when cooking. Use salt-free seasonings or herbs instead of table salt or sea salt. Check with your health care provider or pharmacist before using salt substitutes.  Do not fry foods. Cook foods using healthy  methods such as baking, boiling, grilling, roasting, and broiling instead.  Cook with heart-healthy oils, such as olive, canola, avocado, soybean, or sunflower oil. Meal planning  Eat a balanced diet that includes: ? 4 or more servings of fruits and 4 or more servings of vegetables each day. Try to fill one-half of your plate with fruits and vegetables. ? 6-8 servings of whole grains each day. ? Less than 6 oz (170 g) of lean meat, poultry, or fish each day. A 3-oz (85-g) serving of meat is about the same size as a deck of cards. One egg equals 1 oz (28 g). ? 2-3 servings of low-fat dairy each day. One serving is 1 cup (237 mL). ? 1 serving of nuts, seeds, or beans 5 times each week. ? 2-3 servings of heart-healthy fats. Healthy fats called omega-3 fatty acids are found in foods such as walnuts, flaxseeds, fortified milks, and eggs. These fats are also found in cold-water fish, such as sardines, salmon, and mackerel.  Limit how much you eat of: ? Canned or prepackaged foods. ? Food that is high in trans fat, such as some fried foods. ? Food that is high in saturated fat, such as fatty meat. ? Desserts and other sweets, sugary drinks, and other foods with added sugar. ? Full-fat dairy products.  Do not salt foods before eating.  Do not eat more than 4 egg yolks a week.  Try to eat at least 2 vegetarian meals a week.  Eat more home-cooked food and less restaurant, buffet, and fast food.  Lifestyle  When eating at a restaurant, ask that your food be prepared with less salt or no salt, if possible.  If you drink alcohol: ? Limit how much you use to:  0-1 drink a day for women who are not pregnant.  0-2 drinks a day for men. ? Be aware of how much alcohol is in your drink. In the [place] one drink equals one 12 oz bottle of beer (355 mL), one 5 oz glass of wine (148 mL), or one 1 oz glass of hard liquor (44 mL). General information  Avoid eating more than 2,300 mg of salt a day. If  you have hypertension, you may need to reduce your sodium intake to 1,500 mg a day.  Work with your health care provider to maintain a healthy body weight or to lose weight. Ask what an ideal weight is for you.  Get at least 30 minutes of exercise that causes your heart to beat faster (aerobic exercise) most days of the week. Activities may include walking, swimming, or biking.  Work with your health care provider or dietitian to adjust your eating plan to your individual calorie needs. What foods should I eat? Fruits All fresh, dried, or frozen fruit. Canned fruit in natural juice (without added sugar). Vegetables Fresh or frozen vegetables (raw, steamed, roasted, or grilled). Low-sodium or reduced-sodium tomato and vegetable juice. Low-sodium or reduced-sodium tomato sauce and tomato paste. Low-sodium or reduced-sodium canned vegetables. Grains Whole-grain or whole-wheat bread. Whole-grain or whole-wheat pasta. Brown rice. [place]. Bulgur. Whole-grain and low-sodium cereals. Pita bread. Low-fat, low-sodium crackers. Whole-wheat flour tortillas. Meats and other proteins Skinless chicken or [place]. Ground chicken or [place]. Pork with fat trimmed off. Fish and seafood. Egg whites. Dried beans, peas, or lentils. Unsalted nuts, nut butters, and seeds. Unsalted canned beans. Lean cuts of beef with fat trimmed off. Low-sodium, lean precooked or cured meat, such as sausages or meat loaves. Dairy Low-fat (1%) or fat-free (skim) milk. Reduced-fat, low-fat, or fat-free cheeses. Nonfat, low-sodium ricotta or cottage cheese. Low-fat or nonfat yogurt. Low-fat, low-sodium cheese. Fats and oils Soft margarine without trans fats. Vegetable oil. Reduced-fat, low-fat, or light mayonnaise and salad dressings (reduced-sodium). Canola, safflower, olive, avocado, soybean, and sunflower oils. Avocado. Seasonings and condiments Herbs. Spices. Seasoning mixes without salt. Other foods Unsalted popcorn and  pretzels. Fat-free sweets. The items listed above may not be a complete list of foods and beverages you can eat. Contact a dietitian for more information. What foods should I avoid? Fruits Canned fruit in a light or heavy syrup. Fried fruit. Fruit in cream or butter sauce. Vegetables Creamed or fried vegetables. Vegetables in a cheese sauce. Regular canned vegetables (not low-sodium or reduced-sodium). Regular canned tomato sauce and paste (not low-sodium or reduced-sodium). Regular tomato and vegetable juice (not low-sodium or reduced-sodium). Angie Fava. Olives. Grains Baked goods made with fat, such as croissants, muffins, or some breads. Dry pasta or rice meal packs. Meats and other proteins Fatty cuts of meat. Ribs. Fried meat. Berniece Salines. Bologna, salami, and other precooked or cured meats, such as sausages or meat loaves. Fat from the back of a pig (fatback). Bratwurst. Salted nuts and seeds. Canned beans with added salt. Canned or smoked fish. Whole eggs or egg yolks. Chicken or [place] with skin. Dairy Whole or 2% milk, cream, and half-and-half. Whole or full-fat cream cheese. Whole-fat or sweetened yogurt. Full-fat cheese. Nondairy creamers. Whipped toppings. Processed cheese and cheese spreads. Fats and oils Butter. Stick margarine. Lard. Shortening. Ghee. Bacon fat.  Tropical oils, such as coconut, palm kernel, or palm oil. Seasonings and condiments Onion salt, garlic salt, seasoned salt, table salt, and sea salt. Worcestershire sauce. Tartar sauce. Barbecue sauce. Teriyaki sauce. Soy sauce, including reduced-sodium. Steak sauce. Canned and packaged gravies. Fish sauce. Oyster sauce. Cocktail sauce. Store-bought horseradish. Ketchup. Mustard. Meat flavorings and tenderizers. Bouillon cubes. Hot sauces. Pre-made or packaged marinades. Pre-made or packaged taco seasonings. Relishes. Regular salad dressings. Other foods Salted popcorn and pretzels. The items listed above may not be a complete list  of foods and beverages you should avoid. Contact a dietitian for more information. Where to find more information  National Heart, Lung, and Blood Institute: https://wilson-eaton.com/  American Heart Association: www.heart.org  Academy of Nutrition and Dietetics: www.eatright.[place]: www.kidney.org Summary  The DASH eating plan is a healthy eating plan that has been shown to reduce high blood pressure (hypertension). It may also reduce your risk for type 2 diabetes, heart disease, and stroke.  When on the DASH eating plan, aim to eat more fresh fruits and vegetables, whole grains, lean proteins, low-fat dairy, and heart-healthy fats.  With the DASH eating plan, you should limit salt (sodium) intake to 2,300 mg a day. If you have hypertension, you may need to reduce your sodium intake to 1,500 mg a day.  Work with your health care provider or dietitian to adjust your eating plan to your individual calorie needs. This information is not intended to replace advice given to you by your health care provider. Make sure you discuss any questions you have with your health care provider. Document Revised: 08/15/2019 Document Reviewed: 08/15/2019 Elsevier Patient Education  2021 [employer].

## 2020-10-28 ENCOUNTER — Ambulatory Visit (INDEPENDENT_AMBULATORY_CARE_PROVIDER_SITE_OTHER): Payer: Medicare Other | Admitting: Family Medicine

## 2020-10-28 ENCOUNTER — Other Ambulatory Visit: Payer: Self-pay

## 2020-10-28 ENCOUNTER — Encounter: Payer: Self-pay | Admitting: Family Medicine

## 2020-10-28 VITALS — BP 134/76 | HR 63 | Temp 98.2°F | Ht 73.0 in | Wt 194.0 lb

## 2020-10-28 DIAGNOSIS — R911 Solitary pulmonary nodule: Secondary | ICD-10-CM | POA: Diagnosis not present

## 2020-10-28 DIAGNOSIS — Z0000 Encounter for general adult medical examination without abnormal findings: Secondary | ICD-10-CM

## 2020-10-28 DIAGNOSIS — K219 Gastro-esophageal reflux disease without esophagitis: Secondary | ICD-10-CM | POA: Diagnosis not present

## 2020-10-28 DIAGNOSIS — E785 Hyperlipidemia, unspecified: Secondary | ICD-10-CM | POA: Diagnosis not present

## 2020-10-28 DIAGNOSIS — Z87891 Personal history of nicotine dependence: Secondary | ICD-10-CM

## 2020-11-01 DIAGNOSIS — L820 Inflamed seborrheic keratosis: Secondary | ICD-10-CM | POA: Diagnosis not present

## 2020-11-01 DIAGNOSIS — L720 Epidermal cyst: Secondary | ICD-10-CM | POA: Diagnosis not present

## 2020-11-11 DIAGNOSIS — L821 Other seborrheic keratosis: Secondary | ICD-10-CM | POA: Diagnosis not present

## 2020-11-11 DIAGNOSIS — L720 Epidermal cyst: Secondary | ICD-10-CM | POA: Diagnosis not present

## 2020-12-03 ENCOUNTER — Other Ambulatory Visit: Payer: Medicare Other

## 2020-12-03 ENCOUNTER — Other Ambulatory Visit: Payer: Self-pay | Admitting: Family Medicine

## 2020-12-06 ENCOUNTER — Other Ambulatory Visit: Payer: Self-pay

## 2020-12-06 ENCOUNTER — Ambulatory Visit (INDEPENDENT_AMBULATORY_CARE_PROVIDER_SITE_OTHER)
Admission: RE | Admit: 2020-12-06 | Discharge: 2020-12-06 | Disposition: A | Discharge status: Home / Self Care | Payer: Medicare Other | Source: Ambulatory Visit | Attending: Family Medicine | Admitting: Family Medicine

## 2020-12-06 DIAGNOSIS — R911 Solitary pulmonary nodule: Secondary | ICD-10-CM | POA: Diagnosis not present

## 2020-12-06 DIAGNOSIS — I2510 Atherosclerotic heart disease of native coronary artery without angina pectoris: Secondary | ICD-10-CM | POA: Diagnosis not present

## 2020-12-06 DIAGNOSIS — I700 Atherosclerosis of aorta: Secondary | ICD-10-CM | POA: Diagnosis not present

## 2020-12-06 DIAGNOSIS — R918 Other nonspecific abnormal finding of lung field: Secondary | ICD-10-CM | POA: Diagnosis not present

## 2020-12-07 ENCOUNTER — Encounter: Payer: Self-pay | Admitting: Family Medicine

## 2020-12-07 DIAGNOSIS — I700 Atherosclerosis of aorta: Secondary | ICD-10-CM | POA: Insufficient documentation

## 2021-01-27 DIAGNOSIS — M1712 Unilateral primary osteoarthritis, left knee: Secondary | ICD-10-CM | POA: Diagnosis not present

## 2021-05-02 ENCOUNTER — Encounter: Payer: Self-pay | Admitting: Family Medicine

## 2021-05-04 ENCOUNTER — Other Ambulatory Visit: Payer: Self-pay

## 2021-05-04 MED ORDER — GABAPENTIN 300 MG PO CAPS: 300.0000 mg | ORAL_CAPSULE | Freq: Three times a day (TID) | ORAL | 5 refills | Status: DC

## 2021-07-26 ENCOUNTER — Other Ambulatory Visit: Payer: Self-pay

## 2021-07-26 ENCOUNTER — Telehealth: Payer: Self-pay

## 2021-07-26 ENCOUNTER — Ambulatory Visit (INDEPENDENT_AMBULATORY_CARE_PROVIDER_SITE_OTHER): Payer: Medicare Other | Admitting: *Deleted

## 2021-07-26 DIAGNOSIS — Z23 Encounter for immunization: Secondary | ICD-10-CM

## 2021-07-26 NOTE — Telephone Encounter (Signed)
If he has not had COVID within 3 months or new booster (and last vaccination over 2 months ago) would recommend this  Does he need a sooner visit than February or is he satisfied with that?

## 2021-07-26 NOTE — Telephone Encounter (Signed)
Patient came into the office today for an appointment and was curious if Dr.[name] recommends him getting a booster shot.

## 2021-07-27 NOTE — Telephone Encounter (Signed)
Lft VM to rtn call. Dm/cma  

## 2021-07-27 NOTE — Telephone Encounter (Signed)
Patient notified VIA phone and is okay with the appointment he has.  Dm/cma

## 2021-09-05 ENCOUNTER — Encounter: Payer: Self-pay | Admitting: Family Medicine

## 2021-10-05 DIAGNOSIS — L718 Other rosacea: Secondary | ICD-10-CM | POA: Diagnosis not present

## 2021-10-24 NOTE — [place] Notes (Signed)
Phone: [phone]   Subjective:  Patient presents today for their annual physical. Chief complaint-noted.   See problem oriented charting- ROS- full  review of systems was completed and negative  except for: Some hearing loss-ENT referral was placed, seasonal allergies with sinus pressure at times related to this, urinary urgency stable from last year, knee issues-has done better since aspirations, folliculitis being treated by dermatology with metronidazole and ampicillin, ongoing issues with sleep-no significant worsening  The following were reviewed and entered/updated in epic: Past Medical History:  Diagnosis Date   Chronic anxiety    Chronic rhinitis    Claustrophobia    Diverticulosis of colon (without mention of hemorrhage)    DJD (degenerative joint disease)    OA AND PAIN RIGHT KNEE. 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   TOS (thoracic outlet syndrome)    Saw a physical therapist--PAIN RIGHT SHOULDER- MUCH IMPROVED   Patient Active Problem List   Diagnosis Date Noted   Aortic atherosclerosis ([place]) 12/07/2020    Priority: Medium    Insomnia 07/18/2015    Priority: Medium    Hyperlipidemia LDL goal <130 09/16/2008    Priority: Medium    Chronic rhinitis 09/16/2008    Priority: Medium    Former smoker 03/26/2017    Priority: Low   GERD (gastroesophageal reflux disease) 03/26/2017    Priority: Low   Allergic rhinitis 03/26/2017    Priority: Low   Cervical spondylosis with radiculopathy 11/30/2015    Priority: Low   TOS (thoracic outlet syndrome)     Priority: Low   Cough 10/27/2008    Priority: Low   OA (osteoarthritis) of knee 09/16/2008    Priority: Low   VERTIGO 09/16/2008    Priority: Low   S/P total knee arthroplasty, 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   S/P total knee arthroplasty 08/25/2019   Past Surgical History:  Procedure Laterality Date   ANTERIOR CERVICAL DECOMP/DISCECTOMY FUSION N/A 11/30/2015   Procedure: CERVICAL SEVEN-THORACIC ONE ANTERIOR CERVICAL DISCECTOMY/FUSION;  Surgeon: Earnie Larsson, MD;  Location: [facility];  Service: Neurosurgery;  Laterality: N/A;   CERVICAL DISC SURGERY  2009   c3-4 5-6/ by Dr Trenton Gammon- FUSION   COLONOSCOPY     TONSILLECTOMY     AS A CHILD   TOTAL KNEE ARTHROPLASTY Right 07/21/2014   Procedure: TOTAL RIGHT KNEE ARTHROPLASTY;  Surgeon: Mauri Pole, MD;  Location: [facility];  Service: Orthopedics;  Laterality: Right;   TOTAL KNEE ARTHROPLASTY Left 08/25/2019   Procedure: TOTAL KNEE ARTHROPLASTY;  Surgeon: Gaynelle Arabian, MD;  Location: [facility];  Service: Orthopedics;  Laterality: Left;  4mn    Family History  Problem Relation Age of Onset   Other Mother        868 "old age". long health in grandparents as well   Other Father        967 "old age"   Colon polyps Father        adenomas   Healthy Sister    Colon cancer Paternal Aunt     Medications- reviewed and updated Current Outpatient Medications  Medication Sig Dispense Refill   gabapentin (NEURONTIN) 300 MG capsule Take 1 capsule (300 mg total) by mouth 3 (three) times daily. 30 capsule 5  Melatonin 10 MG TABS Take 10 mg by mouth at bedtime.     rosuvastatin (CRESTOR) 20 MG tablet TAKE 1 TABLET ONE TIME PER WEEK 13 tablet 3   ampicillin (PRINCIPEN) 500 MG capsule Take 500 mg by mouth 2 (two) times daily.     metroNIDAZOLE (METROGEL) 0.75 % gel SMARTSIG:1 Sparingly Topical Twice Daily     No current facility-administered medications for this visit.    Allergies-reviewed and updated Allergies  Allergen Reactions   Tamsulosin Anxiety    Social History   Social History Narrative   Married 1982. 1 daughter. No grandkids yet (daughter married 2018)      Retired from [employer] years. [employer] from [place] to 1970  while in college.    Went to [employer] in [employer].    Goes to [employer].       Hobbies: golf, work out at [employer] at [place], place up at [employer].    Objective  Objective:  BP (!) 148/80    Pulse (!) 56    Temp 97.9 F (36.6 C)    Ht '6\' 1"'  (1.854 m)    Wt 194 lb 9.6 oz (88.3 kg)    SpO2 99%    BMI 25.67 kg/m  Gen: NAD, resting comfortably HEENT: Mucous membranes are moist. Oropharynx normal Neck: no thyromegaly CV: RRR no murmurs rubs or gallops Lungs: CTAB no crackles, wheeze, rhonchi Abdomen: soft/nontender/nondistended/normal bowel sounds. No rebound or guarding.  Ext: no edema Skin: warm, dry Neuro: grossly normal, moves all extremities, PERRLA   Assessment and Plan  76 y.o. male presenting for annual physical.  Health Maintenance counseling: 1. Anticipatory guidance: Patient counseled regarding regular dental exams-q6 months, eye exams -yearly,  avoiding smoking and second hand smoke , limiting alcohol to 2 beverages per day .  No illicit drugs 2. Risk factor reduction:  Advised patient of need for regular exercise and diet rich and fruits and vegetables to reduce risk of heart attack and stroke.  Exercise- 02 fitness 3 times a week-congratulated efforts!  We discussed max heart rate goals and slowing down if notes elevation Diet/weight management-reasonably healthy diet.  Weight stable over the last year.  Wt Readings from Last 3 Encounters:  10/31/21 194 lb 9.6 oz (88.3 kg)  10/28/20 194 lb (88 kg)  09/22/20 196 lb 3.4 oz (89 kg)   3. Immunizations/screenings/ancillary studies-fully up-to-date-reports Pfizer booster 09/20/2021-available on outside records Immunization History  Administered Date(s) Administered   Fluad Quad(high Dose 65+) 07/14/2019, 07/26/2021   Influenza Split 07/26/2011, 07/02/2015   Influenza Whole 06/25/2012   Influenza, High Dose Seasonal PF 07/19/2017   Influenza,inj,Quad PF,6+ Mos 07/08/2013, 07/20/2014    Influenza,inj,quad, With Preservative 07/16/2019   Influenza-Unspecified 07/04/2018, 05/31/2020   Moderna Sars-Covid-2 Vaccination 02/04/2020, 03/03/2020   Pneumococcal Conjugate-13 07/16/2015   Pneumococcal Polysaccharide-23 06/25/2006, 11/10/2010   Td 11/23/2004   Tdap 07/16/2015   Zoster Recombinat (Shingrix) 07/02/2017, 09/03/2017  4. Prostate cancer screening-  low risk prior psa trend, past age based screening recs. Urinary urgency stable-he opts out of PSA recheck today Lab Results  Component Value Date   PSA 0.630 06/18/2019   PSA 0.54 11/10/2010   PSA 0.75 08/05/2009   5. Colon cancer screening - normal 03/2013 with Dr. Alanson Aly and was told no recall due to age. Could consider stool cards at 2024-discussed next year- occasional hemorrhoid bleeding- offered referral or testing this year- he declines 58. Skin cancer screening- - [place] derm dr. Jeanie Cooks recently being  seen for folliculitis which is improving.  Advised regular sunscreen use.  Outside of folliculitis-denies worrisome, changing, or new skin lesions.  7. Smoking associated screening (lung cancer screening, AAA screen 65-75, UA)- Former smoker- 2.5 pack years quit in 1970s . aaa screen negative 04/26/17 8. STD screening - only active with wife  Status of chronic or acute concerns   #Social update-will have second granddaughter-will be named Pacey! 1st granddaughter march 2021 I believe- Thora Lance I believe  #left knee swelling after nov 2020 L knee replacement. Has had aspiration but fluid returned. We did some labs for Dr. Elmyra Ricks with CRP and ESR but were reassuring. Uric acid also normal.  Has required arthrocentesis for swelling but has done better since that time  #hyperlipidemia- peak LDL 143 so target at least LDL 100 S: Medication: Rosuvastatin 20Mg once a week Lab Results  Component Value Date   CHOL 166 09/03/2020   HDL 50 09/03/2020   LDLCALC 101 (H) 09/03/2020   LDLDIRECT 122.0 10/23/2019   TRIG  60 09/03/2020   CHOLHDL 3.3 09/03/2020   A/P: We discussed with aortic atherosclerosis (suspect stable) noted as well as calcium on coronary arteries based on CT and coronary artery calcium scoring long-term targeting under 70-may adjust if needed after labs today  #pulmonary nodules- two small nodules on coronary calcium scoring 11/19/2018 with note to check CT in 12 months if high risk or no recheck if not high risk. I considered smoking history quitting in 1970s and under 3 pack years lower risk. We discussed checking CT- he opts in 10/28/20-no further repeat needed after this  #Hearing loss-referral to ENT was placed  #Elevated blood pressure reading-previously when he has monitored his home blood pressures been well controlled-we discussed "Please monitor home blood pressure for 2 weeks and send me your numbers. Goal less than 135/85 at home."  Recommended follow up: Return in about 1 year (around 10/31/2022) for physical or sooner if needed. Future Appointments  Date Time Provider [place]  11/02/2022 10:40 AM Marin Olp, MD [facility]   Lab/Order associations: fasting   ICD-10-CM   1. Hyperlipidemia LDL goal <130  E78.5 CBC with Differential/Platelet    Comprehensive metabolic panel    Lipid panel    2. Preventative health care  Z00.00     3. Pulmonary nodule  R91.1     4. Decreased hearing, unspecified laterality  H91.90 Ambulatory referral to ENT    5. Aortic atherosclerosis ([place])  I70.0      I,Jada Bradford,acting as a scribe for Garret Reddish, MD.,have documented all relevant documentation on the behalf of Garret Reddish, MD,as directed by  Garret Reddish, MD while in the presence of Garret Reddish, MD.   I, Garret Reddish, MD, have reviewed all documentation for this visit. The documentation on 10/31/21 for the exam, diagnosis, procedures, and orders are all accurate and complete.   Return precautions advised.  Garret Reddish, MD

## 2021-10-31 ENCOUNTER — Encounter: Payer: Self-pay | Admitting: Family Medicine

## 2021-10-31 ENCOUNTER — Other Ambulatory Visit: Payer: Self-pay

## 2021-10-31 ENCOUNTER — Ambulatory Visit (INDEPENDENT_AMBULATORY_CARE_PROVIDER_SITE_OTHER): Payer: Medicare Other | Admitting: Family Medicine

## 2021-10-31 VITALS — BP 148/80 | HR 56 | Temp 97.9°F | Ht 73.0 in | Wt 194.6 lb

## 2021-10-31 DIAGNOSIS — E785 Hyperlipidemia, unspecified: Secondary | ICD-10-CM | POA: Diagnosis not present

## 2021-10-31 DIAGNOSIS — Z0000 Encounter for general adult medical examination without abnormal findings: Secondary | ICD-10-CM | POA: Diagnosis not present

## 2021-10-31 DIAGNOSIS — R911 Solitary pulmonary nodule: Secondary | ICD-10-CM

## 2021-10-31 DIAGNOSIS — I700 Atherosclerosis of aorta: Secondary | ICD-10-CM

## 2021-10-31 DIAGNOSIS — H9190 Unspecified hearing loss, unspecified ear: Secondary | ICD-10-CM | POA: Diagnosis not present

## 2021-10-31 LAB — COMPREHENSIVE METABOLIC PANEL
ALT: 18 U/L (ref 0–53)
AST: 19 U/L (ref 0–37)
Albumin: 4.5 g/dL (ref 3.5–5.2)
Alkaline Phosphatase: 56 U/L (ref 39–117)
BUN: 19 mg/dL (ref 6–23)
CO2: 31 mEq/L (ref 19–32)
Calcium: 9 mg/dL (ref 8.4–10.5)
Chloride: 103 mEq/L (ref 96–112)
Creatinine, Ser: 1.26 mg/dL (ref 0.40–1.50)
GFR: 55.49 mL/min — ABNORMAL LOW (ref >60.00)
Glucose, Bld: 90 mg/dL (ref 70–99)
Potassium: 4.4 mEq/L (ref 3.5–5.1)
Sodium: 140 mEq/L (ref 135–145)
Total Bilirubin: 1.1 mg/dL (ref 0.2–1.2)
Total Protein: 7.7 g/dL (ref 6.0–8.3)

## 2021-10-31 LAB — CBC WITH DIFFERENTIAL/PLATELET
Basophils Absolute: 0 10*3/uL (ref 0.0–0.1)
Basophils Relative: 0.6 % (ref 0.0–3.0)
Eosinophils Absolute: 0.2 10*3/uL (ref 0.0–0.7)
Eosinophils Relative: 4.3 % (ref 0.0–5.0)
HCT: 44.1 % (ref 39.0–52.0)
Hemoglobin: 14.7 g/dL (ref 13.0–17.0)
Lymphocytes Relative: 32.6 % (ref 12.0–46.0)
Lymphs Abs: 1.8 10*3/uL (ref 0.7–4.0)
MCHC: 33.4 g/dL (ref 30.0–36.0)
MCV: 91.3 fl (ref 78.0–100.0)
Monocytes Absolute: 0.6 10*3/uL (ref 0.1–1.0)
Monocytes Relative: 11.2 % (ref 3.0–12.0)
Neutro Abs: 2.8 10*3/uL (ref 1.4–7.7)
Neutrophils Relative %: 51.3 % (ref 43.0–77.0)
Platelets: 237 10*3/uL (ref 150.0–400.0)
RBC: 4.83 Mil/uL (ref 4.22–5.81)
RDW: 14 % (ref 11.5–15.5)
WBC: 5.5 10*3/uL (ref 4.0–10.5)

## 2021-10-31 LAB — LIPID PANEL
Cholesterol: 163 mg/dL (ref 0–200)
HDL: 45.1 mg/dL (ref >39.00)
LDL Cholesterol: 100 mg/dL — ABNORMAL HIGH (ref 0–99)
NonHDL: 117.47
Total CHOL/HDL Ratio: 4
Triglycerides: 86 mg/dL (ref 0.0–149.0)
VLDL: 17.2 mg/dL (ref 0.0–40.0)

## 2021-10-31 NOTE — Patient Instructions (Signed)
Please stop by lab before you go If you have mychart- we will send your results within 3 business days of [place] receiving them.  If you do not have mychart- we will call you about results within 5 business days of [place] receiving them.  *please also note that you will see labs on mychart as soon as they post. I will later go in and write notes on them- will say "notes from Dr. Yong Channel"     Schedule one year physical before you leave.  We will call you within two weeks about your referral to ENT. If you do not hear within 2 weeks, give [place] a call.   Please monitor home blood pressure for 2 weeks and send me your numbers. Goal less than 135/85 at home.

## 2021-11-01 ENCOUNTER — Other Ambulatory Visit: Payer: Self-pay

## 2021-11-01 ENCOUNTER — Other Ambulatory Visit: Payer: Self-pay | Admitting: Family Medicine

## 2021-11-01 ENCOUNTER — Encounter: Payer: Self-pay | Admitting: Family Medicine

## 2021-11-01 MED ORDER — ROSUVASTATIN CALCIUM 20 MG PO TABS: 20.0000 mg | ORAL_TABLET | ORAL | 3 refills | Status: DC

## 2021-11-02 NOTE — Telephone Encounter (Signed)
Ok se place an ENT referral ?

## 2021-12-10 ENCOUNTER — Other Ambulatory Visit: Payer: Self-pay | Admitting: Family Medicine

## 2022-01-16 ENCOUNTER — Encounter: Payer: Self-pay | Admitting: Family

## 2022-01-16 ENCOUNTER — Ambulatory Visit (INDEPENDENT_AMBULATORY_CARE_PROVIDER_SITE_OTHER): Payer: Medicare Other | Admitting: Family

## 2022-01-16 VITALS — BP 157/76 | HR 60 | Temp 98.3°F | Ht 73.0 in | Wt 191.4 lb

## 2022-01-16 DIAGNOSIS — J301 Allergic rhinitis due to pollen: Secondary | ICD-10-CM | POA: Diagnosis not present

## 2022-01-16 MED ORDER — METHYLPREDNISOLONE ACETATE 40 MG/ML IJ SUSP
60.0000 mg | Freq: Once | INTRAMUSCULAR | Status: AC
  Administered 2022-01-16: 60 mg via INTRAMUSCULAR

## 2022-01-16 MED ORDER — CHERATUSSIN AC 100-10 MG/5ML PO SYRP: 10.0000 mL | ORAL_SOLUTION | Freq: Every evening | ORAL | 0 refills | Status: DC | PRN

## 2022-01-16 NOTE — Patient Instructions (Signed)
It was very nice to see you today! ? ?I have sent over a cough syrup to take at night, but your cough will not go away if you don't control the symptoms using the below meds: ? ?Continue Flonase 1 spray each nostril, but increase to TWICE a day, 2 sprays twice a day ok for bad symptoms for 1-2 weeks, then reduce to 1 spray twice a day. ?Nasal saline spray (i.e., Simply Saline) or nasal saline lavage (i.e., NeilMed) is recommended as needed and prior to medicated nasal sprays. ?May continue over the counter antihistamines such as Xyzal (levocetirizine) or Zyrtec daily as needed. May take twice a day if needed as long as it does not cause drowsiness. ? ? ? ? ?PLEASE NOTE: ? ?If you had any lab tests please let us know if you have not heard back within a few days. You may see your results on MyChart before we have a chance to review them but we will give you a call once they are reviewed by [place]. If we ordered any referrals today, please let us know if you have not heard from their office within the next week.  ? ?Please try these tips to maintain a healthy lifestyle: ? ?Eat most of your calories during the day when you are active. Eliminate processed foods including packaged sweets (pies, cakes, cookies), reduce intake of potatoes, white bread, white pasta, and white rice. Look for whole grain options, oat flour or almond flour. ? ?Each meal should contain half fruits/vegetables, one quarter protein, and one quarter carbs (no bigger than a computer mouse). ? ?Cut down on sweet beverages. This includes juice, soda, and sweet tea. Also watch fruit intake, though this is a healthier sweet option, it still contains natural sugar! Limit to 3 servings daily. ? ?Drink at least 1 glass of water with each meal and aim for at least 8 glasses per day ? ?Exercise at least 150 minutes every week.  ? ?

## 2022-01-16 NOTE — Progress Notes (Signed)
? ?Subjective:  ? ? ? Patient ID: Joshua [name], male    DOB: 04-Aug-1946, 76 y.o.   MRN: [id number] ? ?Chief Complaint  ?Patient presents with  ? Sinus Problem  ?  Pt c/o cough, sneezing and nasal congestion. Has tried delsom cough and Advil cold and sinus but doesn't really help.   ? ?HPI:   ?Sinusitis: Patient complains of congestion, nasal congestion, post nasal drip, productive cough with  clear colored sputum, sinus pressure, and sneezing, with no fever, chills, night sweats or weight loss. Onset of symptoms was 2 weeks ago, unchanged since that time. He is drinking moderate amounts of fluids.  Past history is significant for no history of pneumonia or bronchitis. Patient is non-smoker. sx for 2 weeks.  ? ?There are no preventive care reminders to display for this patient. ? ?Past Medical History:  ?Diagnosis Date  ? Chronic anxiety   ? Chronic rhinitis   ? Claustrophobia   ? Diverticulosis of colon (without mention of hemorrhage)   ? DJD (degenerative joint disease)   ? 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 GERD (gastroesophageal reflux disease)   ? ONLY WITH CERTAIN FOODS  ? History of total right knee replacement   ? Hx of vertigo   ? Hyperlipidemia   ? Hypertension   ? PTSD (post-traumatic stress disorder)   ? [place] VETERAN  ? TOS (thoracic outlet syndrome)   ? Saw a physical therapist--PAIN RIGHT SHOULDER- MUCH IMPROVED  ? ? ?Past Surgical History:  ?Procedure Laterality Date  ? ANTERIOR CERVICAL DECOMP/DISCECTOMY FUSION N/A 11/30/2015  ? Procedure: CERVICAL SEVEN-THORACIC ONE ANTERIOR CERVICAL DISCECTOMY/FUSION;  Surgeon: Earnie Larsson, MD;  Location: [facility];  Service: Neurosurgery;  Laterality: N/A;  ? [place] SURGERY  2009  ? c3-4 5-6/ by Dr Trenton Gammon- FUSION  ? COLONOSCOPY    ? TONSILLECTOMY    ? AS A CHILD  ? TOTAL KNEE ARTHROPLASTY Right 07/21/2014  ? Procedure: TOTAL RIGHT KNEE ARTHROPLASTY;  Surgeon: Mauri Pole, MD;  Location: [facility];  Service: Orthopedics;   Laterality: Right;  ? TOTAL KNEE ARTHROPLASTY Left 08/25/2019  ? Procedure: TOTAL KNEE ARTHROPLASTY;  Surgeon: Gaynelle Arabian, MD;  Location: [facility];  Service: Orthopedics;  Laterality: Left;  56mn  ? ? ?Outpatient Medications Prior to Visit  ?Medication Sig Dispense Refill  ? ampicillin (PRINCIPEN) 500 MG capsule Take 500 mg by mouth 2 (two) times daily.    ? gabapentin (NEURONTIN) 300 MG capsule Take 1 capsule (300 mg total) by mouth 3 (three) times daily. 30 capsule 5  ? Melatonin 10 MG TABS Take 10 mg by mouth at bedtime.    ? metroNIDAZOLE (METROGEL) 0.75 % gel SMARTSIG:1 Sparingly Topical Twice Daily    ? rosuvastatin (CRESTOR) 20 MG tablet TAKE 1 TABLET ONE TIME PER WEEK 13 tablet 3  ? ?No facility-administered medications prior to visit.  ? ? ?Allergies  ?Allergen Reactions  ? Tamsulosin Anxiety  ? ? ? ?   ?Objective:  ?  ?Physical Exam ?Vitals and nursing note reviewed.  ?Constitutional:   ?   General: He is not in acute distress. ?   Appearance: Normal appearance.  ?HENT:  ?   Head: Normocephalic.  ?   Right Ear: Tympanic membrane and ear canal normal.  ?   Left Ear: Tympanic membrane and ear canal normal.  ?   Nose:  ?   Right Sinus: Frontal sinus tenderness present.  ?   Left Sinus:  Frontal sinus tenderness present.  ?   Mouth/Throat:  ?   Pharynx: Oropharyngeal exudate present. No pharyngeal swelling, posterior oropharyngeal erythema or uvula swelling.  ?Cardiovascular:  ?   Rate and Rhythm: Normal rate and regular rhythm.  ?Pulmonary:  ?   Effort: Pulmonary effort is normal.  ?   Breath sounds: Normal breath sounds.  ?Musculoskeletal:     ?   General: Normal range of motion.  ?   Cervical back: Normal range of motion.  ?Skin: ?   General: Skin is warm and dry.  ?Neurological:  ?   Mental Status: He is alert and oriented to person, place, and time.  ?Psychiatric:     ?   Mood and Affect: Mood normal.  ? ? ?BP (!) 157/76 (BP Location: Left Arm, Patient Position: Sitting, Cuff Size: Large)   Pulse 60    Temp 98.3 ?F (36.8 ?C) (Temporal)   Ht '6\' 1"'$  (1.854 m)   Wt 191 lb 6 oz (86.8 kg)   SpO2 97%   BMI 25.25 kg/m?  ?Wt Readings from Last 3 Encounters:  ?01/16/22 191 lb 6 oz (86.8 kg)  ?10/31/21 194 lb 9.6 oz (88.3 kg)  ?10/28/20 194 lb (88 kg)  ? ? ?   ?Assessment & Plan:  ? ?Problem List Items Addressed This Visit   ? ?  ? Respiratory  ? Allergic rhinitis - Primary  ? pt using Flonase qam, advised to increase to bid, continue Zyrtec, stop advil cold & sinus. Sending cough syrup, advised on use & SE. ? ?Relevant Medications  ? methylPREDNISolone acetate (DEPO-MEDROL) injection 60 mg  ? guaiFENesin-codeine (CHERATUSSIN AC) 100-10 MG/5ML syrup  ? ? ?Meds ordered this encounter  ?Medications  ? DISCONTD: guaiFENesin-codeine (CHERATUSSIN AC) 100-10 MG/5ML syrup  ?  Sig: Take 10 mLs by mouth at bedtime as needed for cough or congestion.  ?  Dispense:  120 mL  ?  Refill:  0  ?  Order Specific Question:   Supervising Provider  ?  Answer:   ANDY, CAMILLE L [[id number]]  ? methylPREDNISolone acetate (DEPO-MEDROL) injection 60 mg  ? guaiFENesin-codeine (CHERATUSSIN AC) 100-10 MG/5ML syrup  ?  Sig: Take 10 mLs by mouth at bedtime as needed for cough or congestion.  ?  Dispense:  120 mL  ?  Refill:  0  ?  Order Specific Question:   Supervising Provider  ?  Answer:   ANDY, CAMILLE L [[id number]]  ? ? ?Jeanie Sewer, NP ? ?

## 2022-01-17 DIAGNOSIS — H903 Sensorineural hearing loss, bilateral: Secondary | ICD-10-CM | POA: Diagnosis not present

## 2022-01-17 DIAGNOSIS — H9313 Tinnitus, bilateral: Secondary | ICD-10-CM | POA: Diagnosis not present

## 2022-01-26 IMAGING — CT CT CHEST W/O CM
2 of 4 series · 15 of 36 positions shown, 18 images · non-contrast
Comparison: CT dated 11/19/2018

CLINICAL DATA: Routine follow-up for pulmonary nodule on prior
calcium scoring study done 11/14/2018. The patient contracted COVID
in August 2020

EXAM:
CT CHEST WITHOUT CONTRAST
TECHNIQUE: Multidetector CT imaging of the chest was performed following the
standard protocol without IV contrast.

[Series 2: thorax · axial · 0.75mm/px · z∈[-334,-48]mm · 12 of 170 slices shown, 15 images]
[im 14/170  mediastinal]
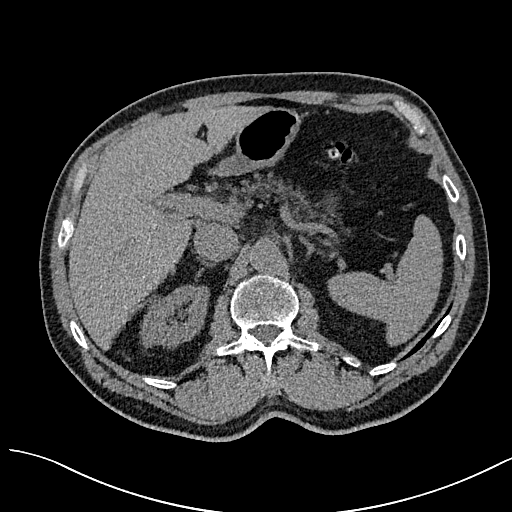
[im 14/170  lung]
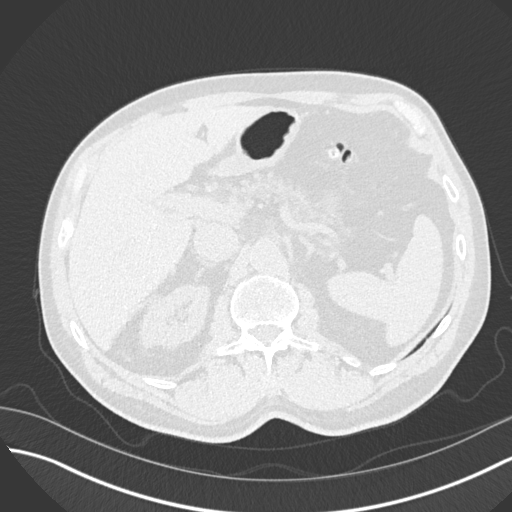
[im 27/170  lung]
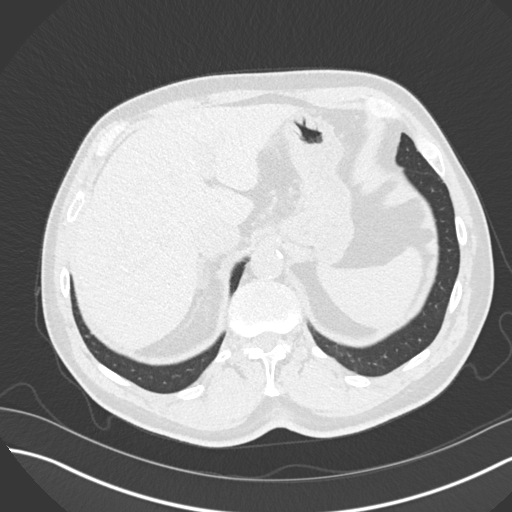
[im 40/170  lung]
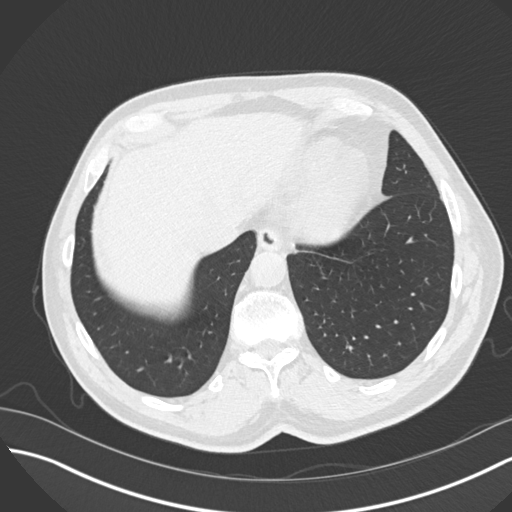
[im 53/170  lung]
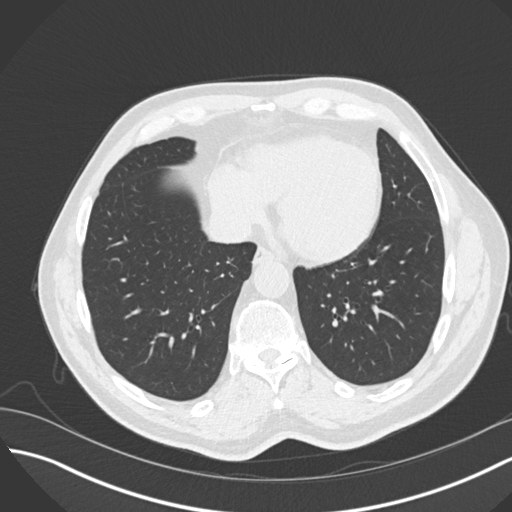
[im 66/170  mediastinal]
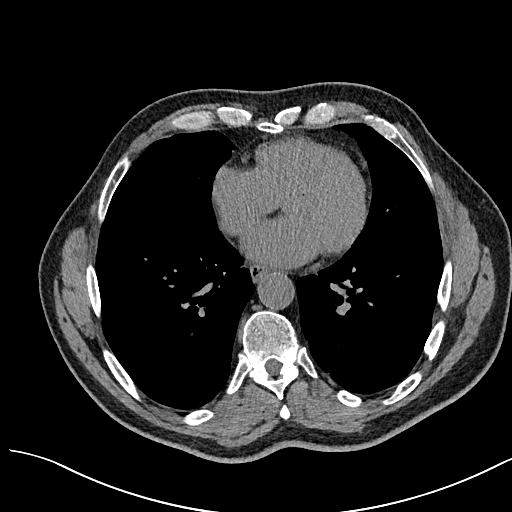
[im 66/170  lung]
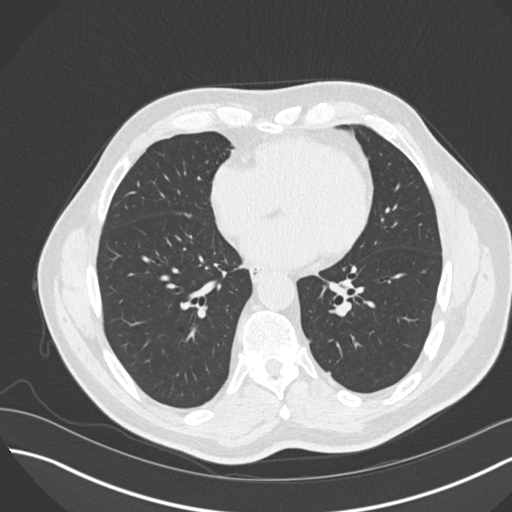
[im 79/170  lung]
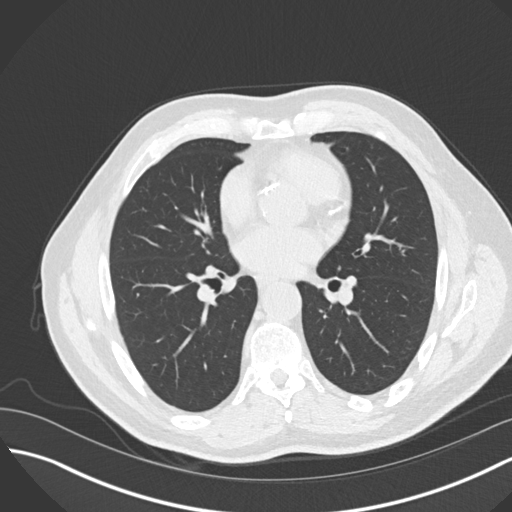
[im 92/170  lung]
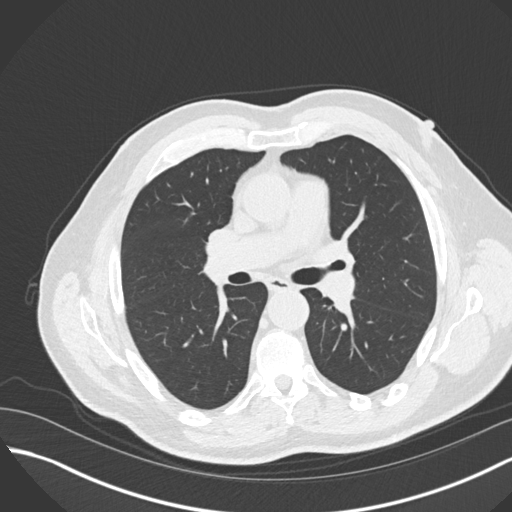
[im 105/170  lung]
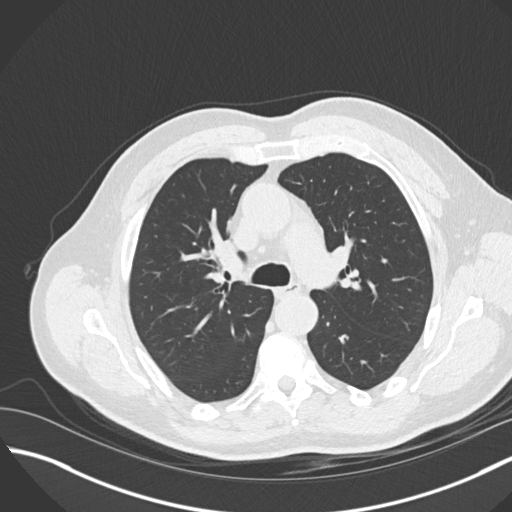
[im 118/170  mediastinal]
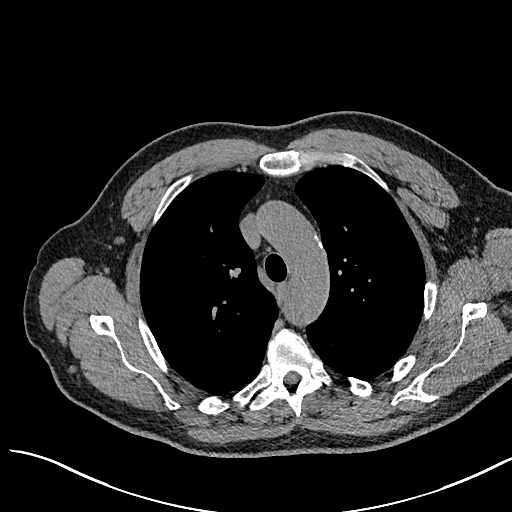
[im 118/170  lung]
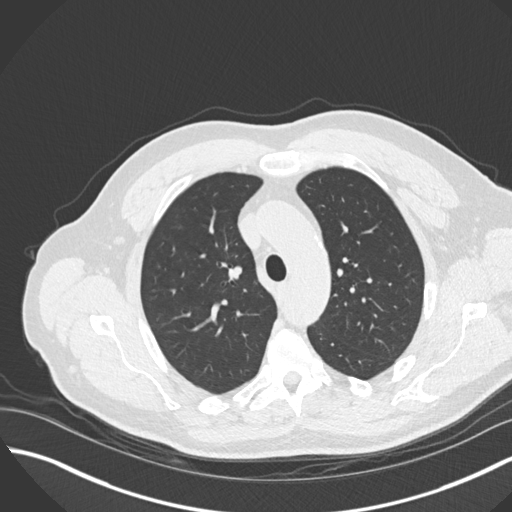
[im 131/170  lung]
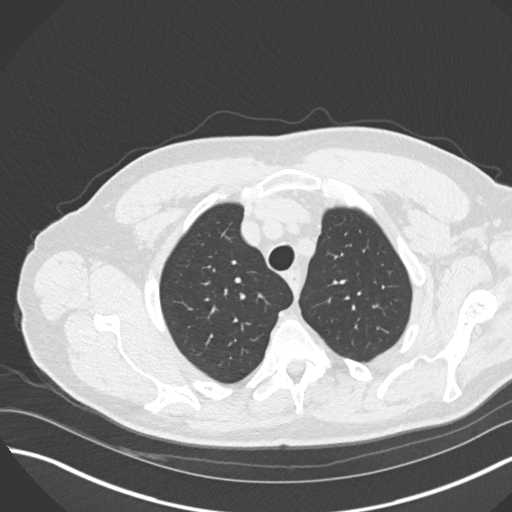
[im 144/170  lung]
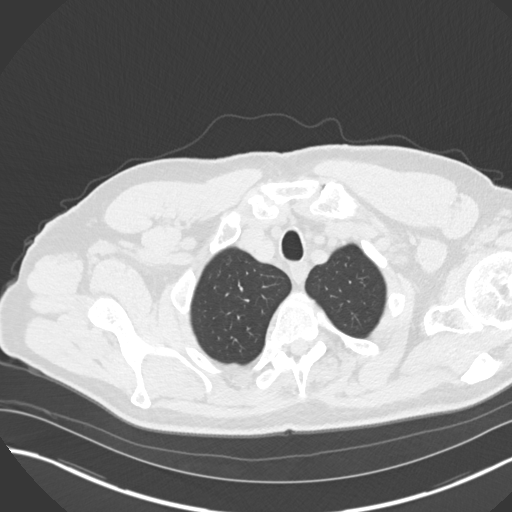
[im 157/170  lung]
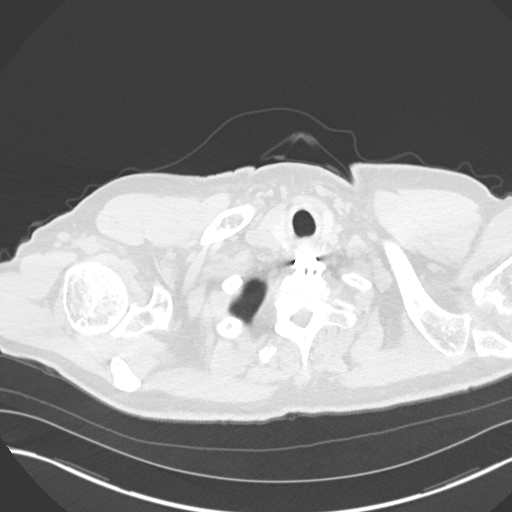

[Series 5: coronal · coronal · 0.66mm/px · 3 of 133 slices shown]
[im 27/133  lung]
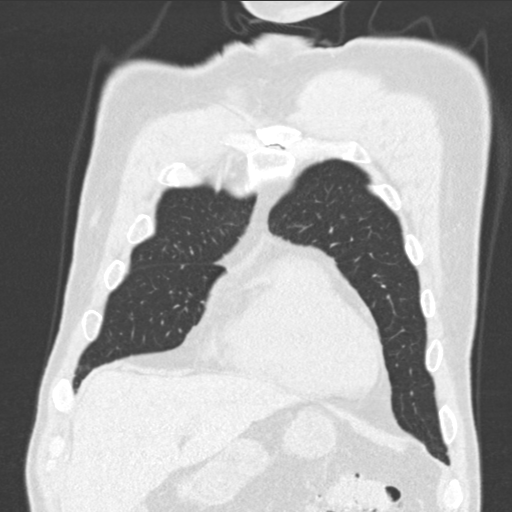
[im 53/133  lung]
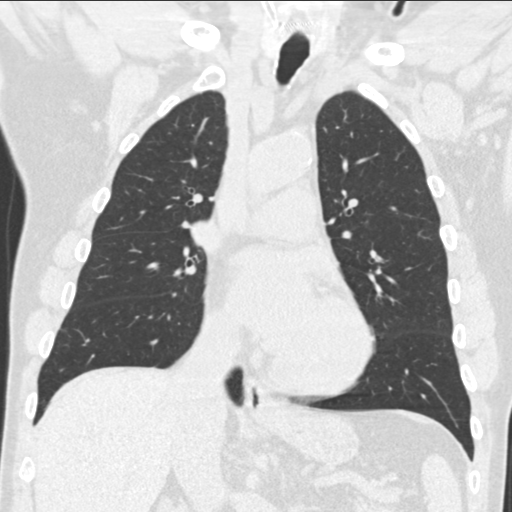
[im 80/133  lung]
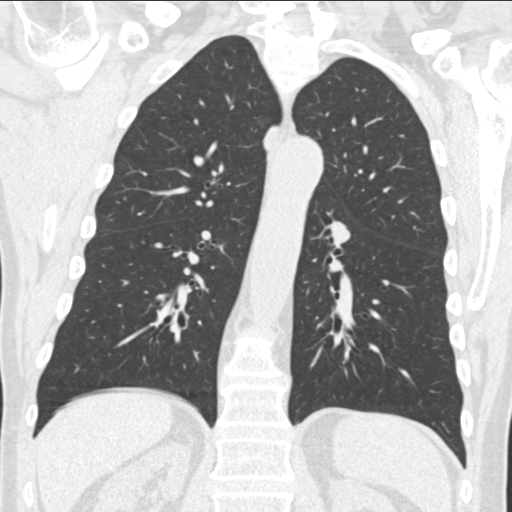

[15 of 36 positions shown; findings below may reference images not displayed]

FINDINGS: Cardiovascular: The heart size is essentially stable. There are
atherosclerotic changes of the thoracic aorta without evidence for
an aneurysm. There are atherosclerotic changes of the coronary
arteries. There is no significant pericardial effusion.

Mediastinum/Nodes:

-- No mediastinal lymphadenopathy.

-- No hilar lymphadenopathy.

-- No axillary lymphadenopathy.

-- No supraclavicular lymphadenopathy.

-- Normal thyroid gland where visualized.

-  Unremarkable esophagus.

Lungs/Pleura: Again noted is a tiny pulmonary nodule in the right
middle lobe, stable to decreased in size from prior study. There is
an unchanged ill-defined pulmonary nodule at the left lung base in
addition to the right lung base (axial series 3, image 143 and 152).

Upper Abdomen: There is no acute abnormality detected in the upper
abdomen.

Musculoskeletal: No chest wall abnormality. No bony spinal canal
stenosis.
IMPRESSION: 1. No acute abnormality.
2. Stable small bilateral pulmonary nodules. No further follow-up is
required.
3. Coronary artery disease.

Aortic Atherosclerosis ([id number]).

## 2022-02-16 ENCOUNTER — Telehealth: Payer: Self-pay | Admitting: Family Medicine

## 2022-02-16 NOTE — Telephone Encounter (Signed)
Copied from [place]. Topic: Medicare AWV >> Feb 16, 2022  1:20 PM Harris-Coley, Hannah Beat wrote: Reason for CRM: Left message for patient to schedule Annual Wellness Visit.  Please schedule with Nurse Health Advisor Charlott Rakes, RN at [facility].  Please call [phone] ask for [facility]

## 2022-06-19 ENCOUNTER — Encounter: Payer: Self-pay | Admitting: *Deleted

## 2022-07-13 ENCOUNTER — Ambulatory Visit (INDEPENDENT_AMBULATORY_CARE_PROVIDER_SITE_OTHER): Payer: Medicare Other

## 2022-07-13 DIAGNOSIS — Z23 Encounter for immunization: Secondary | ICD-10-CM | POA: Diagnosis not present

## 2022-07-16 ENCOUNTER — Other Ambulatory Visit: Payer: Self-pay | Admitting: Family

## 2022-07-16 DIAGNOSIS — J301 Allergic rhinitis due to pollen: Secondary | ICD-10-CM

## 2022-07-18 ENCOUNTER — Other Ambulatory Visit: Payer: Self-pay | Admitting: Family

## 2022-07-18 DIAGNOSIS — J301 Allergic rhinitis due to pollen: Secondary | ICD-10-CM

## 2022-07-18 NOTE — Telephone Encounter (Signed)
Has not had recent office visit for respiratory issues/cough unless I am overlooking this-would recommend scheduling a visit

## 2022-07-24 ENCOUNTER — Encounter: Payer: Self-pay | Admitting: Physician Assistant

## 2022-07-24 ENCOUNTER — Ambulatory Visit (INDEPENDENT_AMBULATORY_CARE_PROVIDER_SITE_OTHER): Payer: Medicare Other | Admitting: Physician Assistant

## 2022-07-24 VITALS — BP 156/82 | HR 58 | Temp 97.5°F | Ht 73.0 in | Wt 188.8 lb

## 2022-07-24 DIAGNOSIS — R051 Acute cough: Secondary | ICD-10-CM

## 2022-07-24 DIAGNOSIS — R0981 Nasal congestion: Secondary | ICD-10-CM

## 2022-07-24 MED ORDER — CHERATUSSIN AC 100-10 MG/5ML PO SOLN: 5.0000 mL | Freq: Three times a day (TID) | ORAL | 0 refills | Status: DC | PRN

## 2022-07-24 MED ORDER — METHYLPREDNISOLONE ACETATE 80 MG/ML IJ SUSP
80.0000 mg | Freq: Once | INTRAMUSCULAR | Status: AC
  Administered 2022-07-24: 80 mg via INTRAMUSCULAR

## 2022-07-24 NOTE — Progress Notes (Signed)
Subjective:    Patient ID: Joshua [name], male    DOB: January 16, 1946, 76 y.o.   MRN: [id number]  Chief Complaint  Patient presents with   Cough    Pt c/o cough and congestion, not tested for Covid but no fever; has been feeling thi way since getting flu shot a few weeks ago, dry cough that was getting better but came back now productive; been taking Mucinex OTC but not helping; no other symptoms. Pt states he does have horrible allergies typically as well.     HPI Patient is in today for acute sick symptoms as described in [place] with nurse.  Taking Mucinex and Claritin OTC without relief. Using Flonase as well.   Past Medical History:  Diagnosis Date   Chronic anxiety    Chronic rhinitis    Claustrophobia    Diverticulosis of colon (without mention of hemorrhage)    DJD (degenerative joint disease)    OA AND PAIN RIGHT KNEE. 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   TOS (thoracic outlet syndrome)    Saw a physical therapist--PAIN RIGHT SHOULDER- MUCH IMPROVED    Past Surgical History:  Procedure Laterality Date   ANTERIOR CERVICAL DECOMP/DISCECTOMY FUSION N/A 11/30/2015   Procedure: CERVICAL SEVEN-THORACIC ONE ANTERIOR CERVICAL DISCECTOMY/FUSION;  Surgeon: Earnie Larsson, MD;  Location: [facility];  Service: Neurosurgery;  Laterality: N/A;   CERVICAL DISC SURGERY  2009   c3-4 5-6/ by Dr Trenton Gammon- FUSION   COLONOSCOPY     TONSILLECTOMY     AS A CHILD   TOTAL KNEE ARTHROPLASTY Right 07/21/2014   Procedure: TOTAL RIGHT KNEE ARTHROPLASTY;  Surgeon: Mauri Pole, MD;  Location: [facility];  Service: Orthopedics;  Laterality: Right;   TOTAL KNEE ARTHROPLASTY Left 08/25/2019   Procedure: TOTAL KNEE ARTHROPLASTY;  Surgeon: Gaynelle Arabian, MD;  Location: [facility];  Service:  Orthopedics;  Laterality: Left;  23mn    Family History  Problem Relation Age of Onset   Other Mother        870 "old age". long health in grandparents as well   Other Father        967 "old age"   Colon polyps Father        adenomas   Healthy Sister    Colon cancer Paternal Aunt     Social History   Tobacco Use   Smoking status: Former    Packs/day: 0.50    Years: 5.00    Total pack years: 2.50    Types: Cigarettes    Quit date: 09/25/1973    Years since quitting: 48.8   Smokeless tobacco: Never  Vaping Use   Vaping Use: Never used  Substance Use Topics   Alcohol use: Yes    Alcohol/week: 7.0 standard drinks of alcohol    Types: 7 Standard drinks or equivalent per week    Comment:  2 drinks per night weekend, vodka & grapefruit juice    Drug use: No     Allergies  Allergen Reactions   Tamsulosin Anxiety    Review of Systems NEGATIVE UNLESS OTHERWISE INDICATED IN HPI      Objective:     BP (!) 156/82 (BP Location: Left Arm)   Pulse (!) 58  Temp (!) 97.5 F (36.4 C) (Temporal)   Ht '6\' 1"'$  (1.854 m)   Wt 188 lb 12.8 oz (85.6 kg)   SpO2 97%   BMI 24.91 kg/m   Wt Readings from Last 3 Encounters:  07/24/22 188 lb 12.8 oz (85.6 kg)  01/16/22 191 lb 6 oz (86.8 kg)  10/31/21 194 lb 9.6 oz (88.3 kg)    BP Readings from Last 3 Encounters:  07/24/22 (!) 156/82  01/16/22 (!) 157/76  10/31/21 (!) 148/80     Physical Exam Vitals and nursing note reviewed.  Constitutional:      General: He is not in acute distress.    Appearance: Normal appearance. He is not ill-appearing.  HENT:     Head: Normocephalic.     Right Ear: Tympanic membrane, ear canal and external ear normal.     Left Ear: Tympanic membrane, ear canal and external ear normal.     Nose: Congestion present.     Mouth/Throat:     Mouth: Mucous membranes are moist.     Pharynx: No oropharyngeal exudate or posterior oropharyngeal erythema.     Comments: +postnasal drip Eyes:      Extraocular Movements: Extraocular movements intact.     Conjunctiva/sclera: Conjunctivae normal.     Pupils: Pupils are equal, round, and reactive to light.  Cardiovascular:     Rate and Rhythm: Normal rate and regular rhythm.     Pulses: Normal pulses.     Heart sounds: Normal heart sounds. No murmur heard. Pulmonary:     Effort: Pulmonary effort is normal. No respiratory distress.     Breath sounds: Normal breath sounds. No wheezing.     Comments: Occasional cough Musculoskeletal:     Cervical back: Normal range of motion.  Skin:    General: Skin is warm.  Neurological:     Mental Status: He is alert and oriented to person, place, and time.  Psychiatric:        Mood and Affect: Mood normal.        Behavior: Behavior normal.        Assessment & Plan:  Acute cough -     methylPREDNISolone Acetate  Sinus congestion  Other orders -     Cheratussin AC; Take 5 mLs by mouth 3 (three) times daily as needed for cough.  Dispense: 120 mL; Refill: 0   Patient reports that the symptoms feel exactly the same as the last time he was in here; which looking back this was in April of this year.  He states that he did well with the Depo-Medrol injection and Cheratussin cough syrup, he is requesting both of these again today.  I am agreeable with this plan as his presentation does seem the same.  He has not had fever chills or body aches to make me think of underlying etiology such as COVID or flu.  Patient is aware that should his symptoms worsen or not improve by this coming weekend, we might need to consider antibiotic treatment at that time.  Patient is going to call and update [place] if anything changes.     Return if symptoms worsen or fail to improve.  This note was prepared with assistance of Systems analyst. Occasional wrong-word or sound-a-like substitutions may have occurred due to the inherent limitations of voice recognition software.    Danon Lograsso M Sherrita Riederer,  PA-C

## 2022-07-24 NOTE — Patient Instructions (Signed)
Take the Cheratussin syrup as directed.  Caution with driving.  Use Flonase and daily allergy medication.  Please start to use nasal saline every time you come back inside from the outdoors.  Let us know if worse or not improving.

## 2022-07-28 ENCOUNTER — Ambulatory Visit (INDEPENDENT_AMBULATORY_CARE_PROVIDER_SITE_OTHER): Payer: Medicare Other

## 2022-07-28 DIAGNOSIS — Z0000 Encounter for general adult medical examination without abnormal findings: Secondary | ICD-10-CM

## 2022-07-28 NOTE — Patient Instructions (Signed)
[name] , Thank you for taking time to come for your Medicare Wellness Visit. I appreciate your ongoing commitment to your health goals. Please review the following plan we discussed and let me know if I can assist you in the future.   These are the goals we discussed:  Goals      DIET - EAT MORE FRUITS AND VEGETABLES     Have less red meat  Check out  online nutrition programs as GumSearch.nl and http://vang.com/; fit6m; Look for foods with "whole" wheat; bran; oatmeal etc Shot at the farmer's markets in season for fresher choices  Watch for "hydrogenated" on the label of oils which are trans-fats.  Watch for "high fructose corn syrup" in snacks, yogurt or ketchup  Meats have less marbling; bright colored fruits and vegetables;  Canned; dump out liquid and wash vegetables. Be mindful of what we are eating  Portion control is essential to a health weight! Sit down; take a break and enjoy your meal; take smaller bites; put the fork down between bites;  It takes 20 minutes to get full; so check in with your fullness cues and stop eating when you start to fill full            Patient Stated     Stay active      stay active        This is a list of the screening recommended for you and due dates:  Health Maintenance  Topic Date Due   COVID-19 Vaccine (4 - Moderna series) 08/09/2022*   Medicare Annual Wellness Visit  07/29/2023 76   Tetanus Vaccine  07/15/2025   Pneumonia Vaccine  Completed   Flu Shot  Completed   Hepatitis C Screening: USPSTF Recommendation to screen - Ages 18-79 yo.  Completed   Zoster (Shingles) Vaccine  Completed   HPV Vaccine  Aged Out   Colon Cancer Screening  Discontinued  *Topic was postponed. The date shown is not the original due date.    Advanced directives: copies in chart   Conditions/risks identified: stay active   Next appointment: Follow up in one year for your annual wellness visit.   Preventive Care 672Years and Older,  Male  Preventive care refers to lifestyle choices and visits with your health care provider that can promote health and wellness. What does preventive care include? A yearly physical exam. 76 This is also called an annual well check. Dental exams once or twice a year. Routine eye exams. 76 Ask your health care provider how often you should have your eyes checked. Personal lifestyle choices, including: Daily care of your teeth and gums. Regular physical activity. Eating a healthy diet. Avoiding tobacco and drug use. Limiting alcohol use. Practicing safe sex. Taking low doses of aspirin every day. Taking vitamin and mineral supplements as recommended by your health care provider. What happens during an annual well check? The services and screenings done by your health care provider during your annual well check will depend on your age 76, overall health, lifestyle risk factors, and family history of disease. Counseling  Your health care provider may ask you questions about your: Alcohol use. Tobacco use. Drug use. Emotional well-being. Home and relationship well-being. Sexual activity. Eating habits. History of falls. Memory and ability to understand (cognition). Work and work eStatistician Screening  You may have the following tests or measurements: Height, weight, and BMI. 76 Blood pressure. Lipid and cholesterol levels. These may be checked every 5 years, or more frequently if you are over 77  years old. Skin check. Lung cancer screening. You may have this screening every year starting at age 77 if you have a 30-pack-year history of smoking and currently smoke or have quit within the past 15 years. Fecal occult blood test (FOBT) of the stool. You may have this test every year starting at age 77. Flexible sigmoidoscopy or colonoscopy. You may have a sigmoidoscopy every 5 years or a colonoscopy every 10 years starting at age 77. Prostate cancer screening. Recommendations will vary depending  on your family history and other risks. Hepatitis C blood test. Hepatitis B blood test. Sexually transmitted disease (STD) testing. Diabetes screening. This is done by checking your blood sugar (glucose) after you have not eaten for a while (fasting). You may have this done every 1-3 years. Abdominal aortic aneurysm (AAA) screening. You may need this if you are a current or former smoker. Osteoporosis. You may be screened starting at age 77 if you are at high risk. Talk with your health care provider about your test results, treatment options, and if necessary, the need for more tests. Vaccines  Your health care provider may recommend certain vaccines, such as: Influenza vaccine. This is recommended every year. Tetanus, diphtheria, and acellular pertussis (Tdap, Td) vaccine. You may need a Td booster every 10 years. Zoster vaccine. You may need this after age 77. Pneumococcal 13-valent conjugate (PCV13) vaccine. One dose is recommended after age 77. Pneumococcal polysaccharide (PPSV23) vaccine. One dose is recommended after age 77. Talk to your health care provider about which screenings and vaccines you need and how often you need them. This information is not intended to replace advice given to you by your health care provider. Make sure you discuss any questions you have with your health care provider. Document Released: 10/08/2015 Document Revised: 05/31/2016 Document Reviewed: 07/13/2015 Elsevier Interactive Patient Education  2017 [place] Prevention in the Home Falls can cause injuries. They can happen to people of all ages. There are many things you can do to make your home safe and to help prevent falls. What can I do on the outside of my home? Regularly fix the edges of walkways and driveways and fix any cracks. Remove anything that might make you trip as you walk through a door, such as a raised step or threshold. Trim any bushes or trees on the path to your home. Use  bright outdoor lighting. Clear any walking paths of anything that might make someone trip, such as rocks or tools. Regularly check to see if handrails are loose or broken. Make sure that both sides of any steps have handrails. Any raised decks and porches should have guardrails on the edges. Have any leaves, snow, or ice cleared regularly. Use sand or salt on walking paths during winter. Clean up any spills in your garage right away. This includes oil or grease spills. What can I do in the bathroom? Use night lights. Install grab bars by the toilet and in the tub and shower. Do not use towel bars as grab bars. Use non-skid mats or decals in the tub or shower. If you need to sit down in the shower, use a plastic, non-slip stool. Keep the floor dry. Clean up any water that spills on the floor as soon as it happens. Remove soap buildup in the tub or shower regularly. Attach bath mats securely with double-sided non-slip rug tape. Do not have throw rugs and other things on the floor that can make you trip. What can I  do in the bedroom? Use night lights. Make sure that you have a light by your bed that is easy to reach. Do not use any sheets or blankets that are too big for your bed. They should not hang down onto the floor. Have a firm chair that has side arms. You can use this for support while you get dressed. Do not have throw rugs and other things on the floor that can make you trip. What can I do in the kitchen? Clean up any spills right away. Avoid walking on wet floors. Keep items that you use a lot in easy-to-reach places. If you need to reach something above you, use a strong step stool that has a grab bar. Keep electrical cords out of the way. Do not use floor polish or wax that makes floors slippery. If you must use wax, use non-skid floor wax. Do not have throw rugs and other things on the floor that can make you trip. What can I do with my stairs? Do not leave any items on the  stairs. Make sure that there are handrails on both sides of the stairs and use them. Fix handrails that are broken or loose. Make sure that handrails are as long as the stairways. Check any carpeting to make sure that it is firmly attached to the stairs. Fix any carpet that is loose or worn. Avoid having throw rugs at the top or bottom of the stairs. If you do have throw rugs, attach them to the floor with carpet tape. Make sure that you have a light switch at the top of the stairs and the bottom of the stairs. If you do not have them, ask someone to add them for you. What else can I do to help prevent falls? Wear shoes that: Do not have high heels. Have rubber bottoms. Are comfortable and fit you well. Are closed at the toe. Do not wear sandals. If you use a stepladder: Make sure that it is fully opened. Do not climb a closed stepladder. Make sure that both sides of the stepladder are locked into place. Ask someone to hold it for you, if possible. Clearly mark and make sure that you can see: Any grab bars or handrails. First and last steps. Where the edge of each step is. Use tools that help you move around (mobility aids) if they are needed. These include: Canes. Walkers. Scooters. Crutches. Turn on the lights when you go into a dark area. Replace any light bulbs as soon as they burn out. Set up your furniture so you have a clear path. Avoid moving your furniture around. If any of your floors are uneven, fix them. If there are any pets around you, be aware of where they are. Review your medicines with your doctor. Some medicines can make you feel dizzy. This can increase your chance of falling. Ask your doctor what other things that you can do to help prevent falls. This information is not intended to replace advice given to you by your health care provider. Make sure you discuss any questions you have with your health care provider. Document Released: 07/08/2009 Document Revised:  02/17/2016 Document Reviewed: 10/16/2014 Elsevier Interactive Patient Education  2017 [employer].

## 2022-07-28 NOTE — Progress Notes (Signed)
I connected with  Vanita Panda on 07/28/22 by a audio enabled telemedicine application and verified that I am speaking with the correct person using two identifiers.  Patient Location: Home  Provider Location: Office/Clinic  I discussed the limitations of evaluation and management by telemedicine. The patient expressed understanding and agreed to proceed.   Subjective:   Joshua [name] is a 76 y.o. male who presents for Medicare Annual/Subsequent preventive examination.  Review of Systems      Cardiac Risk Factors include: advanced age (>92mn, >>43women);dyslipidemia;male gender     Objective:    There were no vitals filed for this visit. There is no height or weight on file to calculate BMI.     07/28/2022    8:35 AM 10/23/2019    2:21 PM 08/25/2019    8:47 PM 08/20/2019   10:13 AM 10/16/2018    3:33 PM 10/04/2017    9:53 AM 11/29/2015    2:53 PM  Advanced Directives  Does Patient Have a Medical Advance Directive? Yes Yes Yes Yes Yes Yes Yes  Type of AParamedicof ACrawfordsvilleLiving will HLivingstonLiving will HFort Leonard WoodLiving will HLindenLiving will HMoline AcresLiving will  Living will  Does patient want to make changes to medical advance directive? No - Patient declined No - Patient declined No - Patient declined  No - Patient declined    Copy of [place] Chart? Yes - validated most recent copy scanned in chart (See row information) Yes - validated most recent copy scanned in chart (See row information) No - copy requested  Yes - validated most recent copy scanned in chart (See row information)  Yes    Current Medications (verified) Outpatient Encounter Medications as of 07/28/2022  Medication Sig   gabapentin (NEURONTIN) 300 MG capsule Take 1 capsule (300 mg total) by mouth 3 (three) times daily.   guaiFENesin-codeine (CHERATUSSIN AC) 100-10 MG/5ML syrup  Take 10 mLs by mouth at bedtime as needed for cough or congestion.   Melatonin 10 MG TABS Take 10 mg by mouth at bedtime.   metroNIDAZOLE (METROGEL) 0.75 % gel SMARTSIG:1 Sparingly Topical Twice Daily   rosuvastatin (CRESTOR) 20 MG tablet TAKE 1 TABLET ONE TIME PER WEEK   ampicillin (PRINCIPEN) 500 MG capsule Take 500 mg by mouth 2 (two) times daily. (Patient not taking: Reported on 07/28/2022)   [DISCONTINUED] guaiFENesin-codeine (CHERATUSSIN AC) 100-10 MG/5ML syrup Take 5 mLs by mouth 3 (three) times daily as needed for cough.   No facility-administered encounter medications on file as of 07/28/2022.    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   GERD (gastroesophageal reflux disease)    ONLY WITH CERTAIN FOODS   History of total right knee replacement    Hx of vertigo    Hyperlipidemia    Hypertension    PTSD (post-traumatic stress disorder)    [place]   TOS (thoracic outlet syndrome)    Saw a physical therapist--PAIN RIGHT SHOULDER- MUCH IMPROVED   Past Surgical History:  Procedure Laterality Date   ANTERIOR CERVICAL DECOMP/DISCECTOMY FUSION N/A 11/30/2015   Procedure: CERVICAL SEVEN-THORACIC ONE ANTERIOR CERVICAL DISCECTOMY/FUSION;  Surgeon: HMallie Mussel  Pool, MD;  Location: [facility];  Service: Neurosurgery;  Laterality: N/A;   CERVICAL DISC SURGERY  2009   c3-4 5-6/ by Dr Trenton Gammon- FUSION   COLONOSCOPY     TONSILLECTOMY     AS A CHILD   TOTAL KNEE ARTHROPLASTY Right 07/21/2014   Procedure: TOTAL RIGHT KNEE ARTHROPLASTY;  Surgeon: Mauri Pole, MD;  Location: [facility];  Service: Orthopedics;  Laterality: Right;   TOTAL KNEE ARTHROPLASTY Left 08/25/2019   Procedure: TOTAL KNEE ARTHROPLASTY;  Surgeon: Gaynelle Arabian, MD;   Location: [facility];  Service: Orthopedics;  Laterality: Left;  63mn   Family History  Problem Relation Age of Onset   Other Mother        86 "old age". long health in grandparents as well   Other Father        918 "old age"   Colon polyps Father        adenomas   Healthy Sister    Colon cancer Paternal Aunt    Social History   Socioeconomic History   Marital status: Married    Spouse name: Not on file   Number of children: Not on file   Years of education: Not on file   Highest education level: Not on file  Occupational History   Occupation: Retired    Comment: STree surgeon Tobacco Use   Smoking status: Former    Packs/day: 0.50    Years: 5.00    Total pack years: 2.50    Types: Cigarettes    Quit date: 09/25/1973    Years since quitting: 48.8   Smokeless tobacco: Never  Vaping Use   Vaping Use: Never used  Substance and Sexual Activity   Alcohol use: Yes    Alcohol/week: 7.0 standard drinks of alcohol    Types: 7 Standard drinks or equivalent per week    Comment:  2 drinks per night weekend, vodka & grapefruit juice    Drug use: No   Sexual activity: Yes  Other Topics Concern   Not on file  Social History Narrative   Married 1982. 1 daughter. No grandkids yet (daughter married 2018)      Retired from [employer] years. [employer] from [employer] [employer] while in college.    [place] to [place]    Goes to [employer]       Hobbies: golf, work out at [place] Corporationat [place] at [place]    Social Determinants of Health   Financial Resource Strain: Low Risk  (07/28/2022)   Overall Financial Resource Strain (CARDIA)    Difficulty of Paying Living Expenses: Not hard at all  Food Insecurity: No Food Insecurity (07/28/2022)   Hunger Vital Sign    Worried About Running Out of Food in the Last Year: Never true    Ran Out of Food in the Last Year: Never true  Transportation Needs: No Transportation Needs (07/28/2022)   PRAPARE -  THydrologist(Medical): No    Lack of Transportation (Non-Medical): No  Physical Activity: Sufficiently Active (07/28/2022)   Exercise Vital Sign    Days of Exercise per Week: 3 days    Minutes of Exercise per Session: 120 min  Stress: No Stress Concern Present (07/28/2022)   [place]   Feeling of Stress : Not at all  Social Connections: Moderately Integrated (07/28/2022)   Social Connection and Isolation Panel [  NHANES]    Frequency of Communication with Friends and Family: Twice a week    Frequency of Social Gatherings with Friends and Family: More than three times a week    Attends Religious Services: More than 4 times per year    Active Member of [employer] or Organizations: No    Attends Music therapist: Never    Marital Status: Married    Tobacco Counseling Counseling given: Not Answered   Clinical Intake:  Pre-visit preparation completed: Yes  Pain : No/denies pain     BMI - recorded: 24.91 Nutritional Status: BMI of 19-24  Normal Nutritional Risks: None Diabetes: No  How often do you need to have someone help you when you read instructions, pamphlets, or other written materials from your doctor or pharmacy?: 1 - Never  Diabetic?no  Interpreter Needed?: No  Information entered by :: Charlott Rakes, LPN   Activities of Daily Living    07/28/2022    8:38 AM  In your present state of health, do you have any difficulty performing the following activities:  Hearing? 0  Vision? 0  Difficulty concentrating or making decisions? 0  Walking or climbing stairs? 0  Dressing or bathing? 0  Doing errands, shopping? 0  Preparing Food and eating ? N  Using the Toilet? N  In the past six months, have you accidently leaked urine? N  Do you have problems with loss of bowel control? N  Managing your Medications? N  Managing your Finances? N  Housekeeping or managing  your Housekeeping? N    Patient Care Team: Marin Olp, MD as PCP - General (Family Medicine) Gaynelle Arabian, MD as Consulting Physician (Orthopedic Surgery) Jarome Matin, MD as Consulting Physician (Dermatology) Tanda Rockers, MD as Consulting Physician (Pulmonary Disease) Earnie Larsson, MD as Consulting Physician (Neurosurgery) Clinic, [facility] as Consulting Physician  Indicate any recent Medical Services you may have received from other than [facility] providers in the past year (date may be approximate).     Assessment:   This is a routine wellness examination for [facility].  Hearing/Vision screen Hearing Screening - Comments:: Pt has hearing aids  Vision Screening - Comments:: Pt follows up with Dr Bing Plume and [place] for annual eye exams   Dietary issues and exercise activities discussed: Current Exercise Habits: Home exercise routine, Type of exercise: Other - see comments, Time (Minutes): > 60, Frequency (Times/Week): 3, Weekly Exercise (Minutes/Week): 0   Goals Addressed             This Visit's Progress    Patient Stated       Stay active      stay active         Depression Screen    07/28/2022    8:34 AM 10/31/2021   10:23 AM 10/28/2020   10:27 AM 09/03/2020   12:56 PM 10/23/2019    2:22 PM 10/23/2019    9:30 AM 07/14/2019    2:01 PM  PHQ 2/9 Scores  PHQ - 2 Score 0 0 0 0 0 0 0  PHQ- 9 Score  2         Fall Risk    07/28/2022    8:35 AM 10/31/2021   10:23 AM 10/28/2020   10:27 AM 10/23/2019    2:22 PM 10/23/2019    9:31 AM  Fall Risk   Falls in the past year? 0 0 0 0 0  Number falls in past yr: 0 0 0 0 0  Injury with  Fall? 0 0 0 0 0  Risk for fall due to : Impaired vision   Orthopedic patient   Follow up Falls prevention discussed   Falls evaluation completed;Education provided;Falls prevention discussed     FALL RISK PREVENTION PERTAINING TO THE HOME:  Any stairs in or around the home? Yes  If so, are there any without handrails? No  Home free of  loose throw rugs in walkways, pet beds, electrical cords, etc? Yes  Adequate lighting in your home to reduce risk of falls? Yes   ASSISTIVE DEVICES UTILIZED TO PREVENT FALLS:  Life alert? No  Use of a cane, walker or w/c? No  Grab bars in the bathroom? No  Shower chair or bench in shower? No  Elevated toilet seat or a handicapped toilet? No   TIMED UP AND GO:  Was the test performed? No .   Cognitive Function:    10/16/2018    3:58 PM  MMSE - Mini Mental State Exam  Orientation to time 5  Orientation to Place 5  Registration 3  Attention/ Calculation 4  Recall 2  Language- name 2 objects 2  Language- repeat 1  Language- follow 3 step command 3  Language- read & follow direction 1  Write a sentence 1  Copy design 1  Total score 28        07/28/2022    8:39 AM 10/23/2019    2:22 PM  6CIT Screen  What Year? 0 points 0 points  What month? 0 points 0 points  What time? 0 points 0 points  Count back from 20 0 points 0 points  Months in reverse 0 points 0 points  Repeat phrase 2 points 0 points  Total Score 2 points 0 points    Immunizations Immunization History  Administered Date(s) Administered   Fluad Quad(high Dose 65+) 07/14/2019, 07/26/2021, 07/13/2022   Influenza Split 07/26/2011, 07/02/2015   Influenza Whole 06/25/2012   Influenza, High Dose Seasonal PF 07/19/2017   Influenza,inj,Quad PF,6+ Mos 07/08/2013, 07/20/2014   Influenza,inj,quad, With Preservative 07/16/2019   Influenza-Unspecified 07/04/2018, 05/31/2020   Moderna Sars-Covid-2 Vaccination 02/04/2020, 03/03/2020   Pneumococcal Conjugate-13 07/16/2015   Pneumococcal Polysaccharide-23 06/25/2006, 11/10/2010   Td 11/23/2004   Tdap 07/16/2015   Zoster Recombinat (Shingrix) 07/02/2017, 09/03/2017    TDAP status: Up to date  Flu Vaccine status: Up to date  Pneumococcal vaccine status: Up to date  Covid-19 vaccine status: Completed vaccines  Qualifies for Shingles Vaccine? Yes   Zostavax  completed Yes   Shingrix Completed?: Yes  Screening Tests Health Maintenance  Topic Date Due   COVID-19 Vaccine (4 - Moderna series) 08/09/2022 (Originally 01/19/2022)   Medicare Annual Wellness (AWV)  07/29/2023   TETANUS/TDAP  07/15/2025   Pneumonia Vaccine 25+ Years old  Completed   INFLUENZA VACCINE  Completed   Hepatitis C Screening  Completed   Zoster Vaccines- Shingrix  Completed   HPV VACCINES  Aged Out   COLONOSCOPY (Pts 45-25yr Insurance coverage will need to be confirmed)  Discontinued    Health Maintenance  There are no preventive care reminders to display for this patient.   Colorectal cancer screening: Type of screening: Colonoscopy. Completed 04/02/13. Repeat every as directed  years   Additional Screening:  Hepatitis C Screening:  Completed 03/26/17  Vision Screening: Recommended annual ophthalmology exams for early detection of glaucoma and other disorders of the eye. Is the patient up to date with their annual eye exam?  Yes  Who is the provider or what  is the name of the office in which the patient attends annual eye exams? [facility] [place]  If pt is not established with a provider, would they like to be referred to a provider to establish care? No .   Dental Screening: Recommended annual dental exams for proper oral hygiene  Community Resource Referral / Chronic Care Management: CRR required this visit?  No   CCM required this visit?  No      Plan:     I have personally reviewed and noted the following in the patient's chart:   Medical and social history Use of alcohol, tobacco or illicit drugs  Current medications and supplements including opioid prescriptions. Patient is not currently taking opioid prescriptions. Functional ability and status Nutritional status Physical activity Advanced directives List of other physicians Hospitalizations, surgeries, and ER visits in previous 12 months Vitals Screenings to include cognitive, depression, and  falls Referrals and appointments  In addition, I have reviewed and discussed with patient certain preventive protocols, quality metrics, and best practice recommendations. A written personalized care plan for preventive services as well as general preventive health recommendations were provided to patient.     Willette Brace, LPN   [id number]   Nurse Notes: none

## 2022-11-02 ENCOUNTER — Encounter: Payer: Self-pay | Admitting: Family Medicine

## 2022-11-02 ENCOUNTER — Ambulatory Visit (INDEPENDENT_AMBULATORY_CARE_PROVIDER_SITE_OTHER): Payer: Medicare Other | Admitting: Family Medicine

## 2022-11-02 VITALS — BP 120/70 | HR 55 | Temp 97.3°F | Ht 73.0 in | Wt 188.0 lb

## 2022-11-02 DIAGNOSIS — I700 Atherosclerosis of aorta: Secondary | ICD-10-CM | POA: Diagnosis not present

## 2022-11-02 DIAGNOSIS — Z0000 Encounter for general adult medical examination without abnormal findings: Secondary | ICD-10-CM

## 2022-11-02 DIAGNOSIS — E785 Hyperlipidemia, unspecified: Secondary | ICD-10-CM

## 2022-11-02 DIAGNOSIS — Z1211 Encounter for screening for malignant neoplasm of colon: Secondary | ICD-10-CM

## 2022-11-02 LAB — COMPREHENSIVE METABOLIC PANEL
ALT: 16 U/L (ref 0–53)
AST: 16 U/L (ref 0–37)
Albumin: 4.2 g/dL (ref 3.5–5.2)
Alkaline Phosphatase: 62 U/L (ref 39–117)
BUN: 18 mg/dL (ref 6–23)
CO2: 28 mEq/L (ref 19–32)
Calcium: 8.8 mg/dL (ref 8.4–10.5)
Chloride: 104 mEq/L (ref 96–112)
Creatinine, Ser: 1.16 mg/dL (ref 0.40–1.50)
GFR: 60.84 mL/min (ref >60.00)
Glucose, Bld: 86 mg/dL (ref 70–99)
Potassium: 4.3 mEq/L (ref 3.5–5.1)
Sodium: 140 mEq/L (ref 135–145)
Total Bilirubin: 1 mg/dL (ref 0.2–1.2)
Total Protein: 7.2 g/dL (ref 6.0–8.3)

## 2022-11-02 LAB — CBC WITH DIFFERENTIAL/PLATELET
Basophils Absolute: 0 10*3/uL (ref 0.0–0.1)
Basophils Relative: 0.4 % (ref 0.0–3.0)
Eosinophils Absolute: 0.1 10*3/uL (ref 0.0–0.7)
Eosinophils Relative: 2.2 % (ref 0.0–5.0)
HCT: 44.3 % (ref 39.0–52.0)
Hemoglobin: 15.2 g/dL (ref 13.0–17.0)
Lymphocytes Relative: 28.9 % (ref 12.0–46.0)
Lymphs Abs: 1.8 10*3/uL (ref 0.7–4.0)
MCHC: 34.2 g/dL (ref 30.0–36.0)
MCV: 91.8 fl (ref 78.0–100.0)
Monocytes Absolute: 0.7 10*3/uL (ref 0.1–1.0)
Monocytes Relative: 10.3 % (ref 3.0–12.0)
Neutro Abs: 3.7 10*3/uL (ref 1.4–7.7)
Neutrophils Relative %: 58.2 % (ref 43.0–77.0)
Platelets: 241 10*3/uL (ref 150.0–400.0)
RBC: 4.83 Mil/uL (ref 4.22–5.81)
RDW: 14.5 % (ref 11.5–15.5)
WBC: 6.3 10*3/uL (ref 4.0–10.5)

## 2022-11-02 LAB — LIPID PANEL
Cholesterol: 149 mg/dL (ref 0–200)
HDL: 44.6 mg/dL (ref >39.00)
LDL Cholesterol: 92 mg/dL (ref 0–99)
NonHDL: 104.66
Total CHOL/HDL Ratio: 3
Triglycerides: 61 mg/dL (ref 0.0–149.0)
VLDL: 12.2 mg/dL (ref 0.0–40.0)

## 2022-11-02 MED ORDER — GABAPENTIN 300 MG PO CAPS: 300.0000 mg | ORAL_CAPSULE | Freq: Three times a day (TID) | ORAL | 5 refills | Status: DC

## 2022-11-02 NOTE — Progress Notes (Signed)
Phone: [phone]   Subjective:  Patient presents today for their annual physical. Chief complaint-noted.   See problem oriented charting- ROS- full  review of systems was completed and negative  except for: hearing loss- wearing aids, visual problems with cataract, urinary frequency, urinary urgency, joint pain- knees even with surgery history, seasonal allergies  The following were reviewed and entered/updated in epic: Past Medical History:  Diagnosis Date   Chronic anxiety    Chronic rhinitis    Claustrophobia    Diverticulosis of colon (without mention of hemorrhage)    DJD (degenerative joint disease)    OA AND PAIN RIGHT KNEE. 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   TOS (thoracic outlet syndrome)    Saw a physical therapist--PAIN RIGHT SHOULDER- MUCH IMPROVED   Patient Active Problem List   Diagnosis Date Noted   Aortic atherosclerosis ([place]) 12/07/2020    Priority: Medium    Insomnia 07/18/2015    Priority: Medium    Hyperlipidemia, unspecified 09/16/2008    Priority: Medium    Chronic rhinitis 09/16/2008    Priority: Medium    Former smoker 03/26/2017    Priority: Low   GERD (gastroesophageal reflux disease) 03/26/2017    Priority: Low   Allergic rhinitis 03/26/2017    Priority: Low   Cervical spondylosis with radiculopathy 11/30/2015    Priority: Low   TOS (thoracic outlet syndrome)     Priority: Low   Cough 10/27/2008    Priority: Low   OA (osteoarthritis) of knee 09/16/2008    Priority: Low   VERTIGO 09/16/2008    Priority: Low   S/P total knee arthroplasty, left 08/25/2019   S/P total knee arthroplasty 08/25/2019   Past Surgical History:  Procedure Laterality Date   ANTERIOR CERVICAL DECOMP/DISCECTOMY FUSION N/A 11/30/2015    Procedure: CERVICAL SEVEN-THORACIC ONE ANTERIOR CERVICAL DISCECTOMY/FUSION;  Surgeon: Earnie Larsson, MD;  Location: [facility];  Service: Neurosurgery;  Laterality: N/A;   CATARACT EXTRACTION     right in 2024, considering left. with [place] SURGERY  2009   c3-4 5-6/ by Dr Trenton Gammon- FUSION   COLONOSCOPY     TONSILLECTOMY     AS A CHILD   TOTAL KNEE ARTHROPLASTY Right 07/21/2014   Procedure: TOTAL RIGHT KNEE ARTHROPLASTY;  Surgeon: Mauri Pole, MD;  Location: [facility];  Service: Orthopedics;  Laterality: Right;   TOTAL KNEE ARTHROPLASTY Left 08/25/2019   Procedure: TOTAL KNEE ARTHROPLASTY;  Surgeon: Gaynelle Arabian, MD;  Location: [facility];  Service: Orthopedics;  Laterality: Left;  73mn    Family History  Problem Relation Age of Onset   Other Mother        829 "old age". long health in grandparents as well   Other Father        971 "old age"   Colon polyps Father        adenomas   Healthy Sister    Colon cancer Paternal Aunt     Medications- reviewed and updated Current Outpatient Medications  Medication Sig Dispense Refill   gabapentin (NEURONTIN) 300 MG capsule Take 1 capsule (300 mg total) by mouth 3 (three) times daily. 30 capsule 5   Melatonin 10 MG TABS Take 10 mg by mouth at  bedtime.     rosuvastatin (CRESTOR) 20 MG tablet TAKE 1 TABLET ONE TIME PER WEEK 13 tablet 3   No current facility-administered medications for this visit.    Allergies-reviewed and updated Allergies  Allergen Reactions   Tamsulosin Anxiety    Social History   Social History Narrative   Married 1982. 1 daughter. 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    Went to [employer] in [employer].    Goes to [employer].       Hobbies: golf, work out at [employer] at [place], place up at [employer].    Objective  Objective:  BP 120/70   Pulse (!) 55   Temp (!) 97.3 F (36.3 C)   Ht '6\' 1"'$   (1.854 m)   Wt 188 lb (85.3 kg)   SpO2 98%   BMI 24.80 kg/m  Gen: NAD, resting comfortably HEENT: Mucous membranes are moist. Oropharynx normal Neck: no thyromegaly CV: RRR no murmurs rubs or gallops Lungs: CTAB no crackles, wheeze, rhonchi Abdomen: soft/nontender/nondistended/normal bowel sounds. No rebound or guarding.  Ext: no edema Skin: warm, dry Neuro: grossly normal, moves all extremities, PERRLA    Assessment and Plan  77 y.o. male presenting for annual physical.  Health Maintenance counseling: 1. Anticipatory guidance: Patient counseled regarding regular dental exams -q6 months, eye exams -yearly,  avoiding smoking and second hand smoke , limiting alcohol to 2 beverages per day , no illicit drugs .   2. Risk factor reduction:  Advised patient of need for regular exercise and diet rich and fruits and vegetables to reduce risk of heart attack and stroke.  Exercise- at least 3 days a week still and more if time allows. 02 fitness.  Diet/weight management-Down 6 pounds in the last year- trying to eat healthy diet. .  Wt Readings from Last 3 Encounters:  11/02/22 188 lb (85.3 kg)  07/24/22 188 lb 12.8 oz (85.6 kg)  01/16/22 191 lb 6 oz (86.8 kg)  3. Immunizations/screenings/ancillary studies-COVID shot- holding off long term Immunization History  Administered Date(s) Administered   Fluad Quad(high Dose 65+) 07/14/2019, 07/26/2021, 07/13/2022   Influenza Split 07/26/2011, 07/02/2015   Influenza Whole 06/25/2012   Influenza, High Dose Seasonal PF 07/19/2017   Influenza,inj,Quad PF,6+ Mos 07/08/2013, 07/20/2014   Influenza,inj,quad, With Preservative 07/16/2019   Influenza-Unspecified 07/04/2018, 05/31/2020   Moderna Sars-Covid-2 Vaccination 02/04/2020, 03/03/2020   Pneumococcal Conjugate-13 07/16/2015   Pneumococcal Polysaccharide-23 06/25/2006, 11/10/2010   Td 11/23/2004   Tdap 07/16/2015   Zoster Recombinat (Shingrix) 07/02/2017, 09/03/2017  4. Prostate cancer  screening- past formal age based screening recommendations.  Urinary urgency stable Lab Results  Component Value Date   PSA 0.630 06/18/2019   PSA 0.54 11/10/2010   PSA 0.75 08/05/2009   5. Colon cancer screening - normal 03/2013 with Dr. Alanson Aly and was told no recall due to age. Could consider stool cards at 2024 (he opts in)- occasional hemorrhoid bleeding in past but no recent issues 6. Skin cancer screening- [place] derm dr. Jarome Matin in past- now seeing the [place]. No recent folliculitis issues.  7. Smoking associated screening (lung cancer screening, AAA screen 65-75, UA)- Former smoker- 2.5 pack years quit in 1970s . aaa screen negative 04/26/17  8. STD screening - only active with wife  Status of chronic or acute concerns   #left knee swelling after nov 2020 L knee replacement. Has had  aspiration but fluid returned. We did some labs for Dr. Elmyra Ricks with CRP and ESR but were reassuring. Uric acid also normal  -on gabapentin after knee surgery - helps with sleep and knee pain  #hyperlipidemia- peak LDL 143 so target at least LDL 100 prefer under 70 with aortic atherosclerosis with CT calcium scoring 161-46 percentile for age 10/19/2018 # Aortic atherosclerosis S: Medication: Rosuvastatin '20Mg'$  once a week- cramping on even just twice a week- was needing pickle juice before went back down Lab Results  Component Value Date   CHOL 163 10/31/2021   HDL 45.10 10/31/2021   LDLCALC 100 (H) 10/31/2021   LDLDIRECT 122.0 10/23/2019   TRIG 86.0 10/31/2021   CHOLHDL 4 10/31/2021   A/P: hopefully lipids stable or improved- seems to be on max tolerable dose Aortic atherosclerosis (presumed stable)- LDL goal ideally <70 along with for calcium on coronary arteries but may not be able to get there with side effects  Recommended follow up: Return in about 1 year (around 11/03/2023) for physical or sooner if needed.Schedule b4 you leave. Future Appointments  Date Time Provider [place]  08/03/2023   8:30 AM [facility]   Lab/Order associations: fasting   ICD-10-CM   1. Preventative health care  Z00.00     2. Aortic atherosclerosis (HCC)  I70.0     3. Hyperlipidemia, unspecified hyperlipidemia type  E78.5     4. Screen for colon cancer  Z12.11       No orders of the defined types were placed in this encounter.   Return precautions advised.  Garret Reddish, MD

## 2022-11-02 NOTE — Patient Instructions (Addendum)
Please stop by lab before you go If you have mychart- we will send your results within 3 business days of [place] receiving them.  If you do not have mychart- we will call you about results within 5 business days of [place] receiving them.  *please also note that you will see labs on mychart as soon as they post. I will later go in and write notes on them- will say "notes from Dr. Yong Channel"   Pick up stool cards from labs  Recommended follow up: Return in about 1 year (around 11/03/2023) for physical or sooner if needed.Schedule b4 you leave.

## 2022-11-08 ENCOUNTER — Other Ambulatory Visit (INDEPENDENT_AMBULATORY_CARE_PROVIDER_SITE_OTHER): Payer: Medicare Other

## 2022-11-08 DIAGNOSIS — Z1211 Encounter for screening for malignant neoplasm of colon: Secondary | ICD-10-CM

## 2022-11-08 LAB — FECAL OCCULT BLOOD, IMMUNOCHEMICAL: Fecal Occult Bld: NEGATIVE

## 2023-01-07 ENCOUNTER — Other Ambulatory Visit: Payer: Self-pay | Admitting: Family Medicine

## 2023-05-09 ENCOUNTER — Other Ambulatory Visit (INDEPENDENT_AMBULATORY_CARE_PROVIDER_SITE_OTHER): Payer: Medicare Other

## 2023-05-09 ENCOUNTER — Encounter: Payer: Self-pay | Admitting: Physical Medicine and Rehabilitation

## 2023-05-09 ENCOUNTER — Ambulatory Visit: Payer: Medicare Other | Admitting: Physical Medicine and Rehabilitation

## 2023-05-09 DIAGNOSIS — S161XXA Strain of muscle, fascia and tendon at neck level, initial encounter: Secondary | ICD-10-CM

## 2023-05-09 DIAGNOSIS — M542 Cervicalgia: Secondary | ICD-10-CM

## 2023-05-09 DIAGNOSIS — M7918 Myalgia, other site: Secondary | ICD-10-CM | POA: Diagnosis not present

## 2023-05-09 DIAGNOSIS — M961 Postlaminectomy syndrome, not elsewhere classified: Secondary | ICD-10-CM

## 2023-05-09 MED ORDER — MELOXICAM 15 MG PO TABS: 15.0000 mg | ORAL_TABLET | Freq: Every day | ORAL | 0 refills | Status: DC

## 2023-05-09 MED ORDER — PREDNISONE 50 MG PO TABS: 50.0000 mg | ORAL_TABLET | Freq: Every day | ORAL | 0 refills | Status: DC

## 2023-05-09 NOTE — Progress Notes (Signed)
Joshua [name] - 77 y.o. male MRN [id number]  Date of birth: Jan 16, 1946  Office Visit Note: Visit Date: 05/09/2023 PCP: Shelva Majestic, MD Referred by: Shelva Majestic, MD  Subjective: Chief Complaint  Patient presents with   Neck - Pain   HPI: Joshua [name] is a 77 y.o. male who comes in today as a self referral for evaluation of acute on chronic right sided neck pain. Pain ongoing intermittent for about 3 weeks. No specific aggravating factors. States pain is causing difficulty sleeping. Laying flat seems to alleviate pain significantly.  He describes pain as sharp and sore sensation, currently rates as 6 out of 10. Some relief of pain with home exercise regimen, rest and use of medications. No relief of pain with topical pain creams. CT myelogram of cervical spine from 2016 exhibits solid anterior fusion of C4-C5, C5-C6 and C6-C7. Cervical x-ray exhibits intact fusion at C7-T1. Patient underwent 2 prior surgeries with Dr. Lelon Perla in 2009 and 2017. States he did well post surgery until recently. No history of cervical epidural steroid injections. Patient is very active, exercises multiple times a week including cardio and resistance training. Patient denies focal weakness, numbness and tingling. No recent trauma or falls.    Review of Systems  Musculoskeletal:  Positive for myalgias and neck pain.  Neurological:  Negative for tingling, sensory change, focal weakness and weakness.  All other systems reviewed and are negative.  Otherwise per HPI.  Assessment & Plan: Visit Diagnoses:    ICD-10-CM   1. Cervicalgia  M54.2 XR Cervical Spine 2 or 3 views    Ambulatory referral to Physical Therapy    2. Myofascial pain syndrome  M79.18 XR Cervical Spine 2 or 3 views    Ambulatory referral to Physical Therapy    3. Post laminectomy syndrome  M96.1 Ambulatory referral to Physical Therapy       Plan: Findings:  Chronic, worsening and severe right-sided neck pain.  No radicular  symptoms down the arms.  Patient continues to have severe pain despite good conservative therapy such as home exercise regimen, rest and use of medications.  I obtain 2 cervical paraspinalTenderness noted to right cervical spineCervical spine x-rays in the office today that exhibit view x-rays of the cervical spine in the office todayed 2 view cervical spine x-rays in the office today that shows fusion of C3-C4, C4-C5, C5-C6 and C7-T1. Facet arthropathy noted above fusion at C2-C3, greater on the right. Patients clinical presentation and exam are consistent with cervical strain/myofascial pain. Tenderness noted to right cervical paraspinal region today. He does have pain with side to side rotation of neck. Other differentials include facet mediated pain. His exam today is non focal, no myelopathic symptoms noted. Next step is to place order for short course of formal physical therapy. I also prescribed both oral Prednisone and Meloxicam. If his pain persists we would be quick to obtain new cervical MRI imaging, would also consider diagnostic facet joint blocks above fusion at C2-C3. Patient has no questions regarding plan, I will see him back in 6 weeks for re-evaluation. No red flag symptoms noted upon exam today.     Meds & Orders:  Meds ordered this encounter  Medications   meloxicam (MOBIC) 15 MG tablet    Sig: Take 1 tablet (15 mg total) by mouth daily.    Dispense:  30 tablet    Refill:  0   predniSONE (DELTASONE) 50 MG tablet    Sig: Take 1  tablet (50 mg total) by mouth daily with breakfast. Take until completed.    Dispense:  5 tablet    Refill:  0    Orders Placed This Encounter  Procedures   XR Cervical Spine 2 or 3 views   Ambulatory referral to Physical Therapy    Follow-up: Return for 6 week follow up for re-evaluation.   Procedures: No procedures performed      Clinical History: CLINICAL DATA:  Neck pain extending into the upper extremities bilaterally, left greater than  right. Pain extends into of the scapular areas as well. Left upper extremity pain extending into the fourth and fifth digits.  LUMBAR PUNCTURE FOR CERVICAL MYELOGRAM   After thorough discussion of risks and benefits of the procedure including bleeding, infection, injury to nerves, blood vessels, adjacent structures as well as headache and CSF leak, written and oral informed consent was obtained. Consent was obtained by Dr. Marin Roberts. We discussed the high likelihood of obtaining a diagnostic study.   Patient was positioned prone on the fluoroscopy table. Local anesthesia was provided with 1% lidocaine without epinephrine after prepped and draped in the usual sterile fashion. Puncture was performed at L2-3 using a 3 1/2 inch 22-gauge spinal needle via right paramedian approach. Using a single pass through the dura, the needle was placed within the thecal sac, with return of clear CSF. 10 mL of Isovue-M 300 was injected into the thecal sac, with normal opacification of the nerve roots and cauda equina consistent with free flow within the subarachnoid space. The patient was then moved to the trendelenburg position and contrast flowed into the Cervical spine region.    FINDINGS: CERVICAL MYELOGRAM FINDINGS:   Contrast is best visualized at the C5-6 level and below. Anterior cervical fusion hardware is present at C3-4, C4-5, and C5-6. Anterior fusion is evident at C6-7 as well. There is mild blunting of the nerve roots bilaterally at C5-6 and left greater than right at C6-7. Slight anterior listhesis is present at C7-T1.   CT CERVICAL MYELOGRAM FINDINGS:   Cervical spine is imaged and skull base through T4. Anterolisthesis at C7-T1 measures 4 mm.   Anterior hardware is present at C3-4, C4-5, and C5-6. There is solid bridging bone at C4-5, C5-6, and C6-7. There is no definite osseous fusion at C3-4.   C2-3:  Negative.   C3-4: Moderate uncovertebral spurring is present  bilaterally. There is residual disc material or ossification of the posterior longitudinal ligament which contacts and displaces the spinal cord. There is distortion the cord and narrowing of central canal to 6 mm. This is worse on the left.   C4-5: Anterior fusion is present. Uncovertebral spurring contributes to moderate foraminal stenosis, worse on the left. The central canal is patent.   C5-6: Anterior fusion is solid. Mild foraminal narrowing bilaterally secondary to uncovertebral disease.   C6-7: The central canal is decompressed. Mild to moderate foraminal narrowing is present bilaterally due to uncovertebral disease.   C7-T1: Grade 1 anterolisthesis is associated with uncovering of a broad-based disc protrusion. There is prominent ligamentum flavum thickening is well. This results in moderate central canal stenosis. Severe foraminal narrowing is evident bilaterally.   IMPRESSION: 1. Solid anterior fusion at C4-5, C5-6, and C6-7. 2. No definite osseous fusion at C5-6 despite anterior hardware. 3. Moderate residual central canal stenosis at C3-4 with ossification of the posterior longitudinal ligament or residual disc material. 4. Moderate foraminal narrowing bilaterally due to uncovertebral disease. 5. Moderate foraminal narrowing bilaterally at C4-5  is worse on the left. 6. Mild foraminal narrowing bilaterally at C5-6. 7. Mild to moderate foraminal narrowing bilaterally at C6-7. 8. Moderate central canal stenosis at C7-T1 due to 4 mm anterolisthesis and uncovering of a broad-based disc protrusion. 9. There is severe foraminal stenosis bilaterally at C7-T1. This potentially affects the C8 nerve roots bilaterally. The patient does have a left C8 radiculopathy.  On: 09/16/2015 15:38   He reports that he quit smoking about 49 years ago. His smoking use included cigarettes. He started smoking about 54 years ago. He has a 2.5 pack-year smoking history. He has never used  smokeless tobacco. No results for input(s): "HGBA1C", "LABURIC" in the last 8760 hours.  Objective:  VS:  HT:    WT:   BMI:     BP:   HR: bpm  TEMP: ( )  RESP:  Physical Exam Vitals and nursing note reviewed.  HENT:     Head: Normocephalic and atraumatic.     Right Ear: External ear normal.     Left Ear: External ear normal.     Nose: Nose normal.     Mouth/Throat:     Mouth: Mucous membranes are moist.  Eyes:     Extraocular Movements: Extraocular movements intact.  Cardiovascular:     Rate and Rhythm: Normal rate.     Pulses: Normal pulses.  Pulmonary:     Effort: Pulmonary effort is normal.  Abdominal:     General: Abdomen is flat. There is no distension.  Musculoskeletal:        General: Tenderness present.     Cervical back: Tenderness present.     Comments: Discomfort noted with side-to-side rotation. Patient has good strength in the upper extremities including 5 out of 5 strength in wrist extension, long finger flexion and APB. Shoulder range of motion is full bilaterally without any sign of impingement. There is no atrophy of the hands intrinsically. Sensation intact bilaterally. Tenderness noted upon palpation of right cervical paraspinal region. Negative Hoffman's sign. Negative Spurling's sign.     Skin:    General: Skin is warm and dry.     Capillary Refill: Capillary refill takes less than 2 seconds.  Neurological:     General: No focal deficit present.     Mental Status: He is alert and oriented to person, place, and time.     Ortho Exam  Imaging: XR Cervical Spine 2 or 3 views  Result Date: 05/09/2023 AP and lateral radiographs of cervical spine shows maintained cervical lordosis, intact ACDF hardware at C3-C4, C4-C5, C5-C6 and C7-T1. Facet arthropathy noted bilaterally above the fusion at C2-C3. No fractures or dislocations.    Past Medical/Family/Surgical/Social History: Medications & Allergies reviewed per EMR, new medications updated. Patient Active  Problem List   Diagnosis Date Noted   Aortic atherosclerosis (HCC) 12/07/2020   S/P total knee arthroplasty, left 08/25/2019   S/P total knee arthroplasty 08/25/2019   Former smoker 03/26/2017   GERD (gastroesophageal reflux disease) 03/26/2017   Allergic rhinitis 03/26/2017   Cervical spondylosis with radiculopathy 11/30/2015   Insomnia 07/18/2015   TOS (thoracic outlet syndrome)    Cough 10/27/2008   Hyperlipidemia, unspecified 09/16/2008   Chronic rhinitis 09/16/2008   OA (osteoarthritis) of knee 09/16/2008   VERTIGO 09/16/2008   Past Medical History:  Diagnosis Date   Chronic anxiety    Chronic rhinitis    Claustrophobia    Diverticulosis of colon (without mention of hemorrhage)    DJD (degenerative joint disease)  OA AND PAIN RIGHT KNEE. DDD including fusions lumbar spine   GERD (gastroesophageal reflux disease)    ONLY WITH CERTAIN FOODS   History of total right knee replacement    Hx of vertigo    Hyperlipidemia    Hypertension    PTSD (post-traumatic stress disorder)    [place] [employer]   TOS (thoracic outlet syndrome)    Saw a physical therapist--PAIN RIGHT SHOULDER- MUCH IMPROVED   Family History  Problem Relation Age of Onset   Other Mother        73- "old age". long health in grandparents as well   Other Father        61- "old age"   Colon polyps Father        adenomas   Healthy Sister    Colon cancer Paternal Aunt    Past Surgical History:  Procedure Laterality Date   ANTERIOR CERVICAL DECOMP/DISCECTOMY FUSION N/A 11/30/2015   Procedure: CERVICAL SEVEN-THORACIC ONE ANTERIOR CERVICAL DISCECTOMY/FUSION;  Surgeon: Julio Sicks, MD;  Location: [facility];  Service: Neurosurgery;  Laterality: N/A;   CATARACT EXTRACTION     [facility] in [facility]24[facility]   CERVICAL DISC SURGERY  2009   c3-4 5-6/ by Dr Dutch Quint- FUSION   COLONOSCOPY     TONSILLECTOMY     AS A CHILD   TOTAL KNEE ARTHROPLASTY Right 07/21/2014   Procedure: TOTAL RIGHT KNEE  ARTHROPLASTY;  Surgeon: Shelda Pal, MD;  Location: [facility];  Service: Orthopedics;  Laterality: Right;   TOTAL KNEE ARTHROPLASTY Left 08/25/2019   Procedure: TOTAL KNEE ARTHROPLASTY;  Surgeon: Ollen Gross, MD;  Location: [facility];  Service: Orthopedics;  Laterality: Left;    Social History   Occupational History   Occupation: Retired    Comment: Transport planner  Tobacco Use   Smoking status: Former    Current packs/day: 0.00    Average packs/day: 0.5 packs/day for 5.0 years (2.5 ttl pk-yrs)    Types: Cigarettes    Start date: 09/25/1968    Quit date: 09/25/1973    Years since quitting: 49.6   Smokeless tobacco: Never  Vaping Use   Vaping status: Never Used  Substance and Sexual Activity   Alcohol use: Yes    Alcohol/week: 7.0 standard drinks of alcohol    Types: 7 Standard drinks or equivalent per week    Comment:  2 drinks per night weekend, vodka & grapefruit juice    Drug use: No   Sexual activity: Yes

## 2023-05-09 NOTE — Progress Notes (Signed)
Functional Pain Scale - descriptive words and definitions  Distressing (6)    Pain is present/unable to complete most ADLs limited by pain/sleep is difficult and active distraction is only marginal. Moderate range order  Average Pain  varies  Neck pain on the right side that radiates up to the base of the head. Has tried heat, ice, ibuprofen with no relief. Stretching can help

## 2023-05-16 ENCOUNTER — Telehealth: Payer: Self-pay | Admitting: Physical Medicine and Rehabilitation

## 2023-05-16 ENCOUNTER — Telehealth: Payer: Self-pay

## 2023-05-16 DIAGNOSIS — M542 Cervicalgia: Secondary | ICD-10-CM

## 2023-05-16 DIAGNOSIS — S161XXA Strain of muscle, fascia and tendon at neck level, initial encounter: Secondary | ICD-10-CM

## 2023-05-16 MED ORDER — OXYCODONE-ACETAMINOPHEN 5-325 MG PO TABS: 1.0000 | ORAL_TABLET | Freq: Three times a day (TID) | ORAL | 0 refills | Status: AC | PRN

## 2023-05-16 MED ORDER — HYDROCODONE-ACETAMINOPHEN 5-325 MG PO TABS: 1.0000 | ORAL_TABLET | Freq: Three times a day (TID) | ORAL | 0 refills | Status: DC | PRN

## 2023-05-16 NOTE — Addendum Note (Signed)
Addended by: Ashok Norris on: 05/16/2023 11:02 AM   Modules accepted: Orders

## 2023-05-16 NOTE — Telephone Encounter (Signed)
Pharmacist called and stated they do not have Hydrocodone 5-325 mg in stock and no other [facility] does. They have Hydrocodone 7.5-325 mg and 10-325 mg. They also have Oxycodone 5 mg and 10 mg. Please advise

## 2023-05-16 NOTE — Telephone Encounter (Signed)
Rx sent in for short course of hydrocodone.

## 2023-05-16 NOTE — Telephone Encounter (Signed)
Spoke with patient and he would like to have the Hydrocodone sent to [facility]

## 2023-05-16 NOTE — Telephone Encounter (Signed)
Pt called with an updated about medication. Pt states it is not working. Want to discuss next plan of action. Pt states he starts physical therapy but medication is giving him no relief. Please call pt at phone number [phone].

## 2023-05-16 NOTE — Telephone Encounter (Signed)
Changed to oxycodone due to [facility] shortage.

## 2023-05-17 ENCOUNTER — Other Ambulatory Visit: Payer: Self-pay

## 2023-05-17 ENCOUNTER — Encounter: Payer: Self-pay | Admitting: Physical Therapy

## 2023-05-17 ENCOUNTER — Ambulatory Visit: Payer: Medicare Other | Admitting: Physical Therapy

## 2023-05-17 DIAGNOSIS — M542 Cervicalgia: Secondary | ICD-10-CM

## 2023-05-17 NOTE — Therapy (Signed)
OUTPATIENT PHYSICAL THERAPY CERVICAL EVALUATION   Patient Name: Joshua [name] MRN: [id number] DOB:1945-10-28, 77 y.o., male Today's Date: 05/17/2023  END OF SESSION:  PT End of Session - 05/17/23 1037     Visit Number 1    Number of Visits 8    Date for PT Re-Evaluation 07/12/23    Authorization Type [employer] MCR    PT Start Time 0934    PT Stop Time 1030    PT Time Calculation (min) 56 min    Activity Tolerance Patient tolerated treatment well    Behavior During Therapy WFL for tasks assessed/performed             Past Medical History:  Diagnosis Date   Chronic anxiety    Chronic rhinitis    Claustrophobia    Diverticulosis of colon (without mention of hemorrhage)    DJD (degenerative joint disease)    OA AND PAIN RIGHT KNEE. DDD including fusions lumbar spine   GERD (gastroesophageal reflux disease)    ONLY WITH CERTAIN FOODS   History of total right knee replacement    Hx of vertigo    Hyperlipidemia    Hypertension    PTSD (post-traumatic stress disorder)    [place] [employer]   TOS (thoracic outlet syndrome)    Saw a physical therapist--PAIN RIGHT SHOULDER- MUCH IMPROVED   Past Surgical History:  Procedure Laterality Date   ANTERIOR CERVICAL DECOMP/DISCECTOMY FUSION N/A 11/30/2015   Procedure: CERVICAL SEVEN-THORACIC ONE ANTERIOR CERVICAL DISCECTOMY/FUSION;  Surgeon: Julio Sicks, MD;  Location: [facility];  Service: Neurosurgery;  Laterality: N/A;   CATARACT EXTRACTION     right in 2024, [facility]   CERVICAL DISC SURGERY  2009   c3-4 5-6/ by Dr Dutch Quint- FUSION   COLONOSCOPY     TONSILLECTOMY     AS A CHILD   TOTAL KNEE ARTHROPLASTY Right 07/21/2014   Procedure: TOTAL RIGHT KNEE ARTHROPLASTY;  Surgeon: Shelda Pal, MD;  Location: [facility];  Service: Orthopedics;  Laterality: Right;   TOTAL KNEE ARTHROPLASTY Left 08/25/2019   Procedure: TOTAL KNEE ARTHROPLASTY;  Surgeon: Ollen Gross, MD;  Location: [facility];  Service: Orthopedics;  Laterality:  Left;    Patient Active Problem List   Diagnosis Date Noted   Aortic atherosclerosis (HCC) 12/07/2020   S/P total knee arthroplasty, left 08/25/2019   S/P total knee arthroplasty 08/25/2019   Former smoker 03/26/2017   GERD (gastroesophageal reflux disease) 03/26/2017   Allergic rhinitis 03/26/2017   Cervical spondylosis with radiculopathy 11/30/2015   Insomnia 07/18/2015   TOS (thoracic outlet syndrome)    Cough 10/27/2008   Hyperlipidemia, unspecified 09/16/2008   Chronic rhinitis 09/16/2008   OA (osteoarthritis) of knee 09/16/2008   VERTIGO 09/16/2008    PCP: Shelva Majestic, MD   REFERRING PROVIDER: Juanda Chance, NP  REFERRING DIAG: M54.2 (ICD-10-CM) - Cervicalgia M79.18 (ICD-10-CM) - Myofascial pain syndrome M96.1 (ICD-10-CM) - Post laminectomy syndrome  THERAPY DIAG:  Cervicalgia  Rationale for Evaluation and Treatment: Rehabilitation  ONSET DATE: chronic neck pain                                          SUBJECTIVE:  SUBJECTIVE STATEMENT: He says his pain is Rt sided on his neck and it is different every day but is coming more consistent. He does not have the N/T that he was having. He does still take meds.    PERTINENT HISTORY: [id number] fusion C3-T1, anx, claustrophobia,DJD, vertigo, PTSD,   PAIN:  NPRS scale: 7/10 upon arrival Pain location:Rt side of his neck Pain description: constant, can light him up at times Aggravating factors: moving his neck, driving Relieving factors: rest, meds   PRECAUTIONS: None  RED FLAGS: None     WEIGHT BEARING RESTRICTIONS: No  FALLS:  Has patient fallen in last 6 months? No   PLOF: Independent  PATIENT GOALS: reduce, wants to try DN.   NEXT MD VISIT: will see surgeon 05/23/27, referring provider 06/28/23  OBJECTIVE:   DIAGNOSTIC  FINDINGS:  "AP and lateral radiographs of cervical spine shows maintained cervical  lordosis intact ACDF hardware at C3-C4, C4-C5, C5-C6 and C7-T1. Facet  arthropathy noted bilaterally above the fusion at C2-C3. No fractures or  dislocations. "  PATIENT SURVEYS:  Eval: FOTO 49% functional, goal is 62%  COGNITION: Overall cognitive status: Within functional limits for tasks assessed  PALPATION: Tender to palpation with pain rt sided cervical paraspinals and upper trap   CERVICAL ROM:   Active ROM A/PROM (deg) eval  Flexion 30  Extension 20  Right lateral flexion 7  Left lateral flexion 25  Right rotation 35  Left rotation 40   (Blank rows = not tested)  UPPER EXTREMITY ROM:   AROM Right eval Left eval  Shoulder flexion [facility]  Shoulder extension    Shoulder abduction    Shoulder adduction    Shoulder extension    Shoulder internal rotation    Shoulder external rotation    Elbow flexion    Elbow extension    Wrist flexion    Wrist extension    Wrist ulnar deviation    Wrist radial deviation    Wrist pronation    Wrist supination     (Blank rows = not tested)  UPPER EXTREMITY MMT:  MMT Right eval Left eval  Shoulder flexion 5 5  Shoulder extension    Shoulder abduction 5 5  Shoulder adduction    Shoulder extension    Shoulder internal rotation 5 5  Shoulder external rotation 4+ 4+  Middle trapezius    Lower trapezius    Elbow flexion 5 5  Elbow extension 5 5  Wrist flexion    Wrist extension    Wrist ulnar deviation    Wrist radial deviation    Wrist pronation    Wrist supination    Grip strength WFL WFL   (Blank rows = not tested)   TODAY'S TREATMENT:  Eval HEP creation and review with demonstration and trial set preformed, see below for details Manual therapy for active compression and skilled palpation and Trigger Point Dry-Needling  Treatment instructions: Expect mild to moderate muscle soreness. Patient Consent Given: Yes Education  handout provided: verbally provided Muscles treated: Rt sided for Cervical paraspinals and multifidi, upper traps, levator, suboccipitals Treatment response/outcome: good overall tolerance,twitch response noted     PATIENT EDUCATION: Education details: HEP, PT plan of care Person educated: Patient Education method: Explanation, Demonstration, Verbal cues, and Handouts Education comprehension: verbalized understanding and needs further education   HOME EXERCISE PROGRAM: Access Code: [email or web address] URL: [email or web address] Date: 05/17/2023 Prepared by: Ivery Quale  Exercises - Seated Assisted Cervical Rotation with Towel  - 2 x daily -  6 x weekly - 1-2 sets - 10 reps - 5 hold - Seated Levator Scapulae Stretch  - 2 x daily - 6 x weekly - 1 sets - 3 reps - 15 hold - Seated Cervical Sidebending Stretch  - 2 x daily - 6 x weekly - 1 sets - 3 reps - 15 hold - Seated Cervical Retraction  - 2 x daily - 6 x weekly - 1 sets - 10 reps - Standing Shoulder Row with Anchored Resistance  - 2 x daily - 6 x weekly - 2-3 sets - 10 reps - Shoulder External Rotation and Scapular Retraction with Resistance  - 2 x daily - 6 x weekly - 3 sets - 10 reps  ASSESSMENT:  CLINICAL IMPRESSION: Patient referred to PT for cervicalgia and myofascial pain syndrome . He is post op cervical fusion C3-T1". He has cervical hypomobility as well as Right neck muscular pain and tightness with trigger points. We did try DN today which did appear to provide some relief and improved ROM after. Patient will benefit from skilled PT to address below impairments, limitations and improve overall function.  OBJECTIVE IMPAIRMENTS: decreased activity tolerance, decreased neck mobility, decreased ROM, decreased strength, impaired flexibility,  postural dysfunction, and pain.  ACTIVITY LIMITATIONS: tuning his head, lifting, driving PERSONAL FACTORS: [id number] fusion C3-T1, anx, claustrophobia,DJD, vertigo, PTSD, also  affecting patient's functional outcome.  REHAB POTENTIAL: Good  CLINICAL DECISION MAKING: Stable/uncomplicated  EVALUATION COMPLEXITY: Low    GOALS: Short term PT Goals Target date: 06/14/2023   Pt will be I and compliant with HEP. Baseline:  Goal status: New Pt will decrease pain by 25% overall Baseline:7 Goal status: New  Long term PT goals Target date:07/12/2023   Pt will improve neck AROM >10 deg each plane to improve functional mobility.  Baseline: Goal status: New  Pt will improve FOTO to at least 62% functional to show improved function Baseline: Goal status: New Pt will reduce pain to overall less than 4/10 with usual activity and turning his hea Baseline:7 Goal status: New  PLAN: PT FREQUENCY: 1-2 times per week   PT DURATION: 12 weeks  PLANNED INTERVENTIONS (unless contraindicated): aquatic PT, Canalith repositioning, cryotherapy, Electrical stimulation, Iontophoresis with 4 mg/ml dexamethasome, Moist heat, traction, Ultrasound, gait training, Therapeutic exercise, balance training, neuromuscular re-education, patient/family education, prosthetic training, manual techniques, passive ROM, dry needling, taping, vasopnuematic device, vestibular, spinal manipulations, joint manipulations  PLAN FOR NEXT SESSION: review HEP, DN if helpful   April Manson, PT,DPT 05/17/2023, 10:39 AM

## 2023-05-21 ENCOUNTER — Ambulatory Visit (INDEPENDENT_AMBULATORY_CARE_PROVIDER_SITE_OTHER): Payer: Medicare Other | Admitting: Physical Therapy

## 2023-05-21 ENCOUNTER — Encounter: Payer: Self-pay | Admitting: Physical Therapy

## 2023-05-21 DIAGNOSIS — M542 Cervicalgia: Secondary | ICD-10-CM | POA: Diagnosis not present

## 2023-05-21 NOTE — Therapy (Signed)
OUTPATIENT PHYSICAL THERAPY TREATMENT   Patient Name: Joshua [name] MRN: [id number] DOB:1946/07/09, 77 y.o., male Today's Date: 05/21/2023  END OF SESSION:  PT End of Session - 05/21/23 1140     Visit Number 2    Number of Visits 8    Date for PT Re-Evaluation 07/12/23    Authorization Type [employer] MCR    PT Start Time 1140    PT Stop Time 1225    PT Time Calculation (min) 45 min    Activity Tolerance Patient tolerated treatment well    Behavior During Therapy WFL for tasks assessed/performed             Past Medical History:  Diagnosis Date   Chronic anxiety    Chronic rhinitis    Claustrophobia    Diverticulosis of colon (without mention of hemorrhage)    DJD (degenerative joint disease)    OA AND PAIN RIGHT KNEE. DDD including fusions lumbar spine   GERD (gastroesophageal reflux disease)    ONLY WITH CERTAIN FOODS   History of total right knee replacement    Hx of vertigo    Hyperlipidemia    Hypertension    PTSD (post-traumatic stress disorder)    [place] VETER[employer]   TOS (thoracic outlet syndrome)    Saw a physical therapist--PAIN RIGHT SHOULDER- MUCH IMPROVED   Past Surgical History:  Procedure Laterality Date   ANTERIOR CERVICAL DECOMP/DISCECTOMY FUSION N/A 11/30/2015   Procedure: CERVICAL SEVEN-THORACIC ONE ANTERIOR CERVICAL DISCECTOMY/FUSION;  Surgeon: Julio Sicks, MD;  Location: [facility];  Service: Neurosurgery;  Laterality: N/A;   CATARACT EXTRACTION     [facility] in [facility]24[facility]   CERVICAL DISC SURGERY  2009   c3-4 5-6/ by Dr Dutch Quint- FUSION   COLONOSCOPY     TONSILLECTOMY     AS A CHILD   TOTAL KNEE ARTHROPLASTY Right 07/21/2014   Procedure: TOTAL RIGHT KNEE ARTHROPLASTY;  Surgeon: Shelda Pal, MD;  Location: [facility];  Service: Orthopedics;  Laterality: Right;   TOTAL KNEE ARTHROPLASTY Left 08/25/2019   Procedure: TOTAL KNEE ARTHROPLASTY;  Surgeon: Ollen Gross, MD;  Location: [facility];  Service: Orthopedics;  Laterality: Left;     Patient Active Problem List   Diagnosis Date Noted   Aortic atherosclerosis (HCC) 12/07/2020   S/P total knee arthroplasty, left 08/25/2019   S/P total knee arthroplasty 08/25/2019   Former smoker 03/26/2017   GERD (gastroesophageal reflux disease) 03/26/2017   Allergic rhinitis 03/26/2017   Cervical spondylosis with radiculopathy 11/30/2015   Insomnia 07/18/2015   TOS (thoracic outlet syndrome)    Cough 10/27/2008   Hyperlipidemia, unspecified 09/16/2008   Chronic rhinitis 09/16/2008   OA (osteoarthritis) of knee 09/16/2008   VERTIGO 09/16/2008    PCP: Shelva Majestic, MD   REFERRING PROVIDER: Juanda Chance, NP  REFERRING DIAG: M54.2 (ICD-10-CM) - Cervicalgia M79.18 (ICD-10-CM) - Myofascial pain syndrome M96.1 (ICD-10-CM) - Post laminectomy syndrome  THERAPY DIAG:  Cervicalgia  Rationale for Evaluation and Treatment: Rehabilitation  ONSET DATE: chronic neck pain                                          SUBJECTIVE:  SUBJECTIVE STATEMENT: He is interested in DN again, felt it helped some then he had to pick up and take care of grandchild which may have aggravated his neck. He will follow up with his surgeon on Wednesday.    PERTINENT HISTORY: GUY:[id number] fusion C3-T1, anx, claustrophobia,DJD, vertigo, PTSD,   PAIN:  NPRS scale: 7/10 upon arrival Pain location:Rt side of his neck Pain description: constant, can light him up at times Aggravating factors: moving his neck, driving Relieving factors: rest, meds   PRECAUTIONS: None  RED FLAGS: None     WEIGHT BEARING RESTRICTIONS: No  FALLS:  Has patient fallen in last 6 months? No   PLOF: Independent  PATIENT GOALS: reduce, wants to try DN.   NEXT MD VISIT: will see surgeon 05/23/27, referring provider 06/28/23  OBJECTIVE:    DIAGNOSTIC FINDINGS:  "AP and lateral radiographs of cervical spine shows maintained cervical  lordosis intact ACDF hardware at C3-C4, C4-C5, C5-C6 and C7-T1. Facet  arthropathy noted bilaterally above the fusion at C2-C3. No fractures or  dislocations. "  PATIENT SURVEYS:  Eval: FOTO 49% functional, goal is 62%  COGNITION: Overall cognitive status: Within functional limits for tasks assessed  PALPATION: Tender to palpation with pain rt sided cervical paraspinals and upper trap   CERVICAL ROM:   Active ROM A/PROM (deg) eval  Flexion 30  Extension 20  Right lateral flexion 7  Left lateral flexion 25  Right rotation 35  Left rotation 40   (Blank rows = not tested)  UPPER EXTREMITY ROM:   AROM Right eval Left eval  Shoulder flexion [facility]  Shoulder extension    Shoulder abduction    Shoulder adduction    Shoulder extension    Shoulder internal rotation    Shoulder external rotation    Elbow flexion    Elbow extension    Wrist flexion    Wrist extension    Wrist ulnar deviation    Wrist radial deviation    Wrist pronation    Wrist supination     (Blank rows = not tested)  UPPER EXTREMITY MMT:  MMT Right eval Left eval  Shoulder flexion 5 5  Shoulder extension    Shoulder abduction 5 5  Shoulder adduction    Shoulder extension    Shoulder internal rotation 5 5  Shoulder external rotation 4+ 4+  Middle trapezius    Lower trapezius    Elbow flexion 5 5  Elbow extension 5 5  Wrist flexion    Wrist extension    Wrist ulnar deviation    Wrist radial deviation    Wrist pronation    Wrist supination    Grip strength WFL WFL   (Blank rows = not tested)   TODAY'S TREATMENT:  05/21/23 Manual therapy for active compression and skilled palpation and Trigger Point Dry-Needling  Treatment instructions: Expect mild to moderate muscle soreness. Patient Consent Given: Yes Education handout provided: verbally provided Muscles treated: Rt sided for  Cervical paraspinals and multifidi, upper traps, levator, suboccipitals Treatment response/outcome: good overall tolerance,twitch response noted  Manual therapy for suboccital release and cervical rotation PROM Moist heat  to neck X 7 min not included in treatment time  Therex: Cervical rotation stretch with towel 5 sec X 10 Upper trap stretch 15 sec X 3 bilat Levator stretch 15 sec X 3 bilat Seated row machine 35# X 20 Seated chest press machine 35# X 20    PATIENT EDUCATION: Education details: HEP, PT plan of care Person educated: Patient  Education method: Explanation, Demonstration, Verbal cues, and Handouts Education comprehension: verbalized understanding and needs further education   HOME EXERCISE PROGRAM: Access Code: [email or web address] URL: [email or web address] Date: 05/17/2023 Prepared by: Ivery Quale  Exercises - Seated Assisted Cervical Rotation with Towel  - 2 x daily - 6 x weekly - 1-2 sets - 10 reps - 5 hold - Seated Levator Scapulae Stretch  - 2 x daily - 6 x weekly - 1 sets - 3 reps - 15 hold - Seated Cervical Sidebending Stretch  - 2 x daily - 6 x weekly - 1 sets - 3 reps - 15 hold - Seated Cervical Retraction  - 2 x daily - 6 x weekly - 1 sets - 10 reps - Standing Shoulder Row with Anchored Resistance  - 2 x daily - 6 x weekly - 2-3 sets - 10 reps - Shoulder External Rotation and Scapular Retraction with Resistance  - 2 x daily - 6 x weekly - 3 sets - 10 reps  ASSESSMENT:  CLINICAL IMPRESSION:  We continued with DN treatment today followed my mobility and strength work with good overall tolerance. He will follow up with his surgeon next week and we will reassess after that.   Per eval Patient referred to PT for cervicalgia and myofascial pain syndrome . He is post op cervical fusion C3-T1". He has cervical hypomobility as well as Right neck muscular pain and tightness with trigger points. We did try DN today which did appear to provide some relief and  improved ROM after. Patient will benefit from skilled PT to address below impairments, limitations and improve overall function.  OBJECTIVE IMPAIRMENTS: decreased activity tolerance, decreased neck mobility, decreased ROM, decreased strength, impaired flexibility,  postural dysfunction, and pain.  ACTIVITY LIMITATIONS: tuning his head, lifting, driving PERSONAL FACTORS: ZOX:WRUEAVWU fusion C3-T1, anx, claustrophobia,DJD, vertigo, PTSD, also affecting patient's functional outcome.  REHAB POTENTIAL: Good  CLINICAL DECISION MAKING: Stable/uncomplicated  EVALUATION COMPLEXITY: Low    GOALS: Short term PT Goals Target date: 06/14/2023   Pt will be I and compliant with HEP. Baseline:  Goal status: New Pt will decrease pain by 25% overall Baseline:7 Goal status: New  Long term PT goals Target date:07/12/2023   Pt will improve neck AROM >10 deg each plane to improve functional mobility.  Baseline: Goal status: New  Pt will improve FOTO to at least 62% functional to show improved function Baseline: Goal status: New Pt will reduce pain to overall less than 4/10 with usual activity and turning his hea Baseline:7 Goal status: New  PLAN: PT FREQUENCY: 1-2 times per week   PT DURATION: 12 weeks  PLANNED INTERVENTIONS (unless contraindicated): aquatic PT, Canalith repositioning, cryotherapy, Electrical stimulation, Iontophoresis with 4 mg/ml dexamethasome, Moist heat, traction, Ultrasound, gait training, Therapeutic exercise, balance training, neuromuscular re-education, patient/family education, prosthetic training, manual techniques, passive ROM, dry needling, taping, vasopnuematic device, vestibular, spinal manipulations, joint manipulations  PLAN FOR NEXT SESSION: DN and consider Estim with this, and mobility work for neck, ease into gym equipment as well.    April Manson, PT,DPT 05/21/2023, 11:41 AM

## 2023-05-23 DIAGNOSIS — M5412 Radiculopathy, cervical region: Secondary | ICD-10-CM | POA: Diagnosis not present

## 2023-05-29 ENCOUNTER — Encounter: Payer: Self-pay | Admitting: Physical Therapy

## 2023-05-29 ENCOUNTER — Ambulatory Visit: Payer: Medicare Other | Admitting: Physical Therapy

## 2023-05-29 DIAGNOSIS — M542 Cervicalgia: Secondary | ICD-10-CM

## 2023-05-29 NOTE — Therapy (Signed)
OUTPATIENT PHYSICAL THERAPY TREATMENT   Patient Name: Joshua [name] MRN: [id number] DOB:[birth year], 77 y.o., male Today's Date: 05/29/2023  END OF SESSION:  PT End of Session - 05/29/23 1142     Visit Number 3    Number of Visits 8    Date for PT Re-Evaluation 07/12/23    Authorization Type UHC MCR    PT Start Time 1100    PT Stop Time 1145    PT Time Calculation (min) 45 min    Activity Tolerance Patient tolerated treatment well    Behavior During Therapy WFL for tasks assessed/performed             Past Medical History:  Diagnosis Date   Chronic anxiety    Chronic rhinitis    Claustrophobia    Diverticulosis of colon (without mention of hemorrhage)    DJD (degenerative joint disease)    OA AND PAIN RIGHT KNEE. DDD including fusions lumbar spine   GERD (gastroesophageal reflux disease)    ONLY WITH CERTAIN FOODS   History of total right knee replacement    Hx of vertigo    Hyperlipidemia    Hypertension    PTSD (post-traumatic stress disorder)    [place] VET[employer]   TOS (thoracic outlet syndrome)    Saw a physical therapist--PAIN RIGHT SHOULDER- MUCH IMPROVED   Past Surgical History:  Procedure Laterality Date   ANTERIOR CERVICAL DECOMP/DISCECTOMY FUSION N/A 11/30/2015   Procedure: CERVICAL SEVEN-THORACIC ONE ANTERIOR CERVICAL DISCECTOMY/FUSION;  Surgeon: Julio Sicks, MD;  Location: [facility];  Service: Neurosurgery;  Laterality: N/A;   CATARACT EXTRACTION     right in 2024[facility]   CERVICAL DISC SURGERY  2009   c3-4 5-6/ by Dr Dutch Quint- FUSION   COLONOSCOPY     TONSILLECTOMY     AS A CHILD   TOTAL KNEE ARTHROPLASTY Right 07/21/2014   Procedure: TOTAL RIGHT KNEE ARTHROPLASTY;  Surgeon: Shelda Pal, MD;  Location: [facility];  Service: Orthopedics;  Laterality: Right;   TOTAL KNEE ARTHROPLASTY Left 08/25/2019   Procedure: TOTAL KNEE ARTHROPLASTY;  Surgeon: Ollen Gross, MD;  Location: [facility];  Service: Orthopedics;  Laterality: Left;     Patient Active Problem List   Diagnosis Date Noted   Aortic atherosclerosis (HCC) 12/07/2020   S/P total knee arthroplasty, left 08/25/2019   S/P total knee arthroplasty 08/25/2019   Former smoker 03/26/2017   GERD (gastroesophageal reflux disease) 03/26/2017   Allergic rhinitis 03/26/2017   Cervical spondylosis with radiculopathy 11/30/2015   Insomnia 07/18/2015   TOS (thoracic outlet syndrome)    Cough 10/27/2008   Hyperlipidemia, unspecified 09/16/2008   Chronic rhinitis 09/16/2008   OA (osteoarthritis) of knee 09/16/2008   VERTIGO 09/16/2008    PCP: Shelva Majestic, MD   REFERRING PROVIDER: Juanda Chance, NP  REFERRING DIAG: M54.2 (ICD-10-CM) - Cervicalgia M79.18 (ICD-10-CM) - Myofascial pain syndrome M96.1 (ICD-10-CM) - Post laminectomy syndrome  THERAPY DIAG:  Cervicalgia  Rationale for Evaluation and Treatment: Rehabilitation  ONSET DATE: chronic neck pain                                          SUBJECTIVE:  SUBJECTIVE STATEMENT: 7/10 pain in neck today, MD wants him to get MRI tomorrow.  PERTINENT HISTORY: [id number] fusion C3-T1, anx, claustrophobia,DJD, vertigo, PTSD,   PAIN:  NPRS scale: 7/10 upon arrival Pain location:Rt side of his neck Pain description: constant, can light him up at times Aggravating factors: moving his neck, driving Relieving factors: rest, meds   PRECAUTIONS: None  RED FLAGS: None     WEIGHT BEARING RESTRICTIONS: No  FALLS:  Has patient fallen in last 6 months? No   PLOF: Independent  PATIENT GOALS: reduce, wants to try DN.   NEXT MD VISIT: will see surgeon 05/23/27, referring provider 06/28/23  OBJECTIVE:   DIAGNOSTIC FINDINGS:  "AP and lateral radiographs of cervical spine shows maintained cervical  lordosis intact ACDF hardware at C3-C4, C4-C5,  C5-C6 and C7-T1. Facet  arthropathy noted bilaterally above the fusion at C2-C3. No fractures or  dislocations. "  PATIENT SURVEYS:  Eval: FOTO 49% functional, goal is 62%  COGNITION: Overall cognitive status: Within functional limits for tasks assessed  PALPATION: Tender to palpation with pain rt sided cervical paraspinals and upper trap   CERVICAL ROM:   Active ROM A/PROM (deg) eval  Flexion 30  Extension 20  Right lateral flexion 7  Left lateral flexion 25  Right rotation 35  Left rotation 40   (Blank rows = not tested)  UPPER EXTREMITY ROM:   AROM Right eval Left eval  Shoulder flexion [facility]  Shoulder extension    Shoulder abduction    Shoulder adduction    Shoulder extension    Shoulder internal rotation    Shoulder external rotation    Elbow flexion    Elbow extension    Wrist flexion    Wrist extension    Wrist ulnar deviation    Wrist radial deviation    Wrist pronation    Wrist supination     (Blank rows = not tested)  UPPER EXTREMITY MMT:  MMT Right eval Left eval  Shoulder flexion 5 5  Shoulder extension    Shoulder abduction 5 5  Shoulder adduction    Shoulder extension    Shoulder internal rotation 5 5  Shoulder external rotation 4+ 4+  Middle trapezius    Lower trapezius    Elbow flexion 5 5  Elbow extension 5 5  Wrist flexion    Wrist extension    Wrist ulnar deviation    Wrist radial deviation    Wrist pronation    Wrist supination    Grip strength WFL WFL   (Blank rows = not tested)   TODAY'S TREATMENT:  05/21/23 Manual therapy for active compression and skilled palpation and Trigger Point Dry-Needling  Treatment instructions: Expect mild to moderate muscle soreness. Patient Consent Given: Yes Education handout provided: verbally provided Muscles treated: Rt sided for Cervical paraspinals and multifidi, upper traps, levator, suboccipitals combined with Estim today milli amp current with 2 frequency as well as micro  current with 100 frequency both with intensity turned up to tolerance. Treatment response/outcome: good overall tolerance,twitch response noted   Therex: Cervical rotation stretch with towel 5 sec X 10 Upper trap stretch 30 sec X 2 bilat Levator stretch 30 sec X 2 bilat     PATIENT EDUCATION: Education details: HEP, PT plan of care Person educated: Patient Education method: Explanation, Demonstration, Verbal cues, and Handouts Education comprehension: verbalized understanding and needs further education   HOME EXERCISE PROGRAM: Access Code: [email or web address] URL: [email or web address] Date: 05/17/2023 Prepared by: Ivery Quale  Exercises -  Seated Assisted Cervical Rotation with Towel  - 2 x daily - 6 x weekly - 1-2 sets - 10 reps - 5 hold - Seated Levator Scapulae Stretch  - 2 x daily - 6 x weekly - 1 sets - 3 reps - 15 hold - Seated Cervical Sidebending Stretch  - 2 x daily - 6 x weekly - 1 sets - 3 reps - 15 hold - Seated Cervical Retraction  - 2 x daily - 6 x weekly - 1 sets - 10 reps - Standing Shoulder Row with Anchored Resistance  - 2 x daily - 6 x weekly - 2-3 sets - 10 reps - Shoulder External Rotation and Scapular Retraction with Resistance  - 2 x daily - 6 x weekly - 3 sets - 10 reps  ASSESSMENT:  CLINICAL IMPRESSION:  We continued with DN treatment today but added in Estim with this to see if this gives him any additional benefit. He will have cervical MRI tomorrow.    Per eval Patient referred to PT for cervicalgia and myofascial pain syndrome . He is post op cervical fusion C3-T1". He has cervical hypomobility as well as Right neck muscular pain and tightness with trigger points. We did try DN today which did appear to provide some relief and improved ROM after. Patient will benefit from skilled PT to address below impairments, limitations and improve overall function.  OBJECTIVE IMPAIRMENTS: decreased activity tolerance, decreased neck mobility, decreased  ROM, decreased strength, impaired flexibility,  postural dysfunction, and pain.  ACTIVITY LIMITATIONS: tuning his head, lifting, driving PERSONAL FACTORS: [id number] fusion C3-T1, anx, claustrophobia,DJD, vertigo, PTSD, also affecting patient's functional outcome.  REHAB POTENTIAL: Good  CLINICAL DECISION MAKING: Stable/uncomplicated  EVALUATION COMPLEXITY: Low    GOALS: Short term PT Goals Target date: 06/14/2023   Pt will be I and compliant with HEP. Baseline:  Goal status: New Pt will decrease pain by 25% overall Baseline:7 Goal status: New  Long term PT goals Target date:07/12/2023   Pt will improve neck AROM >10 deg each plane to improve functional mobility.  Baseline: Goal status: New  Pt will improve FOTO to at least 62% functional to show improved function Baseline: Goal status: New Pt will reduce pain to overall less than 4/10 with usual activity and turning his hea Baseline:7 Goal status: New  PLAN: PT FREQUENCY: 1-2 times per week   PT DURATION: 12 weeks  PLANNED INTERVENTIONS (unless contraindicated): aquatic PT, Canalith repositioning, cryotherapy, Electrical stimulation, Iontophoresis with 4 mg/ml dexamethasome, Moist heat, traction, Ultrasound, gait training, Therapeutic exercise, balance training, neuromuscular re-education, patient/family education, prosthetic training, manual techniques, passive ROM, dry needling, taping, vasopnuematic device, vestibular, spinal manipulations, joint manipulations  PLAN FOR NEXT SESSION: look for MRI results, continue DN and combine with Estim if desired, and mobility work for neck, ease into gym equipment as well.    April Manson, PT,DPT 05/29/2023, 11:43 AM

## 2023-05-30 DIAGNOSIS — M5412 Radiculopathy, cervical region: Secondary | ICD-10-CM | POA: Diagnosis not present

## 2023-06-05 ENCOUNTER — Ambulatory Visit: Payer: Medicare Other | Admitting: Physical Therapy

## 2023-06-05 ENCOUNTER — Encounter: Payer: Self-pay | Admitting: Physical Therapy

## 2023-06-05 DIAGNOSIS — M542 Cervicalgia: Secondary | ICD-10-CM

## 2023-06-05 NOTE — Therapy (Signed)
OUTPATIENT PHYSICAL THERAPY TREATMENT   Patient Name: Joshua [name] MRN: [id number] DOB:[birth year], 77 y.o., male Today's Date: 06/05/2023  END OF SESSION:  PT End of Session - 06/05/23 1009     Visit Number 4    Number of Visits 8    Date for PT Re-Evaluation 07/12/23    Authorization Type [employer] MCR    PT Start Time 1015    PT Stop Time 1100    PT Time Calculation (min) 45 min    Activity Tolerance Patient tolerated treatment well    Behavior During Therapy WFL for tasks assessed/performed             Past Medical History:  Diagnosis Date   Chronic anxiety    Chronic rhinitis    Claustrophobia    Diverticulosis of colon (without mention of hemorrhage)    DJD (degenerative joint disease)    OA AND PAIN RIGHT KNEE. DDD including fusions lumbar spine   GERD (gastroesophageal reflux disease)    ONLY WITH CERTAIN FOODS   History of total right knee replacement    Hx of vertigo    Hyperlipidemia    Hypertension    PTSD (post-traumatic stress disorder)    [place] VETER[employer]   TOS (thoracic outlet syndrome)    Saw a physical therapist--PAIN RIGHT SHOULDER- MUCH IMPROVED   Past Surgical History:  Procedure Laterality Date   ANTERIOR CERVICAL DECOMP/DISCECTOMY FUSION N/A 11/30/2015   Procedure: CERVICAL SEVEN-THORACIC ONE ANTERIOR CERVICAL DISCECTOMY/FUSION;  Surgeon: Julio Sicks, MD;  Location: [facility];  Service: Neurosurgery;  Laterality: N/A;   CATARACT EXTRACTION     [facility] in [facility]24[facility]   CERVICAL DISC SURGERY  2009   c3-4 5-6/ by Dr Dutch Quint- FUSION   COLONOSCOPY     TONSILLECTOMY     AS A CHILD   TOTAL KNEE ARTHROPLASTY Right 07/21/2014   Procedure: TOTAL RIGHT KNEE ARTHROPLASTY;  Surgeon: Shelda Pal, MD;  Location: [facility];  Service: Orthopedics;  Laterality: Right;   TOTAL KNEE ARTHROPLASTY Left 08/25/2019   Procedure: TOTAL KNEE ARTHROPLASTY;  Surgeon: Ollen Gross, MD;  Location: [facility];  Service: Orthopedics;  Laterality: Left;     Patient Active Problem List   Diagnosis Date Noted   Aortic atherosclerosis (HCC) 12/07/2020   S/P total knee arthroplasty, left 08/25/2019   S/P total knee arthroplasty 08/25/2019   Former smoker 03/26/2017   GERD (gastroesophageal reflux disease) 03/26/2017   Allergic rhinitis 03/26/2017   Cervical spondylosis with radiculopathy 11/30/2015   Insomnia 07/18/2015   TOS (thoracic outlet syndrome)    Cough 10/27/2008   Hyperlipidemia, unspecified 09/16/2008   Chronic rhinitis 09/16/2008   OA (osteoarthritis) of knee 09/16/2008   VERTIGO 09/16/2008    PCP: Shelva Majestic, MD   REFERRING PROVIDER: Juanda Chance, NP  REFERRING DIAG: M54.2 (ICD-10-CM) - Cervicalgia M79.18 (ICD-10-CM) - Myofascial pain syndrome M96.1 (ICD-10-CM) - Post laminectomy syndrome  THERAPY DIAG:  Cervicalgia  Rationale for Evaluation and Treatment: Rehabilitation  ONSET DATE: chronic neck pain                                          SUBJECTIVE:  SUBJECTIVE STATEMENT: He has improved some with pain to a 5/10 down from 7/10. He is sleeping better, he had MRI recently but does not know any results yet and will follow up with Dr. Dutch Quint at the end of September  PERTINENT HISTORY: OAC:[id number] fusion C3-T1, anx, claustrophobia,DJD, vertigo, PTSD,   PAIN:  NPRS scale: 7/10 upon arrival Pain location:Rt side of his neck Pain description: constant, can light him up at times Aggravating factors: moving his neck, driving Relieving factors: rest, meds   PRECAUTIONS: None  RED FLAGS: None     WEIGHT BEARING RESTRICTIONS: No  FALLS:  Has patient fallen in last 6 months? No   PLOF: Independent  PATIENT GOALS: reduce, wants to try DN.   NEXT MD VISIT: will see surgeon 05/23/27, referring provider 06/28/23  OBJECTIVE:    DIAGNOSTIC FINDINGS:  "AP and lateral radiographs of cervical spine shows maintained cervical  lordosis intact ACDF hardware at C3-C4, C4-C5, C5-C6 and C7-T1. Facet  arthropathy noted bilaterally above the fusion at C2-C3. No fractures or  dislocations. "  PATIENT SURVEYS:  Eval: FOTO 49% functional, goal is 62%  COGNITION: Overall cognitive status: Within functional limits for tasks assessed  PALPATION: Tender to palpation with pain rt sided cervical paraspinals and upper trap   CERVICAL ROM:   Active ROM A/PROM (deg) eval  Flexion 30  Extension 20  Right lateral flexion 7  Left lateral flexion 25  Right rotation 35  Left rotation 40   (Blank rows = not tested)  UPPER EXTREMITY ROM:   AROM Right eval Left eval  Shoulder flexion [facility]  Shoulder extension    Shoulder abduction    Shoulder adduction    Shoulder extension    Shoulder internal rotation    Shoulder external rotation    Elbow flexion    Elbow extension    Wrist flexion    Wrist extension    Wrist ulnar deviation    Wrist radial deviation    Wrist pronation    Wrist supination     (Blank rows = not tested)  UPPER EXTREMITY MMT:  MMT Right eval Left eval  Shoulder flexion 5 5  Shoulder extension    Shoulder abduction 5 5  Shoulder adduction    Shoulder extension    Shoulder internal rotation 5 5  Shoulder external rotation 4+ 4+  Middle trapezius    Lower trapezius    Elbow flexion 5 5  Elbow extension 5 5  Wrist flexion    Wrist extension    Wrist ulnar deviation    Wrist radial deviation    Wrist pronation    Wrist supination    Grip strength WFL WFL   (Blank rows = not tested)   TODAY'S TREATMENT:  05/21/23 Manual therapy for active compression and skilled palpation and Trigger Point Dry-Needling  Treatment instructions: Expect mild to moderate muscle soreness. Patient Consent Given: Yes Education handout provided: verbally provided Muscles treated: Rt sided for  Cervical paraspinals and multifidi, upper traps, levator, suboccipitals combined with Estim today milli amp current with 2 frequency as well as micro current with 100 frequency both with intensity turned up to tolerance. Treatment response/outcome: good overall tolerance,twitch response noted   Therex: Cervical rotation stretch with towel 5 sec X 10 Upper trap stretch 10 sec X 3 bilat Levator stretch 10 sec X 3 bilat Unilat alternating rows blue X 15 bilat Shoulder extensions blue X 15 bilat     PATIENT EDUCATION: Education details: HEP, PT plan  of care Person educated: Patient Education method: Explanation, Demonstration, Verbal cues, and Handouts Education comprehension: verbalized understanding and needs further education   HOME EXERCISE PROGRAM: Access Code: [email or web address] URL: [email or web address] Date: 05/17/2023 Prepared by: Ivery Quale  Exercises - Seated Assisted Cervical Rotation with Towel  - 2 x daily - 6 x weekly - 1-2 sets - 10 reps - 5 hold - Seated Levator Scapulae Stretch  - 2 x daily - 6 x weekly - 1 sets - 3 reps - 15 hold - Seated Cervical Sidebending Stretch  - 2 x daily - 6 x weekly - 1 sets - 3 reps - 15 hold - Seated Cervical Retraction  - 2 x daily - 6 x weekly - 1 sets - 10 reps - Standing Shoulder Row with Anchored Resistance  - 2 x daily - 6 x weekly - 2-3 sets - 10 reps - Shoulder External Rotation and Scapular Retraction with Resistance  - 2 x daily - 6 x weekly - 3 sets - 10 reps  ASSESSMENT:  CLINICAL IMPRESSION: he had some improvements in overall pain and sleeping from the addition of E stim with the DN treatment so this was continued again today with stretching program to increase ROM.    Per eval Patient referred to PT for cervicalgia and myofascial pain syndrome . He is post op cervical fusion C3-T1". He has cervical hypomobility as well as Right neck muscular pain and tightness with trigger points. We did try DN today which did  appear to provide some relief and improved ROM after. Patient will benefit from skilled PT to address below impairments, limitations and improve overall function.  OBJECTIVE IMPAIRMENTS: decreased activity tolerance, decreased neck mobility, decreased ROM, decreased strength, impaired flexibility,  postural dysfunction, and pain.  ACTIVITY LIMITATIONS: tuning his head, lifting, driving PERSONAL FACTORS: [id number] fusion C3-T1, anx, claustrophobia,DJD, vertigo, PTSD, also affecting patient's functional outcome.  REHAB POTENTIAL: Good  CLINICAL DECISION MAKING: Stable/uncomplicated  EVALUATION COMPLEXITY: Low    GOALS: Short term PT Goals Target date: 06/14/2023   Pt will be I and compliant with HEP. Baseline:  Goal status: MET 06/05/23 Pt will decrease pain by 25% overall Baseline:7 Goal status: MET 06/05/23  Long term PT goals Target date:07/12/2023   Pt will improve neck AROM >10 deg each plane to improve functional mobility.  Baseline: Goal status: New  Pt will improve FOTO to at least 62% functional to show improved function Baseline: Goal status: New Pt will reduce pain to overall less than 4/10 with usual activity and turning his hea Baseline:7 Goal status: New  PLAN: PT FREQUENCY: 1-2 times per week   PT DURATION: 12 weeks  PLANNED INTERVENTIONS (unless contraindicated): aquatic PT, Canalith repositioning, cryotherapy, Electrical stimulation, Iontophoresis with 4 mg/ml dexamethasome, Moist heat, traction, Ultrasound, gait training, Therapeutic exercise, balance training, neuromuscular re-education, patient/family education, prosthetic training, manual techniques, passive ROM, dry needling, taping, vasopnuematic device, vestibular, spinal manipulations, joint manipulations  PLAN FOR NEXT SESSION: look for MRI results, continue DN and combine with Estim if desired, and mobility work for neck, ease into gym equipment as well.   will follow up with Dr. Dutch Quint at the  end of september   April Manson, PT,DPT 06/05/2023, 10:09 AM

## 2023-06-12 ENCOUNTER — Encounter: Payer: Self-pay | Admitting: Physical Therapy

## 2023-06-12 ENCOUNTER — Ambulatory Visit: Payer: Medicare Other | Admitting: Physical Therapy

## 2023-06-12 DIAGNOSIS — M542 Cervicalgia: Secondary | ICD-10-CM

## 2023-06-12 NOTE — Therapy (Signed)
OUTPATIENT PHYSICAL THERAPY TREATMENT   Patient Name: Joshua [name] MRN: [id number] DOB:[birth year], 77 y.o., male Today's Date: 06/12/2023  END OF SESSION:  PT End of Session - 06/12/23 0959     Visit Number 5    Number of Visits 8    Date for PT Re-Evaluation 07/12/23    Authorization Type UHC MCR    PT Start Time 0959    PT Stop Time 1044    PT Time Calculation (min) 45 min    Activity Tolerance Patient tolerated treatment well    Behavior During Therapy WFL for tasks assessed/performed             Past Medical History:  Diagnosis Date   Chronic anxiety    Chronic rhinitis    Claustrophobia    Diverticulosis of colon (without mention of hemorrhage)    DJD (degenerative joint disease)    OA AND PAIN RIGHT KNEE. DDD including fusions lumbar spine   GERD (gastroesophageal reflux disease)    ONLY WITH CERTAIN FOODS   History of total right knee replacement    Hx of vertigo    Hyperlipidemia    Hypertension    PTSD (post-traumatic stress disorder)    [place] VET[employer]   TOS (thoracic outlet syndrome)    Saw a physical therapist--PAIN RIGHT SHOULDER- MUCH IMPROVED   Past Surgical History:  Procedure Laterality Date   ANTERIOR CERVICAL DECOMP/DISCECTOMY FUSION N/A 11/30/2015   Procedure: CERVICAL SEVEN-THORACIC ONE ANTERIOR CERVICAL DISCECTOMY/FUSION;  Surgeon: Julio Sicks, MD;  Location: [facility];  Service: Neurosurgery;  Laterality: N/A;   CATARACT EXTRACTION     right in 2024[facility]   CERVICAL DISC SURGERY  2009   c3-4 5-6/ by Dr Dutch Quint- FUSION   COLONOSCOPY     TONSILLECTOMY     AS A CHILD   TOTAL KNEE ARTHROPLASTY Right 07/21/2014   Procedure: TOTAL RIGHT KNEE ARTHROPLASTY;  Surgeon: Shelda Pal, MD;  Location: [facility];  Service: Orthopedics;  Laterality: Right;   TOTAL KNEE ARTHROPLASTY Left 08/25/2019   Procedure: TOTAL KNEE ARTHROPLASTY;  Surgeon: Ollen Gross, MD;  Location: [facility];  Service: Orthopedics;  Laterality: Left;     Patient Active Problem List   Diagnosis Date Noted   Aortic atherosclerosis (HCC) 12/07/2020   S/P total knee arthroplasty, left 08/25/2019   S/P total knee arthroplasty 08/25/2019   Former smoker 03/26/2017   GERD (gastroesophageal reflux disease) 03/26/2017   Allergic rhinitis 03/26/2017   Cervical spondylosis with radiculopathy 11/30/2015   Insomnia 07/18/2015   TOS (thoracic outlet syndrome)    Cough 10/27/2008   Hyperlipidemia, unspecified 09/16/2008   Chronic rhinitis 09/16/2008   OA (osteoarthritis) of knee 09/16/2008   VERTIGO 09/16/2008    PCP: Shelva Majestic, MD   REFERRING PROVIDER: Juanda Chance, NP  REFERRING DIAG: M54.2 (ICD-10-CM) - Cervicalgia M79.18 (ICD-10-CM) - Myofascial pain syndrome M96.1 (ICD-10-CM) - Post laminectomy syndrome  THERAPY DIAG:  Cervicalgia  Rationale for Evaluation and Treatment: Rehabilitation  ONSET DATE: chronic neck pain                                          SUBJECTIVE:  SUBJECTIVE STATEMENT: He says pain continues to be lower, around 5/10 versus the 7/10 it was. He will follow up with Dr. Dutch Quint Thursday about neck MRI resutls PERTINENT HISTORY: [id number] fusion C3-T1, anx, claustrophobia,DJD, vertigo, PTSD,   PAIN:  NPRS scale: 7/10 upon arrival Pain location:Rt side of his neck Pain description: constant, can light him up at times Aggravating factors: moving his neck, driving Relieving factors: rest, meds   PRECAUTIONS: None  RED FLAGS: None     WEIGHT BEARING RESTRICTIONS: No  FALLS:  Has patient fallen in last 6 months? No   PLOF: Independent  PATIENT GOALS: reduce, wants to try DN.   NEXT MD VISIT: will see surgeon 05/23/27, referring provider 06/28/23  OBJECTIVE:   DIAGNOSTIC FINDINGS:  "AP and lateral radiographs of  cervical spine shows maintained cervical  lordosis intact ACDF hardware at C3-C4, C4-C5, C5-C6 and C7-T1. Facet  arthropathy noted bilaterally above the fusion at C2-C3. No fractures or  dislocations. "  PATIENT SURVEYS:  Eval: FOTO 49% functional, goal is 62%  COGNITION: Overall cognitive status: Within functional limits for tasks assessed  PALPATION: Tender to palpation with pain rt sided cervical paraspinals and upper trap   CERVICAL ROM:   Active ROM A/PROM (deg) eval  Flexion 30  Extension 20  Right lateral flexion 7  Left lateral flexion 25  Right rotation 35  Left rotation 40   (Blank rows = not tested)  UPPER EXTREMITY ROM:   AROM Right eval Left eval  Shoulder flexion [facility]  Shoulder extension    Shoulder abduction    Shoulder adduction    Shoulder extension    Shoulder internal rotation    Shoulder external rotation    Elbow flexion    Elbow extension    Wrist flexion    Wrist extension    Wrist ulnar deviation    Wrist radial deviation    Wrist pronation    Wrist supination     (Blank rows = not tested)  UPPER EXTREMITY MMT:  MMT Right eval Left eval  Shoulder flexion 5 5  Shoulder extension    Shoulder abduction 5 5  Shoulder adduction    Shoulder extension    Shoulder internal rotation 5 5  Shoulder external rotation 4+ 4+  Middle trapezius    Lower trapezius    Elbow flexion 5 5  Elbow extension 5 5  Wrist flexion    Wrist extension    Wrist ulnar deviation    Wrist radial deviation    Wrist pronation    Wrist supination    Grip strength WFL WFL   (Blank rows = not tested)   TODAY'S TREATMENT:  06/12/23 Manual therapy for active compression and skilled palpation and Trigger Point Dry-Needling  Treatment instructions: Expect mild to moderate muscle soreness. Patient Consent Given: Yes Education handout provided: verbally provided Muscles treated: Rt sided for Cervical paraspinals and multifidi, upper traps, levator,  suboccipitals combined with Estim today milli amp current with 2 frequency as well as micro current with 100 frequency both with intensity turned up to tolerance. Treatment response/outcome: good overall tolerance,twitch response noted   Therex: Cervical rotation stretch with towel 5 sec X 10 Upper trap stretch 20 sec X 3 bilat Levator stretch 20 sec X 3 bilat Row machine 35# X 20 Lat pulldown machine 35# X 20 Chest press machine 35# X 20     PATIENT EDUCATION: Education details: HEP, PT plan of care Person educated: Patient Education method: Explanation, Demonstration, Verbal  cues, and Handouts Education comprehension: verbalized understanding and needs further education   HOME EXERCISE PROGRAM: Access Code: [email or web address] URL: [email or web address] Date: 05/17/2023 Prepared by: Ivery Quale  Exercises - Seated Assisted Cervical Rotation with Towel  - 2 x daily - 6 x weekly - 1-2 sets - 10 reps - 5 hold - Seated Levator Scapulae Stretch  - 2 x daily - 6 x weekly - 1 sets - 3 reps - 15 hold - Seated Cervical Sidebending Stretch  - 2 x daily - 6 x weekly - 1 sets - 3 reps - 15 hold - Seated Cervical Retraction  - 2 x daily - 6 x weekly - 1 sets - 10 reps - Standing Shoulder Row with Anchored Resistance  - 2 x daily - 6 x weekly - 2-3 sets - 10 reps - Shoulder External Rotation and Scapular Retraction with Resistance  - 2 x daily - 6 x weekly - 3 sets - 10 reps  ASSESSMENT:  CLINICAL IMPRESSION: He continues to relay improvements from DN combined with Estim and PT stretching/strengthening program so we continued with same treatment. He will follow up with MD Thursday.   Per eval Patient referred to PT for cervicalgia and myofascial pain syndrome . He is post op cervical fusion C3-T1". He has cervical hypomobility as well as Right neck muscular pain and tightness with trigger points. We did try DN today which did appear to provide some relief and improved ROM after.  Patient will benefit from skilled PT to address below impairments, limitations and improve overall function.  OBJECTIVE IMPAIRMENTS: decreased activity tolerance, decreased neck mobility, decreased ROM, decreased strength, impaired flexibility,  postural dysfunction, and pain.  ACTIVITY LIMITATIONS: tuning his head, lifting, driving PERSONAL FACTORS: [id number] fusion C3-T1, anx, claustrophobia,DJD, vertigo, PTSD, also affecting patient's functional outcome.  REHAB POTENTIAL: Good  CLINICAL DECISION MAKING: Stable/uncomplicated  EVALUATION COMPLEXITY: Low    GOALS: Short term PT Goals Target date: 06/14/2023   Pt will be I and compliant with HEP. Baseline:  Goal status: MET 06/05/23 Pt will decrease pain by 25% overall Baseline:7 Goal status: MET 06/05/23  Long term PT goals Target date:07/12/2023   Pt will improve neck AROM >10 deg each plane to improve functional mobility.  Baseline: Goal status: New  Pt will improve FOTO to at least 62% functional to show improved function Baseline: Goal status: New Pt will reduce pain to overall less than 4/10 with usual activity and turning his hea Baseline:7 Goal status: New  PLAN: PT FREQUENCY: 1-2 times per week   PT DURATION: 12 weeks  PLANNED INTERVENTIONS (unless contraindicated): aquatic PT, Canalith repositioning, cryotherapy, Electrical stimulation, Iontophoresis with 4 mg/ml dexamethasome, Moist heat, traction, Ultrasound, gait training, Therapeutic exercise, balance training, neuromuscular re-education, patient/family education, prosthetic training, manual techniques, passive ROM, dry needling, taping, vasopnuematic device, vestibular, spinal manipulations, joint manipulations  PLAN FOR NEXT SESSION: What did MD say? Update ROM measurments will follow up with Dr. Dutch Quint at the end of september   April Manson, PT,DPT 06/12/2023, 10:43 AM

## 2023-06-14 DIAGNOSIS — M5412 Radiculopathy, cervical region: Secondary | ICD-10-CM | POA: Diagnosis not present

## 2023-06-18 ENCOUNTER — Telehealth: Payer: Self-pay | Admitting: Physical Medicine and Rehabilitation

## 2023-06-18 NOTE — Telephone Encounter (Signed)
Spoke with patient today, he is doing much better since our visit on 05/09/2023. He continues PT with our practice. He is also being treated by Dr. Jordan Likes and is considering chronic pain management. He will call us as needed.

## 2023-06-19 ENCOUNTER — Encounter: Payer: Self-pay | Admitting: Physical Therapy

## 2023-06-19 ENCOUNTER — Ambulatory Visit: Payer: Medicare Other | Admitting: Physical Therapy

## 2023-06-19 DIAGNOSIS — M542 Cervicalgia: Secondary | ICD-10-CM

## 2023-06-19 DIAGNOSIS — M5412 Radiculopathy, cervical region: Secondary | ICD-10-CM | POA: Diagnosis not present

## 2023-06-19 NOTE — Therapy (Signed)
OUTPATIENT PHYSICAL THERAPY TREATMENT/RECERT/Progress note Progress Note reporting period 05/17/23 to 06/19/23  See below for objective and subjective measurements relating to patients progress with PT.  Date of referral: 05/09/23 Referring provider: Juanda Chance, NP  Referring diagnosis?  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 (if different than referring diagnosis)  M54.2   What was this (referring dx) caused by? Surgery (Type: Cervical Fusion), Ongoing Issue, and Arthritis  Nature of Condition: Chronic (continuous duration > 3 months)   Laterality: Rt  Current Functional Measure Score: FOTO 49% functional, goal is 62%  Objective measurements identify impairments when they are compared to normal values, the uninvolved extremity, and prior level of function.  [x]  Yes  []  No  Objective assessment of functional ability: Moderate functional limitations   Briefly describe symptoms: Pain and tightness in Right side of his neck, post laminectomy syndrome and history of cervical fusion C3-T1  How did symptoms start: Post surgical  Average pain intensity:  Last 24 hours: 5/10  Past week: 5-7/10  How often does the pt experience symptoms? Constantly  How much have the symptoms interfered with usual daily activities? Quite a bit  How has condition changed since care began at this facility? A little better  In general, how is the patients overall health? Good       Patient Name: Joshua [name] MRN: [id number] DOB:Nov 06, 1945, 77 y.o., male Today's Date: 06/19/2023  END OF SESSION:  PT End of Session - 06/19/23 1141     Visit Number 6    Number of Visits 15    Date for PT Re-Evaluation 08/14/23    Authorization Type UHC MCR    Progress Note Due on Visit 16    PT Start Time 1020    PT Stop Time 1100    PT Time Calculation (min) 40 min    Activity  Tolerance Patient tolerated treatment well    Behavior During Therapy WFL for tasks assessed/performed             Past Medical History:  Diagnosis Date   Chronic anxiety    Chronic rhinitis    Claustrophobia    Diverticulosis of colon (without mention of hemorrhage)    DJD (degenerative joint disease)    OA AND PAIN RIGHT KNEE. DDD including fusions lumbar spine   GERD (gastroesophageal reflux disease)    ONLY WITH CERTAIN FOODS   History of total right knee replacement    Hx of vertigo    Hyperlipidemia    Hypertension    PTSD (post-traumatic stress disorder)    [place] VET[employer]   TOS (thoracic outlet syndrome)    Saw a physical therapist--PAIN RIGHT SHOULDER- MUCH IMPROVED   Past Surgical History:  Procedure Laterality Date   ANTERIOR CERVICAL DECOMP/DISCECTOMY FUSION N/A 11/30/2015   Procedure: CERVICAL SEVEN-THORACIC ONE ANTERIOR CERVICAL DISCECTOMY/FUSION;  Surgeon: Julio Sicks, MD;  Location: [facility];  Service: Neurosurgery;  Laterality: N/A;   CATARACT EXTRACTION     [facility] in [facility]24[facility]   CERVICAL DISC SURGERY  2009   c3-4 5-6/ by Dr Dutch Quint- FUSION   COLONOSCOPY     TONSILLECTOMY     AS A CHILD   TOTAL KNEE ARTHROPLASTY Right 07/21/2014   Procedure: TOTAL RIGHT KNEE ARTHROPLASTY;  Surgeon: Shelda Pal, MD;  Location: [facility];  Service: Orthopedics;  Laterality: Right;   TOTAL KNEE ARTHROPLASTY Left 08/25/2019  Procedure: TOTAL KNEE ARTHROPLASTY;  Surgeon: Ollen Gross, MD;  Location: [facility];  Service: Orthopedics;  Laterality: Left;    Patient Active Problem List   Diagnosis Date Noted   Aortic atherosclerosis (HCC) 12/07/2020   S/P total knee arthroplasty, left 08/25/2019   S/P total knee arthroplasty 08/25/2019   Former smoker 03/26/2017   GERD (gastroesophageal reflux disease) 03/26/2017   Allergic rhinitis 03/26/2017   Cervical spondylosis with radiculopathy 11/30/2015   Insomnia 07/18/2015   TOS (thoracic outlet  syndrome)    Cough 10/27/2008   Hyperlipidemia, unspecified 09/16/2008   Chronic rhinitis 09/16/2008   OA (osteoarthritis) of knee 09/16/2008   VERTIGO 09/16/2008    PCP: Shelva Majestic, MD   REFERRING PROVIDER: Juanda Chance, NP  REFERRING DIAG: M54.2 (ICD-10-CM) - Cervicalgia M79.18 (ICD-10-CM) - Myofascial pain syndrome M96.1 (ICD-10-CM) - Post laminectomy syndrome  THERAPY DIAG:  Cervicalgia  Rationale for Evaluation and Treatment: Rehabilitation  ONSET DATE: chronic neck pain                                          SUBJECTIVE:                                                                                                                                                                 SUBJECTIVE STATEMENT: He says MD does not think he needs surgery. He does feel PT is helping with the pain and wants to continue. He did return to gym for the first time yesterday.  PERTINENT HISTORY: [id number] fusion C3-T1, anx, claustrophobia,DJD, vertigo, PTSD,   PAIN:  NPRS scale: 5/10 upon arrival Pain location:Rt side of his neck Pain description: constant, can light him up at times Aggravating factors: moving his neck, driving Relieving factors: rest, meds   PRECAUTIONS: None  RED FLAGS: None     WEIGHT BEARING RESTRICTIONS: No  FALLS:  Has patient fallen in last 6 months? No   PLOF: Independent  PATIENT GOALS: reduce, wants to try DN.   NEXT MD VISIT: will see surgeon 05/22/77, referring provider 06/28/23  OBJECTIVE:   DIAGNOSTIC FINDINGS:  "AP and lateral radiographs of cervical spine shows maintained cervical  lordosis intact ACDF hardware at C3-C4, C4-C5, C5-C6 and C7-T1. Facet  arthropathy noted bilaterally above the fusion at C2-C3. No fractures or  dislocations. "  PATIENT SURVEYS:  Eval: FOTO 49% functional, goal is 62%  COGNITION: Overall cognitive status: Within functional limits for tasks assessed  PALPATION: Tender to palpation with  pain rt sided cervical paraspinals and upper trap   CERVICAL ROM:   Active ROM A/PROM (deg) eval AROM  Flexion 30   Extension 20  Right lateral flexion 7 10  Left lateral flexion 25   Right rotation 35 45  Left rotation 40 50   (Blank rows = not tested)  UPPER EXTREMITY ROM:   AROM Right eval Left eval  Shoulder flexion [facility]  Shoulder extension    Shoulder abduction    Shoulder adduction    Shoulder extension    Shoulder internal rotation    Shoulder external rotation    Elbow flexion    Elbow extension    Wrist flexion    Wrist extension    Wrist ulnar deviation    Wrist radial deviation    Wrist pronation    Wrist supination     (Blank rows = not tested)  UPPER EXTREMITY MMT:  MMT Right eval Left eval  Shoulder flexion 5 5  Shoulder extension    Shoulder abduction 5 5  Shoulder adduction    Shoulder extension    Shoulder internal rotation 5 5  Shoulder external rotation 4+ 4+  Middle trapezius    Lower trapezius    Elbow flexion 5 5  Elbow extension 5 5  Wrist flexion    Wrist extension    Wrist ulnar deviation    Wrist radial deviation    Wrist pronation    Wrist supination    Grip strength WFL WFL   (Blank rows = not tested)   TODAY'S TREATMENT:  06/19/23 Manual therapy for active compression and skilled palpation and Trigger Point Dry-Needling  Treatment instructions: Expect mild to moderate muscle soreness. Patient Consent Given: Yes Education handout provided: verbally provided Muscles treated: Rt sided for Cervical paraspinals and multifidi, upper traps, levator, suboccipitals combined with Estim today milli amp current with 2 frequency with intensity turned up to tolerance. Treatment response/outcome: good overall tolerance,twitch response noted   Therex: Cervical rotation stretch with towel 5 sec X 10 Row machine 35# X 20 Lat pulldown machine 35# X 20 Chest press machine 35# X 20     PATIENT EDUCATION: Education details:  HEP, PT plan of care Person educated: Patient Education method: Explanation, Demonstration, Verbal cues, and Handouts Education comprehension: verbalized understanding and needs further education   HOME EXERCISE PROGRAM: Access Code: [email or web address] URL: [email or web address] Date: 05/17/2023 Prepared by: Ivery Quale  Exercises - Seated Assisted Cervical Rotation with Towel  - 2 x daily - 6 x weekly - 1-2 sets - 10 reps - 5 hold - Seated Levator Scapulae Stretch  - 2 x daily - 6 x weekly - 1 sets - 3 reps - 15 hold - Seated Cervical Sidebending Stretch  - 2 x daily - 6 x weekly - 1 sets - 3 reps - 15 hold - Seated Cervical Retraction  - 2 x daily - 6 x weekly - 1 sets - 10 reps - Standing Shoulder Row with Anchored Resistance  - 2 x daily - 6 x weekly - 2-3 sets - 10 reps - Shoulder External Rotation and Scapular Retraction with Resistance  - 2 x daily - 6 x weekly - 3 sets - 10 reps  ASSESSMENT:  CLINICAL IMPRESSION: Recert performed today to extend his PT plan of care. He is improving with PT but still limited with some pain and neck stiffness. He does relay improvements from DN combined with Estim and PT stretching/strengthening program so we continued with same treatment. He has met his short term PT goals but not quite met his long term goals and will continue to benefit from skilled PT.  Per eval Patient  referred to PT for cervicalgia and myofascial pain syndrome . He is post op cervical fusion C3-T1". He has cervical hypomobility as well as Right neck muscular pain and tightness with trigger points. We did try DN today which did appear to provide some relief and improved ROM after. Patient will benefit from skilled PT to address below impairments, limitations and improve overall function.  OBJECTIVE IMPAIRMENTS: decreased activity tolerance, decreased neck mobility, decreased ROM, decreased strength, impaired flexibility,  postural dysfunction, and pain.  ACTIVITY  LIMITATIONS: tuning his head, lifting, driving PERSONAL FACTORS: [id number] fusion C3-T1, anx, claustrophobia,DJD, vertigo, PTSD, also affecting patient's functional outcome.  REHAB POTENTIAL: Good  CLINICAL DECISION MAKING: Stable/uncomplicated  EVALUATION COMPLEXITY: Low    GOALS: Short term PT Goals Target date: 06/14/2023   Pt will be I and compliant with HEP. Baseline:  Goal status: MET 06/05/23 Pt will decrease pain by 25% overall Baseline:7 Goal status: MET 06/05/23  Long term PT goals Target date:08/14/2023   Pt will improve neck AROM >10 deg each plane to improve functional mobility.  Baseline: Goal status: New  Pt will improve FOTO to at least 62% functional to show improved function Baseline: Goal status: New Pt will reduce pain to overall less than 4/10 with usual activity and turning his hea Baseline:7 Goal status: New  PLAN: PT FREQUENCY: 1-2 times per week   PT DURATION: 8 weeks  PLANNED INTERVENTIONS (unless contraindicated): aquatic PT, Canalith repositioning, cryotherapy, Electrical stimulation, Iontophoresis with 4 mg/ml dexamethasome, Moist heat, traction, Ultrasound, gait training, Therapeutic exercise, balance training, neuromuscular re-education, patient/family education, prosthetic training, manual techniques, passive ROM, dry needling, taping, vasopnuematic device, vestibular, spinal manipulations, joint manipulations  PLAN FOR NEXT SESSION: look for new insurance authorization. Continue with DN combined with Estim and ROM.   April Manson, PT,DPT 06/19/2023, 11:55 AM

## 2023-06-25 DIAGNOSIS — M47812 Spondylosis without myelopathy or radiculopathy, cervical region: Secondary | ICD-10-CM | POA: Diagnosis not present

## 2023-06-28 ENCOUNTER — Ambulatory Visit: Payer: Medicare Other | Admitting: Physical Therapy

## 2023-06-28 ENCOUNTER — Ambulatory Visit: Payer: Medicare Other | Admitting: Physical Medicine and Rehabilitation

## 2023-06-28 ENCOUNTER — Encounter: Payer: Self-pay | Admitting: Physical Therapy

## 2023-06-28 DIAGNOSIS — M542 Cervicalgia: Secondary | ICD-10-CM | POA: Diagnosis not present

## 2023-06-28 NOTE — Therapy (Signed)
OUTPATIENT PHYSICAL THERAPY TREATMENT  Date of referral: 05/09/23 Referring provider: Juanda Chance, NP  Referring diagnosis?  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 (if different than referring diagnosis)  M54.2   What was this (referring dx) caused by? Surgery (Type: Cervical Fusion), Ongoing Issue, and Arthritis  Nature of Condition: Chronic (continuous duration > 3 months)   Laterality: Rt  Current Functional Measure Score: FOTO 49% functional, goal is 62%  Objective measurements identify impairments when they are compared to normal values, the uninvolved extremity, and prior level of function.  [x]  Yes  []  No  Objective assessment of functional ability: Moderate functional limitations   Briefly describe symptoms: Pain and tightness in Right side of his neck, post laminectomy syndrome and history of cervical fusion C3-T1  How did symptoms start: Post surgical  Average pain intensity:  Last 24 hours: 5/10  Past week: 5-7/10  How often does the pt experience symptoms? Constantly  How much have the symptoms interfered with usual daily activities? Quite a bit  How has condition changed since care began at this facility? A little better  In general, how is the patients overall health? Good       Patient Name: Joshua [name] MRN: [id number] DOB:July 31, 1946, 77 y.o., male Today's Date: 06/28/2023  END OF SESSION:  PT End of Session - 06/28/23 1134     Visit Number 7    Number of Visits 15    Date for PT Re-Evaluation 08/14/23    Authorization Type UHC MCR    Authorization Time Period 8 visits 9/24 to 10/22    Authorization - Visit Number 2    Authorization - Number of Visits 8    Progress Note Due on Visit 16    PT Start Time 1100    PT Stop Time 1145    PT Time Calculation (min) 45 min    Activity Tolerance Patient tolerated treatment well     Behavior During Therapy WFL for tasks assessed/performed             Past Medical History:  Diagnosis Date   Chronic anxiety    Chronic rhinitis    Claustrophobia    Diverticulosis of colon (without mention of hemorrhage)    DJD (degenerative joint disease)    OA AND PAIN RIGHT KNEE. DDD including fusions lumbar spine   GERD (gastroesophageal reflux disease)    ONLY WITH CERTAIN FOODS   History of total right knee replacement    Hx of vertigo    Hyperlipidemia    Hypertension    PTSD (post-traumatic stress disorder)    [place] [employer]   TOS (thoracic outlet syndrome)    Saw a physical therapist--PAIN RIGHT SHOULDER- MUCH IMPROVED   Past Surgical History:  Procedure Laterality Date   ANTERIOR CERVICAL DECOMP/DISCECTOMY FUSION N/A 11/30/2015   Procedure: CERVICAL SEVEN-THORACIC ONE ANTERIOR CERVICAL DISCECTOMY/FUSION;  Surgeon: Julio Sicks, MD;  Location: [facility];  Service: Neurosurgery;  Laterality: N/A;   CATARACT EXTRACTION     [facility] in [facility]24[facility]   CERVICAL DISC SURGERY  2009   c3-4 5-6/ by Dr Dutch Quint- FUSION   COLONOSCOPY     TONSILLECTOMY     AS A CHILD   TOTAL KNEE ARTHROPLASTY Right 07/21/2014   Procedure: TOTAL RIGHT KNEE ARTHROPLASTY;  Surgeon: Shelda Pal, MD;  Location: [facility];  Service: Orthopedics;  Laterality: Right;  TOTAL KNEE ARTHROPLASTY Left 08/25/2019   Procedure: TOTAL KNEE ARTHROPLASTY;  Surgeon: Ollen Gross, MD;  Location: [facility];  Service: Orthopedics;  Laterality: Left;    Patient Active Problem List   Diagnosis Date Noted   Aortic atherosclerosis (HCC) 12/07/2020   S/P total knee arthroplasty, left 08/25/2019   S/P total knee arthroplasty 08/25/2019   Former smoker 03/26/2017   GERD (gastroesophageal reflux disease) 03/26/2017   Allergic rhinitis 03/26/2017   Cervical spondylosis with radiculopathy 11/30/2015   Insomnia 07/18/2015   TOS (thoracic outlet syndrome)    Cough 10/27/2008   Hyperlipidemia,  unspecified 09/16/2008   Chronic rhinitis 09/16/2008   OA (osteoarthritis) of knee 09/16/2008   VERTIGO 09/16/2008    PCP: Shelva Majestic, MD   REFERRING PROVIDER: Juanda Chance, NP  REFERRING DIAG: M54.2 (ICD-10-CM) - Cervicalgia M79.18 (ICD-10-CM) - Myofascial pain syndrome 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  PERTINENT HISTORY: [id number] fusion C3-T1, anx, claustrophobia,DJD, vertigo, PTSD,   PAIN:  NPRS scale: 4/10 upon arrival Pain location:Rt side of his neck Pain description: constant, can light him up at times Aggravating factors: moving his neck, driving Relieving factors: rest, meds   PRECAUTIONS: None  RED FLAGS: None     WEIGHT BEARING RESTRICTIONS: No  FALLS:  Has patient fallen in last 6 months? No   PLOF: Independent  PATIENT GOALS: reduce, wants to try DN.   NEXT MD VISIT: will see surgeon 78/28/28, referring provider 06/28/23  OBJECTIVE:   DIAGNOSTIC FINDINGS:  "AP and lateral radiographs of cervical spine shows maintained cervical  lordosis intact ACDF hardware at C3-C4, C4-C5, C5-C6 and C7-T1. Facet  arthropathy noted bilaterally above the fusion at C2-C3. No fractures or  dislocations. "  PATIENT SURVEYS:  Eval: FOTO 49% functional, goal is 62%  COGNITION: Overall cognitive status: Within functional limits for tasks assessed  PALPATION: Tender to palpation with pain rt sided cervical paraspinals and upper trap   CERVICAL ROM:    Active ROM A/PROM (deg) eval AROM  Flexion 30   Extension 20  Right lateral flexion 7 10  Left lateral flexion 25   Right rotation 35 45  Left rotation 40 50   (Blank rows = not tested)  UPPER EXTREMITY ROM:   AROM Right eval Left eval  Shoulder flexion [facility]  Shoulder extension    Shoulder abduction    Shoulder adduction    Shoulder extension    Shoulder internal rotation    Shoulder external rotation    Elbow flexion    Elbow extension    Wrist flexion    Wrist extension    Wrist ulnar deviation    Wrist radial deviation    Wrist pronation    Wrist supination     (Blank rows = not tested)  UPPER EXTREMITY MMT:  MMT Right eval Left eval  Shoulder flexion 5 5  Shoulder extension    Shoulder abduction 5 5  Shoulder adduction    Shoulder extension    Shoulder internal rotation 5 5  Shoulder external rotation 4+ 4+  Middle trapezius    Lower trapezius    Elbow flexion 5 5  Elbow extension 5 5  Wrist flexion    Wrist extension    Wrist ulnar deviation    Wrist radial deviation    Wrist pronation    Wrist supination    Grip strength WFL WFL   (Blank rows = not tested)   TODAY'S TREATMENT:  06/28/23 Manual therapy for active compression and skilled palpation and Trigger Point Dry-Needling  Treatment instructions: Expect mild to moderate muscle soreness. Patient Consent Given: Yes Education handout provided: verbally provided Muscles treated: Rt sided for Cervical paraspinals and multifidi, upper traps, levator, suboccipitals combined with Estim today milli amp current with 2 frequency with intensity turned up to tolerance. Treatment response/outcome: good overall tolerance,twitch response noted   Therex: Cervical rotation stretch with towel 5 sec X 10 Row machine 55# X 20 Lat pulldown machine 45# X 20 Chest press machine 45# X 20 Shoulder flexion 5# 2X10 bilat Shoulder abduction 5# 2X10 bilat  06/19/23 Manual therapy for active compression  and skilled palpation and Trigger Point Dry-Needling  Treatment instructions: Expect mild to moderate muscle soreness. Patient Consent Given: Yes Education handout provided: verbally provided Muscles treated: Rt sided for Cervical paraspinals and multifidi, upper traps, levator, suboccipitals combined with Estim today milli amp current with 2 frequency with intensity turned up to tolerance. Treatment response/outcome: good overall tolerance,twitch response noted   Therex: Cervical rotation stretch with towel 5 sec X 10 Row machine 35# X 20 Lat pulldown machine 35# X 20 Chest press machine 35# X 20     PATIENT EDUCATION: Education details: HEP, PT plan of care Person educated: Patient Education method: Explanation, Demonstration, Verbal cues, and Handouts Education comprehension: verbalized understanding and needs further education   HOME EXERCISE PROGRAM: Access Code: [email or web address] URL: [email or web address] Date: 05/17/2023 Prepared by: Ivery Quale  Exercises - Seated Assisted Cervical Rotation with Towel  - 2 x daily - 6 x weekly - 1-2 sets - 10 reps - 5 hold - Seated Levator Scapulae Stretch  - 2 x daily - 6 x weekly - 1 sets - 3 reps - 15 hold - Seated Cervical Sidebending Stretch  - 2 x daily - 6 x weekly - 1 sets - 3 reps - 15 hold - Seated Cervical Retraction  - 2 x daily - 6 x weekly - 1 sets - 10 reps - Standing Shoulder Row with Anchored Resistance  - 2 x daily - 6 x  weekly - 2-3 sets - 10 reps - Shoulder External Rotation and Scapular Retraction with Resistance  - 2 x daily - 6 x weekly - 3 sets - 10 reps  ASSESSMENT:  CLINICAL IMPRESSION: He had recent neck injection Monday so overall feeling better and able to progress his strength program today with good overall tolerance.   Per eval Patient referred to PT for cervicalgia and myofascial pain syndrome . He is post op cervical fusion C3-T1". He has cervical hypomobility as well as Right neck muscular  pain and tightness with trigger points. We did try DN today which did appear to provide some relief and improved ROM after. Patient will benefit from skilled PT to address below impairments, limitations and improve overall function.  OBJECTIVE IMPAIRMENTS: decreased activity tolerance, decreased neck mobility, decreased ROM, decreased strength, impaired flexibility,  postural dysfunction, and pain.  ACTIVITY LIMITATIONS: tuning his head, lifting, driving PERSONAL FACTORS: [id number] fusion C3-T1, anx, claustrophobia,DJD, vertigo, PTSD, also affecting patient's functional outcome.  REHAB POTENTIAL: Good  CLINICAL DECISION MAKING: Stable/uncomplicated  EVALUATION COMPLEXITY: Low    GOALS: Short term PT Goals Target date: 06/14/2023   Pt will be I and compliant with HEP. Baseline:  Goal status: MET 06/05/23 Pt will decrease pain by 25% overall Baseline:7 Goal status: MET 06/05/23  Long term PT goals Target date:08/14/2023   Pt will improve neck AROM >10 deg each plane to improve functional mobility.  Baseline: Goal status: New  Pt will improve FOTO to at least 62% functional to show improved function Baseline: Goal status: New Pt will reduce pain to overall less than 4/10 with usual activity and turning his hea Baseline:7 Goal status: New  PLAN: PT FREQUENCY: 1-2 times per week   PT DURATION: 8 weeks  PLANNED INTERVENTIONS (unless contraindicated): aquatic PT, Canalith repositioning, cryotherapy, Electrical stimulation, Iontophoresis with 4 mg/ml dexamethasome, Moist heat, traction, Ultrasound, gait training, Therapeutic exercise, balance training, neuromuscular re-education, patient/family education, prosthetic training, manual techniques, passive ROM, dry needling, taping, vasopnuematic device, vestibular, spinal manipulations, joint manipulations  PLAN FOR NEXT SESSION: l Continue with DN combined with Estim, strengthening and ROM.   April Manson, PT,DPT 06/28/2023,  11:40 AM

## 2023-07-05 ENCOUNTER — Encounter: Payer: Self-pay | Admitting: Physical Therapy

## 2023-07-05 ENCOUNTER — Ambulatory Visit: Payer: Medicare Other | Admitting: Physical Therapy

## 2023-07-05 DIAGNOSIS — M542 Cervicalgia: Secondary | ICD-10-CM

## 2023-07-05 NOTE — Therapy (Signed)
OUTPATIENT PHYSICAL THERAPY TREATMENT  Date of referral: 05/09/23 Referring provider: Juanda Chance, NP  Referring diagnosis?  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 (if different than referring diagnosis)  M54.2   What was this (referring dx) caused by? Surgery (Type: Cervical Fusion), Ongoing Issue, and Arthritis  Nature of Condition: Chronic (continuous duration > 3 months)   Laterality: Rt  Current Functional Measure Score: FOTO 49% functional, goal is 62%  Objective measurements identify impairments when they are compared to normal values, the uninvolved extremity, and prior level of function.  [x]  Yes  []  No  Objective assessment of functional ability: Moderate functional limitations   Briefly describe symptoms: Pain and tightness in Right side of his neck, post laminectomy syndrome and history of cervical fusion C3-T1  How did symptoms start: Post surgical  Average pain intensity:  Last 24 hours: 5/10  Past week: 5-7/10  How often does the pt experience symptoms? Constantly  How much have the symptoms interfered with usual daily activities? Quite a bit  How has condition changed since care began at this facility? A little better  In general, how is the patients overall health? Good       Patient Name: Joshua [name] MRN: [id number] DOB:[birth year], 77 y.o., male Today's Date: 07/05/2023  END OF SESSION:  PT End of Session - 07/05/23 1259     Visit Number 8    Number of Visits 15    Date for PT Re-Evaluation 08/14/23    Authorization Type UHC MCR    Authorization Time Period 8 visits 9/24 to 10/22    Authorization - Visit Number 3    Authorization - Number of Visits 8    Progress Note Due on Visit 16    PT Start Time 1100    PT Stop Time 1145    PT Time Calculation (min) 45 min    Activity Tolerance Patient tolerated treatment well     Behavior During Therapy WFL for tasks assessed/performed              Past Medical History:  Diagnosis Date   Chronic anxiety    Chronic rhinitis    Claustrophobia    Diverticulosis of colon (without mention of hemorrhage)    DJD (degenerative joint disease)    OA AND PAIN RIGHT KNEE. DDD including fusions lumbar spine   GERD (gastroesophageal reflux disease)    ONLY WITH CERTAIN FOODS   History of total right knee replacement    Hx of vertigo    Hyperlipidemia    Hypertension    PTSD (post-traumatic stress disorder)    [place] VET[employer]   TOS (thoracic outlet syndrome)    Saw a physical therapist--PAIN RIGHT SHOULDER- MUCH IMPROVED   Past Surgical History:  Procedure Laterality Date   ANTERIOR CERVICAL DECOMP/DISCECTOMY FUSION N/A 11/30/2015   Procedure: CERVICAL SEVEN-THORACIC ONE ANTERIOR CERVICAL DISCECTOMY/FUSION;  Surgeon: Julio Sicks, MD;  Location: [facility];  Service: Neurosurgery;  Laterality: N/A;   CATARACT EXTRACTION     [facility] in [facility]24[facility]   CERVICAL DISC SURGERY  2009   c3-4 5-6/ by Dr Dutch Quint- FUSION   COLONOSCOPY     TONSILLECTOMY     AS A CHILD   TOTAL KNEE ARTHROPLASTY Right 07/21/2014   Procedure: TOTAL RIGHT KNEE ARTHROPLASTY;  Surgeon: Shelda Pal, MD;  Location: [facility];  Service: Orthopedics;  Laterality: Right;  TOTAL KNEE ARTHROPLASTY Left 08/25/2019   Procedure: TOTAL KNEE ARTHROPLASTY;  Surgeon: Ollen Gross, MD;  Location: [facility];  Service: Orthopedics;  Laterality: Left;    Patient Active Problem List   Diagnosis Date Noted   Aortic atherosclerosis (HCC) 12/07/2020   S/P total knee arthroplasty, left 08/25/2019   S/P total knee arthroplasty 08/25/2019   Former smoker 03/26/2017   GERD (gastroesophageal reflux disease) 03/26/2017   Allergic rhinitis 03/26/2017   Cervical spondylosis with radiculopathy 11/30/2015   Insomnia 07/18/2015   TOS (thoracic outlet syndrome)    Cough 10/27/2008   Hyperlipidemia,  unspecified 09/16/2008   Chronic rhinitis 09/16/2008   OA (osteoarthritis) of knee 09/16/2008   VERTIGO 09/16/2008    PCP: Shelva Majestic, MD   REFERRING PROVIDER: Juanda Chance, NP  REFERRING DIAG: M54.2 (ICD-10-CM) - Cervicalgia M79.18 (ICD-10-CM) - Myofascial pain syndrome 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.1 (ICD-10-CM) - Post laminectomy syndrome  THERAPY DIAG:  Cervicalgia  Rationale for Evaluation and Treatment: Rehabilitation  ONSET DATE: chronic neck pain                                          SUBJECTIVE:                                                                                                                                                                 SUBJECTIVE STATEMENT: He relays he is feeling overall better, but still with some tenderness and pain at times in his neck  PERTINENT HISTORY: BJY:[id number] fusion C3-T1, anx, claustrophobia,DJD, vertigo, PTSD,   PAIN:  NPRS scale: 3-4/10 upon arrival Pain location:Rt side of his neck Pain description: constant, can light him up at times Aggravating factors: moving his neck, driving Relieving factors: rest, meds   PRECAUTIONS: None  RED FLAGS: None     WEIGHT BEARING RESTRICTIONS: No  FALLS:  Has patient fallen in last 6 months? No   PLOF: Independent  PATIENT GOALS: reduce, wants to try DN.   NEXT MD VISIT: will see surgeon 05/23/27, referring provider 06/28/23  OBJECTIVE:   DIAGNOSTIC FINDINGS:  "AP and lateral radiographs of cervical spine shows maintained cervical  lordosis intact ACDF hardware at C3-C4, C4-C5, C5-C6 and C7-T1. Facet  arthropathy noted bilaterally above the fusion at C2-C3. No fractures or  dislocations. "  PATIENT SURVEYS:  Eval: FOTO 49% functional, goal is 62%  COGNITION: Overall cognitive status: Within functional limits for tasks assessed  PALPATION: Tender to palpation with pain rt sided cervical paraspinals and upper trap   CERVICAL ROM:   Active ROM A/PROM (deg) eval AROM   Flexion 30   Extension 20   Right lateral flexion 7 10  Left lateral flexion 25   Right rotation 35 45  Left rotation 40 50   (Blank rows = not tested)  UPPER EXTREMITY ROM:   AROM Right eval Left eval  Shoulder flexion [facility]  Shoulder extension    Shoulder abduction    Shoulder adduction    Shoulder extension    Shoulder internal rotation    Shoulder external rotation    Elbow flexion    Elbow extension    Wrist flexion    Wrist extension    Wrist ulnar deviation    Wrist radial deviation    Wrist pronation    Wrist supination     (Blank rows = not tested)  UPPER EXTREMITY MMT:  MMT Right eval Left eval  Shoulder flexion 5 5  Shoulder extension    Shoulder abduction 5 5  Shoulder adduction    Shoulder extension    Shoulder internal rotation 5 5  Shoulder external rotation 4+ 4+  Middle trapezius    Lower trapezius    Elbow flexion 5 5  Elbow extension 5 5  Wrist flexion    Wrist extension    Wrist ulnar deviation    Wrist radial deviation    Wrist pronation    Wrist supination    Grip strength WFL WFL   (Blank rows = not tested)   TODAY'S TREATMENT:  07/05/23 Manual therapy for active compression and skilled palpation and Trigger Point Dry-Needling  Treatment instructions: Expect mild to moderate muscle soreness. Patient Consent Given: Yes Education handout provided: verbally provided Muscles treated: Rt sided for Cervical paraspinals and multifidi, upper traps, levator, suboccipitals combined with Estim today milli amp current with 2 frequency with intensity turned up to tolerance. Treatment response/outcome: good overall tolerance,twitch response noted   Therex: Cervical rotation stretch with towel 5 sec X 10  06/28/23 Manual therapy for active compression and skilled palpation and Trigger Point Dry-Needling  Treatment instructions: Expect mild to moderate muscle soreness. Patient Consent Given: Yes Education handout provided: verbally  provided Muscles treated: Rt sided for Cervical paraspinals and multifidi, upper traps, levator, suboccipitals combined with Estim today milli amp current with 2 frequency with intensity turned up to tolerance. Treatment response/outcome: good overall tolerance,twitch response noted   Therex: Cervical rotation stretch with towel 5 sec X 10 Row machine 55# X 20 Lat pulldown machine 45# X 20 Chest press machine 45# X 20 Shoulder flexion 5# 2X10 bilat Shoulder abduction 5# 2X10 bilat  06/19/23 Manual therapy for active compression and skilled palpation and Trigger Point Dry-Needling  Treatment instructions: Expect mild to moderate muscle soreness. Patient Consent Given: Yes Education handout provided: verbally provided Muscles treated: Rt sided for Cervical paraspinals and multifidi, upper traps, levator, suboccipitals combined with Estim today milli amp current with 2 frequency with intensity turned up to tolerance. Treatment response/outcome: good overall tolerance,twitch response noted   Therex: Cervical rotation stretch with towel 5 sec X 10 Row machine 35# X 20 Lat pulldown machine 35# X 20 Chest press machine 35# X 20     PATIENT EDUCATION: Education details: HEP, PT plan of care Person educated: Patient Education method: Explanation, Demonstration, Verbal cues, and Handouts Education comprehension: verbalized understanding and needs further education   HOME EXERCISE PROGRAM: Access Code: [email or web address] URL: [email or web address] Date: 05/17/2023 Prepared by: Ivery Quale  Exercises - Seated Assisted Cervical Rotation with Towel  - 2 x daily - 6 x weekly - 1-2 sets - 10 reps - 5 hold - Seated Levator Scapulae Stretch  -  2 x daily - 6 x weekly - 1 sets - 3 reps - 15 hold - Seated Cervical Sidebending Stretch  - 2 x daily - 6 x weekly - 1 sets - 3 reps - 15 hold - Seated Cervical Retraction  - 2 x daily - 6 x weekly - 1 sets - 10 reps - Standing Shoulder Row  with Anchored Resistance  - 2 x daily - 6 x weekly - 2-3 sets - 10 reps - Shoulder External Rotation and Scapular Retraction with Resistance  - 2 x daily - 6 x weekly - 3 sets - 10 reps  ASSESSMENT:  CLINICAL IMPRESSION: Spent longer with manual therapy and DN combined with Estim today as he will miss next 2 weeks due to being on vacation, then PT is on vacation.  He will keep up with HEP in the meantime and will continue to benefit from skilled PT to reduce pain, improve ROM, and improve functional abilities.   Per eval Patient referred to PT for cervicalgia and myofascial pain syndrome . He is post op cervical fusion C3-T1". He has cervical hypomobility as well as Right neck muscular pain and tightness with trigger points. We did try DN today which did appear to provide some relief and improved ROM after. Patient will benefit from skilled PT to address below impairments, limitations and improve overall function.  OBJECTIVE IMPAIRMENTS: decreased activity tolerance, decreased neck mobility, decreased ROM, decreased strength, impaired flexibility,  postural dysfunction, and pain.  ACTIVITY LIMITATIONS: tuning his head, lifting, driving PERSONAL FACTORS: ZOX:WRUEAVWU fusion C3-T1, anx, claustrophobia,DJD, vertigo, PTSD, also affecting patient's functional outcome.  REHAB POTENTIAL: Good  CLINICAL DECISION MAKING: Stable/uncomplicated  EVALUATION COMPLEXITY: Low    GOALS: Short term PT Goals Target date: 06/14/2023   Pt will be I and compliant with HEP. Baseline:  Goal status: MET 06/05/23 Pt will decrease pain by 25% overall Baseline:7 Goal status: MET 06/05/23  Long term PT goals Target date:08/14/2023   Pt will improve neck AROM >10 deg each plane to improve functional mobility.  Baseline: Goal status: New  Pt will improve FOTO to at least 62% functional to show improved function Baseline: Goal status: New Pt will reduce pain to overall less than 4/10 with usual activity and  turning his hea Baseline:7 Goal status: New  PLAN: PT FREQUENCY: 1-2 times per week   PT DURATION: 8 weeks  PLANNED INTERVENTIONS (unless contraindicated): aquatic PT, Canalith repositioning, cryotherapy, Electrical stimulation, Iontophoresis with 4 mg/ml dexamethasome, Moist heat, traction, Ultrasound, gait training, Therapeutic exercise, balance training, neuromuscular re-education, patient/family education, prosthetic training, manual techniques, passive ROM, dry needling, taping, vasopnuematic device, vestibular, spinal manipulations, joint manipulations  PLAN FOR NEXT SESSION: l Continue with DN combined with Estim, strengthening and ROM.   April Manson, PT,DPT 07/05/2023, 1:00 PM

## 2023-07-10 DIAGNOSIS — M47812 Spondylosis without myelopathy or radiculopathy, cervical region: Secondary | ICD-10-CM | POA: Diagnosis not present

## 2023-07-12 ENCOUNTER — Encounter: Payer: Medicare Other | Admitting: Physical Therapy

## 2023-07-26 ENCOUNTER — Encounter: Payer: Medicare Other | Admitting: Physical Therapy

## 2023-08-02 ENCOUNTER — Encounter: Payer: Self-pay | Admitting: Physical Therapy

## 2023-08-02 ENCOUNTER — Ambulatory Visit: Payer: Medicare Other | Admitting: Physical Therapy

## 2023-08-02 DIAGNOSIS — M542 Cervicalgia: Secondary | ICD-10-CM | POA: Diagnosis not present

## 2023-08-02 NOTE — Therapy (Signed)
OUTPATIENT PHYSICAL THERAPY TREATMENT  Date of referral: 05/09/23 Referring provider: Juanda Chance, NP  Referring diagnosis?  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 (if different than referring diagnosis)  M54.2   What was this (referring dx) caused by? Surgery (Type: Cervical Fusion), Ongoing Issue, and Arthritis  Nature of Condition: Chronic (continuous duration > 3 months)   Laterality: Rt  Current Functional Measure Score:  At eval FOTO 49% functional, goal is 62% 08/02/23 FOTO improved to 53%  Objective measurements identify impairments when they are compared to normal values, the uninvolved extremity, and prior level of function.  [x]  Yes  []  No  Objective assessment of functional ability: Moderate functional limitations   Briefly describe symptoms: Pain and tightness in Right side of his neck, post laminectomy syndrome and history of cervical fusion C3-T1  How did symptoms start: Post surgical  Average pain intensity:  Last 24 hours: 5/10  Past week: 5-7/10  How often does the pt experience symptoms? Constantly  How much have the symptoms interfered with usual daily activities? Quite a bit  How has condition changed since care began at this facility? A little better  In general, how is the patients overall health? Good       Patient Name: Joshua [name] MRN: [id number] DOB:Jan 22, 1946, 77 y.o., male Today's Date: 08/02/2023  END OF SESSION:  PT End of Session - 08/02/23 1318     Visit Number 9    Number of Visits 15    Date for PT Re-Evaluation 08/14/23    Authorization Type UHC MCR    Authorization Time Period 8 visits 9/24 to 10/22    Authorization - Visit Number 4    Authorization - Number of Visits 8    Progress Note Due on Visit 16    PT Start Time 1100    PT Stop Time 1145    PT Time Calculation (min) 45 min    Activity  Tolerance Patient tolerated treatment well    Behavior During Therapy WFL for tasks assessed/performed              Past Medical History:  Diagnosis Date   Chronic anxiety    Chronic rhinitis    Claustrophobia    Diverticulosis of colon (without mention of hemorrhage)    DJD (degenerative joint disease)    OA AND PAIN RIGHT KNEE. DDD including fusions lumbar spine   GERD (gastroesophageal reflux disease)    ONLY WITH CERTAIN FOODS   History of total right knee replacement    Hx of vertigo    Hyperlipidemia    Hypertension    PTSD (post-traumatic stress disorder)    [place] VET[employer]   TOS (thoracic outlet syndrome)    Saw a physical therapist--PAIN RIGHT SHOULDER- MUCH IMPROVED   Past Surgical History:  Procedure Laterality Date   ANTERIOR CERVICAL DECOMP/DISCECTOMY FUSION N/A 11/30/2015   Procedure: CERVICAL SEVEN-THORACIC ONE ANTERIOR CERVICAL DISCECTOMY/FUSION;  Surgeon: Julio Sicks, MD;  Location: [facility];  Service: Neurosurgery;  Laterality: N/A;   CATARACT EXTRACTION     [facility] in [facility]24[facility]   CERVICAL DISC SURGERY  2009   c3-4 5-6/ by Dr Dutch Quint- FUSION   COLONOSCOPY     TONSILLECTOMY     AS A CHILD   TOTAL KNEE ARTHROPLASTY Right 07/21/2014   Procedure: TOTAL RIGHT KNEE ARTHROPLASTY;  Surgeon: Shelda Pal, MD;  Location: [facility];  Service: Orthopedics;  Laterality: Right;   TOTAL KNEE ARTHROPLASTY Left 08/25/2019   Procedure: TOTAL KNEE ARTHROPLASTY;  Surgeon: Ollen Gross, MD;  Location: [facility];  Service: Orthopedics;  Laterality: Left;    Patient Active Problem List   Diagnosis Date Noted   Aortic atherosclerosis (HCC) 12/07/2020   S/P total knee arthroplasty, left 08/25/2019   S/P total knee arthroplasty 08/25/2019   Former smoker 03/26/2017   GERD (gastroesophageal reflux disease) 03/26/2017   Allergic rhinitis 03/26/2017   Cervical spondylosis with radiculopathy 11/30/2015   Insomnia 07/18/2015   TOS (thoracic outlet  syndrome)    Cough 10/27/2008   Hyperlipidemia, unspecified 09/16/2008   Chronic rhinitis 09/16/2008   OA (osteoarthritis) of knee 09/16/2008   VERTIGO 09/16/2008    PCP: Shelva Majestic, MD   REFERRING PROVIDER: Juanda Chance, NP  REFERRING DIAG: M54.2 (ICD-10-CM) - Cervicalgia M79.18 (ICD-10-CM) - Myofascial pain syndrome M96.1 (ICD-10-CM) - Post laminectomy syndrome  THERAPY DIAG:  Cervicalgia  Rationale for Evaluation and Treatment: Rehabilitation  ONSET DATE: chronic neck pain                                          SUBJECTIVE:                                                                                                                                                                 SUBJECTIVE STATEMENT: He has been having more pain and what he describes as soreness in his neck. He says neurosurgeon office contacted him and is recommending to have RFA procedure.  PERTINENT HISTORY: [id number]:[id number] fusion C3-T1, anx, claustrophobia,DJD, vertigo, PTSD,   PAIN:  NPRS scale: 5/10 upon arrival Pain location:Rt side of his neck Pain description: constant, can light him up at times Aggravating factors: moving his neck, driving Relieving factors: rest, meds   PRECAUTIONS: None  RED FLAGS: None     WEIGHT BEARING RESTRICTIONS: No  FALLS:  Has patient fallen in last 6 months? No   PLOF: Independent  PATIENT GOALS: reduce, wants to try DN.   NEXT MD VISIT: will see surgeon 05/23/27, referring provider 06/28/23  OBJECTIVE:   DIAGNOSTIC FINDINGS:  "AP and lateral radiographs of cervical spine shows maintained cervical  lordosis intact ACDF hardware at C3-C4, C4-C5, C5-C6 and C7-T1. Facet  arthropathy noted bilaterally above the fusion at C2-C3. No fractures or  dislocations. "  PATIENT SURVEYS:  Eval: FOTO 49% functional, goal is 62% 08/02/23 FOTO improved to 53%  COGNITION: Overall cognitive status: Within functional limits for tasks  assessed  PALPATION: Tender to palpation with pain rt sided cervical paraspinals and upper trap   CERVICAL ROM:  Active ROM A/PROM (deg) eval AROM 08/02/23  Flexion 30   Extension 20   Right lateral flexion 7 10  Left lateral flexion 25   Right rotation 35 45  Left rotation 40 50   (Blank rows = not tested)  UPPER EXTREMITY ROM:   AROM Right eval Left eval  Shoulder flexion [facility]  Shoulder extension    Shoulder abduction    Shoulder adduction    Shoulder extension    Shoulder internal rotation    Shoulder external rotation    Elbow flexion    Elbow extension    Wrist flexion    Wrist extension    Wrist ulnar deviation    Wrist radial deviation    Wrist pronation    Wrist supination     (Blank rows = not tested)  UPPER EXTREMITY MMT:  MMT Right eval Left eval  Shoulder flexion 5 5  Shoulder extension    Shoulder abduction 5 5  Shoulder adduction    Shoulder extension    Shoulder internal rotation 5 5  Shoulder external rotation 4+ 4+  Middle trapezius    Lower trapezius    Elbow flexion 5 5  Elbow extension 5 5  Wrist flexion    Wrist extension    Wrist ulnar deviation    Wrist radial deviation    Wrist pronation    Wrist supination    Grip strength WFL WFL   (Blank rows = not tested)   TODAY'S TREATMENT:  08/02/23 Manual therapy for active compression and skilled palpation and Trigger Point Dry-Needling  Treatment instructions: Expect mild to moderate muscle soreness. Patient Consent Given: Yes Education handout provided: verbally provided Muscles treated: Rt sided for Cervical paraspinals and multifidi, upper traps, levator, suboccipitals combined with Estim today milli amp current with 2 frequency with intensity turned up to tolerance. Treatment response/outcome: good overall tolerance,twitch response noted   Therex: Cervical rotation stretch with towel 5 sec X 10    PATIENT EDUCATION: Education details: HEP, PT plan of care Person  educated: Patient Education method: Explanation, Demonstration, Verbal cues, and Handouts Education comprehension: verbalized understanding and needs further education   HOME EXERCISE PROGRAM: Access Code: [email or web address] URL: [email or web address] Date: 05/17/2023 Prepared by: Ivery Quale  Exercises - Seated Assisted Cervical Rotation with Towel  - 2 x daily - 6 x weekly - 1-2 sets - 10 reps - 5 hold - Seated Levator Scapulae Stretch  - 2 x daily - 6 x weekly - 1 sets - 3 reps - 15 hold - Seated Cervical Sidebending Stretch  - 2 x daily - 6 x weekly - 1 sets - 3 reps - 15 hold - Seated Cervical Retraction  - 2 x daily - 6 x weekly - 1 sets - 10 reps - Standing Shoulder Row with Anchored Resistance  - 2 x daily - 6 x weekly - 2-3 sets - 10 reps - Shoulder External Rotation and Scapular Retraction with Resistance  - 2 x daily - 6 x weekly - 3 sets - 10 reps  ASSESSMENT:  CLINICAL IMPRESSION:  He has had some time off since PT and is having more pain and soreness reported. This was relieved some with the DN combined with Estim. He has made some progress, his FOTO score improved with PT but he has not yet met his PT goals and  will continue to benefit from skilled PT to reduce pain, improve ROM, and improve functional abilities.   Per eval Patient referred to PT  for cervicalgia and myofascial pain syndrome . He is post op cervical fusion C3-T1". He has cervical hypomobility as well as Right neck muscular pain and tightness with trigger points. We did try DN today which did appear to provide some relief and improved ROM after. Patient will benefit from skilled PT to address below impairments, limitations and improve overall function.  OBJECTIVE IMPAIRMENTS: decreased activity tolerance, decreased neck mobility, decreased ROM, decreased strength, impaired flexibility,  postural dysfunction, and pain.  ACTIVITY LIMITATIONS: tuning his head, lifting, driving PERSONAL FACTORS:  ZOX:WRUEAVWU fusion C3-T1, anx, claustrophobia,DJD, vertigo, PTSD, also affecting patient's functional outcome.  REHAB POTENTIAL: Good  CLINICAL DECISION MAKING: Stable/uncomplicated  EVALUATION COMPLEXITY: Low    GOALS: Short term PT Goals Target date: 06/14/2023   Pt will be I and compliant with HEP. Baseline:  Goal status: MET 06/05/23 Pt will decrease pain by 25% overall Baseline:7 Goal status: MET 06/05/23  Long term PT goals Target date:08/14/2023   Pt will improve neck AROM >10 deg each plane to improve functional mobility.  Baseline: Goal status: ongoing, still limited with lateral flexion but rotation has improved 08/02/23  Pt will improve FOTO to at least 62% functional to show improved function Baseline: Goal status: ongoing, improved to 53% on 08/02/23 Pt will reduce pain to overall less than 4/10 with usual activity and turning his hea Baseline:7 Goal status: ongoing has been less than this in weeks past but was 5/10 today on  08/02/23 PLAN: PT FREQUENCY: 1-2 times per week   PT DURATION: 8 weeks  PLANNED INTERVENTIONS (unless contraindicated): aquatic PT, Canalith repositioning, cryotherapy, Electrical stimulation, Iontophoresis with 4 mg/ml dexamethasome, Moist heat, traction, Ultrasound, gait training, Therapeutic exercise, balance training, neuromuscular re-education, patient/family education, prosthetic training, manual techniques, passive ROM, dry needling, taping, vasopnuematic device, vestibular, spinal manipulations, joint manipulations  PLAN FOR NEXT SESSION: l Continue with DN combined with Estim, strengthening and ROM.   April Manson, PT,DPT 08/02/2023, 1:22 PM

## 2023-08-15 DIAGNOSIS — M47812 Spondylosis without myelopathy or radiculopathy, cervical region: Secondary | ICD-10-CM | POA: Diagnosis not present

## 2023-08-16 ENCOUNTER — Ambulatory Visit: Payer: Medicare Other | Admitting: Physical Therapy

## 2023-08-16 ENCOUNTER — Encounter: Payer: Self-pay | Admitting: Physical Therapy

## 2023-08-16 DIAGNOSIS — M542 Cervicalgia: Secondary | ICD-10-CM

## 2023-08-16 NOTE — Therapy (Signed)
OUTPATIENT PHYSICAL THERAPY TREATMENT/Progress note Progress Note reporting period 05/17/23 to 08/16/23  See below for objective and subjective measurements relating to patients progress with PT.   Date of referral: 05/09/23 Referring provider: Juanda Chance, NP  Referring diagnosis?  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 (if different than referring diagnosis)  M54.2   What was this (referring dx) caused by? Surgery (Type: Cervical Fusion), Ongoing Issue, and Arthritis  Nature of Condition: Chronic (continuous duration > 3 months)   Laterality: Rt  Current Functional Measure Score:  At eval FOTO 49% functional, goal is 62% 08/02/23 FOTO improved to 53%  Objective measurements identify impairments when they are compared to normal values, the uninvolved extremity, and prior level of function.  [x]  Yes  []  No  Objective assessment of functional ability: Moderate functional limitations   Briefly describe symptoms: Pain and tightness in Right side of his neck, post laminectomy syndrome and history of cervical fusion C3-T1  How did symptoms start: Post surgical  Average pain intensity:  Last 24 hours: 5/10  Past week: 5-7/10  How often does the pt experience symptoms? Constantly  How much have the symptoms interfered with usual daily activities? Quite a bit  How has condition changed since care began at this facility? A little better  In general, how is the patients overall health? Good       Patient Name: Joshua [name]. MRN: [id number] DOB:10/21/1945, 77 y.o., male Today's Date: 08/16/2023  END OF SESSION:  PT End of Session - 08/16/23 1007     Visit Number 10    Number of Visits 15    Date for PT Re-Evaluation 09/27/23    Authorization Type UHC MCR    Authorization Time Period 8 visits 9/24 to 10/22    Authorization - Visit Number 5     Authorization - Number of Visits 8    Progress Note Due on Visit 16    PT Start Time 0930    PT Stop Time 1015    PT Time Calculation (min) 45 min    Activity Tolerance Patient tolerated treatment well    Behavior During Therapy WFL for tasks assessed/performed              Past Medical History:  Diagnosis Date   Chronic anxiety    Chronic rhinitis    Claustrophobia    Diverticulosis of colon (without mention of hemorrhage)    DJD (degenerative joint disease)    OA AND PAIN RIGHT KNEE. DDD including fusions lumbar spine   GERD (gastroesophageal reflux disease)    ONLY WITH CERTAIN FOODS   History of total right knee replacement    Hx of vertigo    Hyperlipidemia    Hypertension    PTSD (post-traumatic stress disorder)    [place] VET[employer]   TOS (thoracic outlet syndrome)    Saw a physical therapist--PAIN RIGHT SHOULDER- MUCH IMPROVED   Past Surgical History:  Procedure Laterality Date   ANTERIOR CERVICAL DECOMP/DISCECTOMY FUSION N/A 11/30/2015   Procedure: CERVICAL SEVEN-THORACIC ONE ANTERIOR CERVICAL DISCECTOMY/FUSION;  Surgeon: Julio Sicks, MD;  Location: [facility];  Service: Neurosurgery;  Laterality: N/A;   CATARACT EXTRACTION     [facility] in [facility]24[facility]   CERVICAL DISC SURGERY  2009   c3-4 5-6/ by Dr Dutch Quint- FUSION   COLONOSCOPY     TONSILLECTOMY     AS A  CHILD   TOTAL KNEE ARTHROPLASTY Right 07/21/2014   Procedure: TOTAL RIGHT KNEE ARTHROPLASTY;  Surgeon: Shelda Pal, MD;  Location: [facility];  Service: Orthopedics;  Laterality: Right;   TOTAL KNEE ARTHROPLASTY Left 08/25/2019   Procedure: TOTAL KNEE ARTHROPLASTY;  Surgeon: Ollen Gross, MD;  Location: [facility];  Service: Orthopedics;  Laterality: Left;    Patient Active Problem List   Diagnosis Date Noted   Aortic atherosclerosis (HCC) 12/07/2020   S/P total knee arthroplasty, left 08/25/2019   S/P total knee arthroplasty 08/25/2019   Former smoker 03/26/2017   GERD  (gastroesophageal reflux disease) 03/26/2017   Allergic rhinitis 03/26/2017   Cervical spondylosis with radiculopathy 11/30/2015   Insomnia 07/18/2015   TOS (thoracic outlet syndrome)    Cough 10/27/2008   Hyperlipidemia, unspecified 09/16/2008   Chronic rhinitis 09/16/2008   OA (osteoarthritis) of knee 09/16/2008   VERTIGO 09/16/2008    PCP: Shelva Majestic, MD   REFERRING PROVIDER: Juanda Chance, NP  REFERRING DIAG: M54.2 (ICD-10-CM) - Cervicalgia M79.18 (ICD-10-CM) - Myofascial pain syndrome M96.1 (ICD-10-CM) - Post laminectomy syndrome  THERAPY DIAG:  Cervicalgia  Rationale for Evaluation and Treatment: Rehabilitation  ONSET DATE: chronic neck pain                                          SUBJECTIVE:                                                                                                                                                                 SUBJECTIVE STATEMENT: He has been doing pretty good in terms of pain, but stays sore. He followed up with surgeon who recommends he not have RFA procedure at this time and recommends to hold PT for now to see how he does without it since he has improved.  PERTINENT HISTORY: [id number]:[id number] fusion C3-T1, anx, claustrophobia,DJD, vertigo, PTSD,   PAIN:  NPRS scale: 2/10 upon arrival Pain location:Rt side of his neck Pain description: constant, can light him up at times Aggravating factors: moving his neck, driving Relieving factors: rest, meds   PRECAUTIONS: None  RED FLAGS: None     WEIGHT BEARING RESTRICTIONS: No  FALLS:  Has patient fallen in last 6 months? No   PLOF: Independent  PATIENT GOALS: reduce, wants to try DN.   NEXT MD VISIT: will see surgeon 05/23/27, referring provider 06/28/23  OBJECTIVE:   DIAGNOSTIC FINDINGS:  "AP and lateral radiographs of cervical spine shows maintained cervical  lordosis intact ACDF hardware at C3-C4, C4-C5, C5-C6 and C7-T1. Facet  arthropathy noted  bilaterally above the fusion at C2-C3. No fractures or  dislocations. "  PATIENT SURVEYS:  Eval: FOTO 49% functional, goal is 62% 08/02/23 FOTO improved to 53%  COGNITION: Overall cognitive status: Within functional limits for tasks assessed  PALPATION: Tender to palpation with pain rt sided cervical paraspinals and upper trap   CERVICAL ROM:   Active ROM A/PROM (deg) eval AROM 08/02/23 AROM 08/16/23  Flexion 30    Extension 20    Right lateral flexion 7 10 12   Left lateral flexion 25  25  Right rotation 35 45 50  Left rotation 40 50 50   (Blank rows = not tested)  UPPER EXTREMITY ROM:   AROM Right eval Left eval  Shoulder flexion [facility]  Shoulder extension    Shoulder abduction    Shoulder adduction    Shoulder extension    Shoulder internal rotation    Shoulder external rotation    Elbow flexion    Elbow extension    Wrist flexion    Wrist extension    Wrist ulnar deviation    Wrist radial deviation    Wrist pronation    Wrist supination     (Blank rows = not tested)  UPPER EXTREMITY MMT:  MMT Right eval Left eval Rt/Lt 08/16/23  Shoulder flexion 5 5   Shoulder extension     Shoulder abduction 5 5   Shoulder adduction     Shoulder extension     Shoulder internal rotation 5 5   Shoulder external rotation 4+ 4+ 4+/4+  Middle trapezius     Lower trapezius     Elbow flexion 5 5   Elbow extension 5 5   Wrist flexion     Wrist extension     Wrist ulnar deviation     Wrist radial deviation     Wrist pronation     Wrist supination     Grip strength WFL WFL    (Blank rows = not tested)   TODAY'S TREATMENT:  08/16/23 Manual therapy for active compression and skilled palpation and Trigger Point Dry-Needling  Treatment instructions: Expect mild to moderate muscle soreness. Patient Consent Given: Yes Education handout provided: verbally provided Muscles treated: Rt sided for Cervical paraspinals and multifidi, upper traps, levator, suboccipitals  combined with Estim today milli amp current with 2 frequency with intensity turned up to tolerance. Treatment response/outcome: good overall tolerance,twitch response noted   Therex: Cervical rotation stretch with towel 5 sec X 10 Row machine 45# 2X15 Lat pulldown machine 35# 2X15 Updated measurements and goals.    PATIENT EDUCATION: Education details: HEP, PT plan of care Person educated: Patient Education method: Explanation, Demonstration, Verbal cues, and Handouts Education comprehension: verbalized understanding and needs further education   HOME EXERCISE PROGRAM: Access Code: [email or web address] URL: [email or web address] Date: 05/17/2023 Prepared by: Ivery Quale  Exercises - Seated Assisted Cervical Rotation with Towel  - 2 x daily - 6 x weekly - 1-2 sets - 10 reps - 5 hold - Seated Levator Scapulae Stretch  - 2 x daily - 6 x weekly - 1 sets - 3 reps - 15 hold - Seated Cervical Sidebending Stretch  - 2 x daily - 6 x weekly - 1 sets - 3 reps - 15 hold - Seated Cervical Retraction  - 2 x daily - 6 x weekly - 1 sets - 10 reps - Standing Shoulder Row with Anchored Resistance  - 2 x daily - 6 x weekly - 2-3 sets - 10 reps - Shoulder External Rotation and Scapular Retraction with Resistance  - 2 x daily - 6 x weekly -  3 sets - 10 reps  ASSESSMENT:  CLINICAL IMPRESSION:  10th visit progress note reflects he has made good progress with PT in terms of pain reduction, and increased cervical ROM/mobility. At this point we will hold off on PT to trial independent program for up to 30 days. If he does not need to come back we will discharge after that.  Per eval Patient referred to PT for cervicalgia and myofascial pain syndrome . He is post op cervical fusion C3-T1". He has cervical hypomobility as well as Right neck muscular pain and tightness with trigger points. We did try DN today which did appear to provide some relief and improved ROM after. Patient will benefit from skilled  PT to address below impairments, limitations and improve overall function.  OBJECTIVE IMPAIRMENTS: decreased activity tolerance, decreased neck mobility, decreased ROM, decreased strength, impaired flexibility,  postural dysfunction, and pain.  ACTIVITY LIMITATIONS: tuning his head, lifting, driving PERSONAL FACTORS: IRJ:[id number] fusion C3-T1, anx, claustrophobia,DJD, vertigo, PTSD, also affecting patient's functional outcome.  REHAB POTENTIAL: Good  CLINICAL DECISION MAKING: Stable/uncomplicated  EVALUATION COMPLEXITY: Low    GOALS: Short term PT Goals Target date: 06/14/2023   Pt will be I and compliant with HEP. Baseline:  Goal status: MET 06/05/23 Pt will decrease pain by 25% overall Baseline:7 Goal status: MET 06/05/23  Long term PT goals Target date:08/14/2023   Pt will improve neck AROM >10 deg each plane to improve functional mobility.  Baseline: Goal status: ongoing, still limited with lateral flexion but rotation has improved  5 deg on 08/16/23  Pt will improve FOTO to at least 62% functional to show improved function Baseline: Goal status: ongoing, improved to 53% on 08/02/23 Pt will reduce pain to overall less than 4/10 with usual activity and turning his hea Baseline:7 Goal status: MET 08/16/23 PLAN: PT FREQUENCY: 1-2 times per week   PT DURATION: 8 weeks  PLANNED INTERVENTIONS (unless contraindicated): aquatic PT, Canalith repositioning, cryotherapy, Electrical stimulation, Iontophoresis with 4 mg/ml dexamethasome, Moist heat, traction, Ultrasound, gait training, Therapeutic exercise, balance training, neuromuscular re-education, patient/family education, prosthetic training, manual techniques, passive ROM, dry needling, taping, vasopnuematic device, vestibular, spinal manipulations, joint manipulations  PLAN FOR NEXT SESSION: hold PT for up to 30 days, DC after that if no return or do recert if he needs to return.  April Manson, PT,DPT 08/16/2023, 10:09  AM

## 2023-08-21 ENCOUNTER — Encounter: Payer: Medicare Other | Admitting: Physical Therapy

## 2023-08-28 ENCOUNTER — Encounter: Payer: Medicare Other | Admitting: Physical Therapy

## 2023-10-24 NOTE — Telephone Encounter (Signed)
Noted

## 2023-12-03 DIAGNOSIS — L821 Other seborrheic keratosis: Secondary | ICD-10-CM | POA: Diagnosis not present

## 2023-12-03 DIAGNOSIS — L820 Inflamed seborrheic keratosis: Secondary | ICD-10-CM | POA: Diagnosis not present

## 2023-12-03 DIAGNOSIS — D225 Melanocytic nevi of trunk: Secondary | ICD-10-CM | POA: Diagnosis not present

## 2023-12-12 ENCOUNTER — Other Ambulatory Visit: Payer: Self-pay | Admitting: Family Medicine

## 2023-12-13 DIAGNOSIS — M47812 Spondylosis without myelopathy or radiculopathy, cervical region: Secondary | ICD-10-CM | POA: Diagnosis not present

## 2023-12-18 ENCOUNTER — Telehealth: Payer: Self-pay | Admitting: Family Medicine

## 2023-12-18 NOTE — Telephone Encounter (Unsigned)
 Copied from CRM [id number]. Topic: General - Other >> Dec 18, 2023 10:42 AM Eunice Blase wrote: Reason for CRM: Pt called stated returning Karen's call. I did not see any notes. Please call pt at [phone].

## 2023-12-20 ENCOUNTER — Ambulatory Visit: Admitting: Physical Therapy

## 2023-12-20 ENCOUNTER — Encounter: Payer: Self-pay | Admitting: Physical Therapy

## 2023-12-20 DIAGNOSIS — M542 Cervicalgia: Secondary | ICD-10-CM | POA: Diagnosis not present

## 2023-12-20 DIAGNOSIS — M6281 Muscle weakness (generalized): Secondary | ICD-10-CM

## 2023-12-20 NOTE — Therapy (Signed)
 OUTPATIENT PHYSICAL THERAPY CERVICAL EVALUATION   Patient Name: Joshua [name]. MRN: [id number] DOB:[birth year], 78 y.o., male Today's Date: 12/20/2023  END OF SESSION:  PT End of Session - 12/20/23 1438     Visit Number 1    Number of Visits 15    Date for PT Re-Evaluation 03/13/24    Authorization Type UHC MCR    PT Start Time 1100    PT Stop Time 1150    PT Time Calculation (min) 50 min    Activity Tolerance Patient tolerated treatment well    Behavior During Therapy WFL for tasks assessed/performed              Past Medical History:  Diagnosis Date   Chronic anxiety    Chronic rhinitis    Claustrophobia    Diverticulosis of colon (without mention of hemorrhage)    DJD (degenerative joint disease)    OA AND PAIN RIGHT KNEE. DDD including fusions lumbar spine   GERD (gastroesophageal reflux disease)    ONLY WITH CERTAIN FOODS   History of total right knee replacement    Hx of vertigo    Hyperlipidemia    Hypertension    PTSD (post-traumatic stress disorder)    [place] V[employer]   TOS (thoracic outlet syndrome)    Saw a physical therapist--PAIN RIGHT SHOULDER- MUCH IMPROVED   Past Surgical History:  Procedure Laterality Date   ANTERIOR CERVICAL DECOMP/DISCECTOMY FUSION N/A 11/30/2015   Procedure: CERVICAL SEVEN-THORACIC ONE ANTERIOR CERVICAL DISCECTOMY/FUSION;  Surgeon: Julio Sicks, MD;  Location: [facility];  Service: Neurosurgery;  Laterality: N/A;   CATARACT EXTRACTION     right in 2024, [facility]   CERVICAL DISC SURGERY  2009   c3-4 5-6/ by Dr Dutch Quint- FUSION   COLONOSCOPY     TONSILLECTOMY     AS A CHILD   TOTAL KNEE ARTHROPLASTY Right 07/21/2014   Procedure: TOTAL RIGHT KNEE ARTHROPLASTY;  Surgeon: Shelda Pal, MD;  Location: [facility];  Service: Orthopedics;  Laterality: Right;   TOTAL KNEE ARTHROPLASTY Left 08/25/2019   Procedure: TOTAL KNEE ARTHROPLASTY;  Surgeon: Ollen Gross, MD;  Location: [facility];  Service: Orthopedics;   Laterality: Left;    Patient Active Problem List   Diagnosis Date Noted   Aortic atherosclerosis (HCC) 12/07/2020   S/P total knee arthroplasty, left 08/25/2019   S/P total knee arthroplasty 08/25/2019   Former smoker 03/26/2017   GERD (gastroesophageal reflux disease) 03/26/2017   Allergic rhinitis 03/26/2017   Cervical spondylosis with radiculopathy 11/30/2015   Insomnia 07/18/2015   TOS (thoracic outlet syndrome)    Cough 10/27/2008   Hyperlipidemia, unspecified 09/16/2008   Chronic rhinitis 09/16/2008   OA (osteoarthritis) of knee 09/16/2008   VERTIGO 09/16/2008    PCP: Shelva Majestic, MD   REFERRING PROVIDER: Mee Hives DIAG: [phone] cervical sponylosis  THERAPY DIAG:  Cervicalgia  Muscle weakness (generalized)  Rationale for Evaluation and Treatment: Rehabilitation  ONSET DATE: chronic neck pain                                          SUBJECTIVE:  SUBJECTIVE STATEMENT:He has new referral to return to PT for neck pain, he had previous PT last year with good results. He complaints of persistent neck soreness around collar area. He denies N/T except every now and then gets numbness in left index finger but does not know when this happens and does not happen very often.    PERTINENT HISTORY: YQM:[id number] fusion C3-T1, anx, claustrophobia,DJD, vertigo, PTSD,   PAIN:  NPRS scale: 7/10 upon arrival Pain location:sides of his lower neck Pain description: constant, soreness, no longer having the sharp pains he was having before starting PT last year Aggravating factors: moving his neck, working out Relieving factors: rest, meds   PRECAUTIONS: None  RED FLAGS: None     WEIGHT BEARING RESTRICTIONS: No  FALLS:  Has patient fallen in last 6 months? No   PLOF: Independent  PATIENT  GOALS: reduce, wants to try DN.   NEXT MD VISIT: around June 20th   OBJECTIVE:   DIAGNOSTIC FINDINGS:  "AP and lateral radiographs of cervical spine shows maintained cervical  lordosis intact ACDF hardware at C3-C4, C4-C5, C5-C6 and C7-T1. Facet  arthropathy noted bilaterally above the fusion at C2-C3. No fractures or  dislocations. "  PATIENT SURVEYS:  Patient-Specific Activity Scoring Scheme  "0" represents "unable to perform." "10" represents "able to perform at prior level. 0 1 2 3 4 5 6 7 8 9  10 (Date and Score)   Activity Eval     1. golf  0    2. Work out 7     Score 7/20    Total score = sum of the activity scores/number of activities Minimum detectable change (90%CI) for average score = 2 points Minimum detectable change (90%CI) for single activity score = 3 points     COGNITION: Overall cognitive status: Within functional limits for tasks assessed  PALPATION: Tender to palpation with pain rt sided cervical paraspinals and upper trap   CERVICAL ROM:   Active ROM A/PROM (deg) eval  Flexion 30  Extension 20  Right lateral flexion 20  Left lateral flexion 25  Right rotation 50  Left rotation 45   (Blank rows = not tested)  Neck strength: Eval:5/5 except extension 4/5 MMT  UPPER EXTREMITY ROM:WFL grossly bilat    UPPER EXTREMITY MMT:  MMT Right eval Left eval  Shoulder flexion 4 5  Shoulder extension    Shoulder abduction 5 5  Shoulder adduction    Shoulder extension    Shoulder internal rotation 5 5  Shoulder external rotation 4+ 5  Middle trapezius    Lower trapezius    Elbow flexion 5 5  Elbow extension 5 5  Wrist flexion    Wrist extension    Wrist ulnar deviation    Wrist radial deviation    Wrist pronation    Wrist supination    Grip strength WFL WFL   (Blank rows = not tested)   TODAY'S TREATMENT:  Eval HEP creation and review with demonstration and trial set preformed, see below for details Trigger Point Dry Needling  (not included in billable time) Initial Treatment: Pt instructed on Dry Needling rational, procedures, and possible side effects. Pt instructed to expect mild to moderate muscle soreness later in the day and/or into the next day.  Pt instructed in methods to reduce muscle soreness. Pt instructed to continue prescribed HEP. Patient verbalized understanding of these instructions and education.  Patient Verbal Consent Given: Yes Education Handout Provided: Previously Provided, last year Muscles Treated: cevical/thoracic multifidi bilat,  upper traps bilat Electrical Stimulation Performed: Yes, Parameters:   Yes used micro amp current at 100 frequency to level tolerated as well as milli amp current at frequency 2 to level tolerated Treatment Response/Outcome:  good overall tolerance,twitch response noted    PATIENT EDUCATION: Education details: HEP, PT plan of care Person educated: Patient Education method: Explanation, Demonstration, Verbal cues, and Handouts Education comprehension: verbalized understanding and needs further education   HOME EXERCISE PROGRAM: Access Code: [email or web address] URL: [email or web address] Date: 05/17/2023 Prepared by: Ivery Quale  Exercises - Seated Assisted Cervical Rotation with Towel  - 2 x daily - 6 x weekly - 1-2 sets - 10 reps - 5 hold - Seated Levator Scapulae Stretch  - 2 x daily - 6 x weekly - 1 sets - 3 reps - 15 hold - Seated Cervical Sidebending Stretch  - 2 x daily - 6 x weekly - 1 sets - 3 reps - 15 hold - Seated Cervical Retraction  - 2 x daily - 6 x weekly - 1 sets - 10 reps - Standing Shoulder Row with Anchored Resistance  - 2 x daily - 6 x weekly - 2-3 sets - 10 reps - Shoulder External Rotation and Scapular Retraction with Resistance  - 2 x daily - 6 x weekly - 3 sets - 10 reps  ASSESSMENT:  CLINICAL IMPRESSION: Patient referred to PT for chronic neck pain. He is post op cervical fusion C3-T1. He has cervical hypomobility as well  as neck muscular pain and tightness with trigger points. He has had good success in past with DN combined with Estim to this was continued today. Patient will benefit from skilled PT to address below impairments, limitations and improve overall function.  OBJECTIVE IMPAIRMENTS: decreased activity tolerance, decreased neck mobility, decreased ROM, decreased strength, impaired flexibility,  postural dysfunction, and pain.  ACTIVITY LIMITATIONS: tuning his head, lifting, driving PERSONAL FACTORS: [id number] fusion C3-T1, anx, claustrophobia,DJD, vertigo, PTSD, also affecting patient's functional outcome.  REHAB POTENTIAL: Good  CLINICAL DECISION MAKING: Stable/uncomplicated  EVALUATION COMPLEXITY: Low    GOALS: Short term PT Goals Target date: 01/17/2024 Pt will be I and compliant with HEP. Baseline:  Goal status: New Pt will decrease pain/soreness to overall less than 6/10 Baseline:7 Goal status: New  Long term PT goals Target date:07/12/2023   Pt will improve neck AROM >10 deg each plane to improve functional mobility.  Baseline: Goal status: New  Pt will improve PSFS to at least 9/20 functional to show improved function Baseline:7/20 Goal status: New Pt will reduce pain to overall less than 5/10 with usual activity and turning his head Baseline:7 Goal status: New      4. Pt will improve neck strength to 5/5 MMT all planes  Goal status: New  PLAN: PT FREQUENCY: 1-2 times per week   PT DURATION: 12 weeks  PLANNED INTERVENTIONS (unless contraindicated): aquatic PT, Canalith repositioning, cryotherapy, Electrical stimulation, Iontophoresis with 4 mg/ml dexamethasome, Moist heat, traction, Ultrasound, gait training, Therapeutic exercise, balance training, neuromuscular re-education, patient/family education, prosthetic training, manual techniques, passive ROM, dry needling, taping, vasopnuematic device, vestibular, spinal manipulations, joint manipulations  PLAN FOR NEXT  SESSION: review HEP, DN with estim  if helpful   Joshua [name], PT,DPT 12/20/2023, 2:40 PM   Date of referral: 12/13/23 Referring provider: Lelon Perla, MD Referring diagnosis? 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 (if different than referring diagnosis) M54.2  What was this (referring dx) caused by? Surgery (Type: cervical fusion), Ongoing Issue, and Arthritis  Nature of Condition: Recurrent (  multiple episodes of < 3 months)   Laterality: Both  Current Functional Measure Score: Other   Patient-Specific Activity Scoring Scheme  "0" represents "unable to perform." "10" represents "able to perform at prior level. 0 1 2 3 4 5 6 7 8 9  10 (Date and Score)   Activity Eval     1. golf  0    2. Work out 7     Score 7/20    Objective measurements identify impairments when they are compared to normal values, the uninvolved extremity, and prior level of function.  [x]  Yes  []  No  Objective assessment of functional ability: Moderate functional limitations   Briefly describe symptoms: neck pain and stiffness  How did symptoms start: OA, surgery  Average pain intensity:  Last 24 hours: 7/10  Past week: 7/10  How often does the pt experience symptoms? Constantly  How much have the symptoms interfered with usual daily activities? Quite a bit  How has condition changed since care began at this facility? NA - initial visit  In general, how is the patients overall health? Very Good   BACK PAIN (STarT Back Screening Tool) No

## 2023-12-24 ENCOUNTER — Encounter: Payer: Self-pay | Admitting: Family Medicine

## 2023-12-24 ENCOUNTER — Ambulatory Visit (INDEPENDENT_AMBULATORY_CARE_PROVIDER_SITE_OTHER)

## 2023-12-24 VITALS — Ht 72.0 in | Wt 185.0 lb

## 2023-12-24 DIAGNOSIS — Z0000 Encounter for general adult medical examination without abnormal findings: Secondary | ICD-10-CM

## 2023-12-24 NOTE — Progress Notes (Signed)
 Subjective:   Joshua [name]. is a 78 y.o. who presents for a Medicare Wellness preventive visit.  Visit Complete: Virtual I connected with  Joshua [name]. on 12/24/23 by a audio enabled telemedicine application and verified that I am speaking with the correct person using two identifiers.  Patient Location: Home  Provider Location: Office/Clinic  I discussed the limitations of evaluation and management by telemedicine. The patient expressed understanding and agreed to proceed.  Vital Signs: Because this visit was a virtual/telehealth visit, some criteria may be missing or patient reported. Any vitals not documented were not able to be obtained and vitals that have been documented are patient reported.  VideoDeclined- This patient declined Librarian, academic. Therefore the visit was completed with audio only.  Persons Participating in Visit: Patient.  AWV Questionnaire: No: Patient Medicare AWV questionnaire was not completed prior to this visit.  Cardiac Risk Factors include: advanced age (>74men, >77 women);dyslipidemia;male gender     Objective:    Today's Vitals   12/24/23 1541  Weight: 185 lb (83.9 kg)  Height: 6' (1.829 m)   Body mass index is 25.09 kg/m.     12/24/2023    3:45 PM 12/20/2023    2:38 PM 05/17/2023   10:36 AM 07/28/2022    8:35 AM 10/23/2019    2:21 PM 08/25/2019    8:47 PM 08/20/2019   10:13 AM  Advanced Directives  Does Patient Have a Medical Advance Directive? Yes Yes Yes Yes Yes Yes Yes  Type of Estate agent of [place];Living will Healthcare Power of [place];Living will Healthcare Power of [place];Living will Healthcare Power of [place];Living will Healthcare Power of [place];Living will Healthcare Power of [place];Living will Healthcare Power of [place];Living will  Does patient want to make changes to medical advance directive? No - Patient declined   No - Patient declined No - Patient  declined No - Patient declined   Copy of Healthcare Power of Attorney in Chart? Yes - validated most recent copy scanned in chart (See row information)   Yes - validated most recent copy scanned in chart (See row information) Yes - validated most recent copy scanned in chart (See row information) No - copy requested     Current Medications (verified) Outpatient Encounter Medications as of 12/24/2023  Medication Sig   FLUZONE HIGH-DOSE 0.5 ML injection    rosuvastatin (CRESTOR) 20 MG tablet TAKE 1 TABLET ONE TIME PER WEEK   [DISCONTINUED] gabapentin (NEURONTIN) 300 MG capsule Take 1 capsule (300 mg total) by mouth 3 (three) times daily.   [DISCONTINUED] Melatonin 10 MG TABS Take 10 mg by mouth at bedtime.   [DISCONTINUED] meloxicam (MOBIC) 15 MG tablet Take 1 tablet (15 mg total) by mouth daily.   [DISCONTINUED] predniSONE (DELTASONE) 50 MG tablet Take 1 tablet (50 mg total) by mouth daily with breakfast. Take until completed.   No facility-administered encounter medications on file as of 12/24/2023.    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   GERD (gastroesophageal reflux disease)    ONLY WITH CERTAIN FOODS   History of total right knee replacement    Hx of vertigo    Hyperlipidemia    Hypertension  PTSD (post-traumatic stress disorder)    [place] VETERAN   TOS (thoracic outlet syndrome)    Saw a physical therapist--PAIN RIGHT SHOULDER- MUCH IMPROVED   Past Surgical History:  Procedure Laterality Date   ANTERIOR CERVICAL DECOMP/DISCECTOMY FUSION N/A 11/30/2015   Procedure: CERVICAL SEVEN-THORACIC ONE ANTERIOR CERVICAL DISCECTOMY/FUSION;  Surgeon: Julio Sicks, MD;  Location: [facility];  Service: Neurosurgery;  Laterality:  N/A;   CATARACT EXTRACTION     right in 2024, [facility]   CERVICAL DISC SURGERY  2009   c3-4 5-6/ by Dr Dutch Quint- FUSION   COLONOSCOPY     TONSILLECTOMY     AS A CHILD   TOTAL KNEE ARTHROPLASTY Right 07/21/2014   Procedure: TOTAL RIGHT KNEE ARTHROPLASTY;  Surgeon: Shelda Pal, MD;  Location: [facility];  Service: Orthopedics;  Laterality: Right;   TOTAL KNEE ARTHROPLASTY Left 08/25/2019   Procedure: TOTAL KNEE ARTHROPLASTY;  Surgeon: Ollen Gross, MD;  Location: [facility];  Service: Orthopedics;  Laterality: Left;    Family History  Problem Relation Age of Onset   Other Mother        81- "old age". long health in grandparents as well   Other Father        75- "old age"   Colon polyps Father        adenomas   Healthy Sister    Colon cancer Paternal Aunt    Social History   Socioeconomic History   Marital status: Married    Spouse name: Not on file   Number of children: Not on file   Years of education: Not on file   Highest education level: Not on file  Occupational History   Occupation: Retired    Comment: Transport planner  Tobacco Use   Smoking status: Former    Current packs/day: 0.00    Average packs/day: 0.5 packs/day for 5.0 years (2.5 ttl pk-yrs)    Types: Cigarettes    Start date: 09/25/1968    Quit date: 09/25/1973    Years since quitting: 50.2   Smokeless tobacco: Never  Vaping Use   Vaping status: Never Used  Substance and Sexual Activity   Alcohol use: Yes    Alcohol/week: 7.0 standard drinks of alcohol    Types: 7 Standard drinks or equivalent per week    Comment:  2 drinks per night weekend, vodka & grapefruit juice    Drug use: No   Sexual activity: Yes  Other Topics Concern   Not on file  Social History Narrative   Married 1982. 1 daughter. No grandkids yet (daughter married 2018)      Retired from [employer] 37 years. [employer] from [place] to 1970 while in college.    Went to [employer] in [employer].    Goes to [employer].       Hobbies: golf, work out at [employer] at [place], place up at [employer].    Social Drivers of Corporate investment banker Strain: Low Risk  (12/24/2023)   Overall Financial Resource Strain (CARDIA)    Difficulty of Paying Living Expenses: Not hard at all  Food Insecurity: No Food Insecurity (12/24/2023)   Hunger Vital Sign    Worried About Running Out of Food in the Last Year: Never true    Ran Out of Food in the Last Year: Never true  Transportation Needs: No Transportation Needs (12/24/2023)   PRAPARE - Administrator, Civil Service (Medical): No  Lack of Transportation (Non-Medical): No  Physical Activity: Sufficiently Active (12/24/2023)   Exercise Vital Sign    Days of Exercise per Week: 3 days    Minutes of Exercise per Session: 120 min  Stress: No Stress Concern Present (12/24/2023)   [employer] of Occupational Health - Occupational Stress Questionnaire    Feeling of Stress : Not at all  Social Connections: Socially Integrated (12/24/2023)   Social Connection and Isolation Panel [NHANES]    Frequency of Communication with Friends and Family: More than three times a week    Frequency of Social Gatherings with Friends and Family: More than three times a week    Attends Religious Services: More than 4 times per year    Active Member of [employer] or Organizations: Yes    Attends Banker Meetings: 1 to 4 times per year    Marital Status: Married    Tobacco Counseling Counseling given: Not Answered    Clinical Intake:  Pre-visit preparation completed: Yes  Pain : No/denies pain     BMI - recorded: 25.09 Nutritional Status: BMI 25 -29 Overweight Nutritional Risks: None Diabetes: No  Lab Results  Component Value Date   HGBA1C 5.1 07/14/2019     How often do you need to have someone help you when you read instructions, pamphlets, or other written materials from your doctor or pharmacy?: 1 - Never  Interpreter Needed?:  No  Information entered by :: Joshua Ensign, LPN   Activities of Daily Living     12/24/2023    3:42 PM  In your present state of health, do you have any difficulty performing the following activities:  Hearing? 1  Comment hearing aids  Vision? 0  Difficulty concentrating or making decisions? 0  Walking or climbing stairs? 0  Dressing or bathing? 0  Doing errands, shopping? 0  Preparing Food and eating ? N  Using the Toilet? N  In the past six months, have you accidently leaked urine? N  Do you have problems with loss of bowel control? N  Managing your Medications? N  Managing your Finances? N  Housekeeping or managing your Housekeeping? N    Patient Care Team: Shelva Majestic, MD as PCP - General (Family Medicine) Ollen Gross, MD as Consulting Physician (Orthopedic Surgery) Donzetta Starch, MD as Consulting Physician (Dermatology) Nyoka Cowden, MD as Consulting Physician (Pulmonary Disease) Julio Sicks, MD as Consulting Physician (Neurosurgery) Clinic, Lenn Sink as Consulting Physician  Indicate any recent Medical Services you may have received from other than [facility] providers in the past year (date may be approximate).     Assessment:   This is a routine wellness examination for Joshua [name].  Hearing/Vision screen Hearing Screening - Comments:: Pt has hearing aids  Vision Screening - Comments:: Wears rx glasses - [facility]    Goals Addressed             This Visit's Progress    Patient Stated       Maintain health and activity        Depression Screen     12/24/2023    3:46 PM 11/02/2022   11:01 AM 07/28/2022    8:34 AM 10/31/2021   10:23 AM 10/28/2020   10:27 AM 09/03/2020   12:56 PM 10/23/2019    2:22 PM  PHQ 2/9 Scores  PHQ - 2 Score 0 0 0 0 0 0 0  PHQ- 9 Score  2  2       Fall Risk     12/24/2023    3:50 PM 11/02/2022   11:01 AM 07/28/2022    8:35 AM 10/31/2021   10:23 AM 10/28/2020   10:27 AM  Fall  Risk   Falls in the past year? 0 0 0 0 0  Number falls in past yr: 0 0 0 0 0  Injury with Fall? 0 0 0 0 0  Risk for fall due to : No Fall Risks No Fall Risks Impaired vision    Follow up Falls prevention discussed Falls evaluation completed Falls prevention discussed      MEDICARE RISK AT HOME:   Medicare Risk at Home Any stairs in or around the home?: Yes If so, are there any without handrails?: No Home free of loose throw rugs in walkways, pet beds, electrical cords, etc?: Yes Adequate lighting in your home to reduce risk of falls?: Yes Life alert?: No Use of a cane, walker or w/c?: No Grab bars in the bathroom?: Yes Shower chair or bench in shower?: Yes Elevated toilet seat or a handicapped toilet?: Yes  TIMED UP AND GO:  Was the test performed?  No  Cognitive Function: 6CIT completed    10/16/2018    3:58 PM 10/04/2017    9:59 AM  MMSE - Mini Mental State Exam  Not completed:  --  Orientation to time 5   Orientation to Place 5   Registration 3   Attention/ Calculation 4   Recall 2   Language- name 2 objects 2   Language- repeat 1   Language- follow 3 step command 3   Language- read & follow direction 1   Write a sentence 1   Copy design 1   Total score 28         12/24/2023    3:53 PM 07/28/2022    8:39 AM 10/23/2019    2:22 PM  6CIT Screen  What Year? 0 points 0 points 0 points  What month? 0 points 0 points 0 points  What time? 0 points 0 points 0 points  Count back from 20 0 points 0 points 0 points  Months in reverse 0 points 0 points 0 points  Repeat phrase 0 points 2 points 0 points  Total Score 0 points 2 points 0 points    Immunizations Immunization History  Administered Date(s) Administered   Fluad Quad(high Dose 65+) 07/14/2019, 07/26/2021, 07/13/2022   Influenza Split 07/26/2011, 07/02/2015   Influenza Whole 06/25/2012   Influenza, High Dose Seasonal PF 07/19/2017, 08/07/2023   Influenza,inj,Quad PF,6+ Mos 07/08/2013, 07/20/2014    Influenza,inj,quad, With Preservative 07/16/2019   Influenza-Unspecified 08/01/2007, 07/01/2008, 06/25/2009, 06/08/2010, 05/27/2011, 07/02/2012, 05/26/2013, 07/04/2018, 08/26/2019, 05/31/2020, 05/26/2021, 07/13/2022   Moderna Sars-Covid-2 Vaccination 02/04/2020, 03/03/2020   Pfizer Covid-19 Vaccine Bivalent Booster 53yrs & up 09/20/2021   Pneumococcal Conjugate-13 07/16/2014, 07/16/2015   Pneumococcal Polysaccharide-23 06/25/2006, 11/10/2010, 10/26/2016   Pneumococcal-Unspecified 09/25/2004   Td 11/23/2004   Tdap 11/22/2011, 07/16/2015   Zoster Recombinant(Shingrix) 04/17/2017, 06/19/2017, 07/02/2017, 09/03/2017   Zoster, Live 03/06/2011    Screening Tests Health Maintenance  Topic Date Due   Medicare Annual Wellness (AWV)  12/23/2024   DTaP/Tdap/Td (4 - Td or Tdap) 07/15/2025   Pneumonia Vaccine 56+ Years old  Completed   INFLUENZA VACCINE  Completed   Hepatitis C Screening  Completed   Zoster Vaccines- Shingrix  Completed   HPV VACCINES  Aged Out   Colonoscopy  Discontinued   COVID-19 Vaccine  Discontinued  Health Maintenance  There are no preventive care reminders to display for this patient.  Health Maintenance Items Addressed: See Nurse Notes  Additional Screening:  Vision Screening: Recommended annual ophthalmology exams for early detection of glaucoma and other disorders of the eye.  Dental Screening: Recommended annual dental exams for proper oral hygiene  Community Resource Referral / Chronic Care Management: CRR required this visit?  No   CCM required this visit?  No     Plan:     I have personally reviewed and noted the following in the patient's chart:   Medical and social history Use of alcohol, tobacco or illicit drugs  Current medications and supplements including opioid prescriptions. Patient is not currently taking opioid prescriptions. Functional ability and status Nutritional status Physical activity Advanced directives List of other  physicians Hospitalizations, surgeries, and ER visits in previous 12 months Vitals Screenings to include cognitive, depression, and falls Referrals and appointments  In addition, I have reviewed and discussed with patient certain preventive protocols, quality metrics, and best practice recommendations. A written personalized care plan for preventive services as well as general preventive health recommendations were provided to patient.     Marzella Schlein, LPN   06/08/7828   After Visit Summary:Pt stated he will reach out via my chart to discuss cramps in legs he believes may be from medication

## 2024-01-09 ENCOUNTER — Ambulatory Visit: Admitting: Physical Therapy

## 2024-01-09 ENCOUNTER — Encounter: Payer: Self-pay | Admitting: Physical Therapy

## 2024-01-09 DIAGNOSIS — M542 Cervicalgia: Secondary | ICD-10-CM

## 2024-01-09 DIAGNOSIS — M6281 Muscle weakness (generalized): Secondary | ICD-10-CM | POA: Diagnosis not present

## 2024-01-09 NOTE — Therapy (Signed)
 OUTPATIENT PHYSICAL THERAPY CERVICAL EVALUATION   Patient Name: Joshua [name]. MRN: [id number] DOB:July 12, 1946, 78 y.o., male Today's Date: 01/09/2024  END OF SESSION:  PT End of Session - 01/09/24 1614     Visit Number 2    Number of Visits 15    Date for PT Re-Evaluation 03/13/24    Authorization Type [employer] MCR    PT Start Time 1515    PT Stop Time 1555    PT Time Calculation (min) 40 min    Activity Tolerance Patient tolerated treatment well    Behavior During Therapy WFL for tasks assessed/performed               Past Medical History:  Diagnosis Date   Chronic anxiety    Chronic rhinitis    Claustrophobia    Diverticulosis of colon (without mention of hemorrhage)    DJD (degenerative joint disease)    OA AND PAIN RIGHT KNEE. DDD including fusions lumbar spine   GERD (gastroesophageal reflux disease)    ONLY WITH CERTAIN FOODS   History of total right knee replacement    Hx of vertigo    Hyperlipidemia    Hypertension    PTSD (post-traumatic stress disorder)    [place] VET[employer]   TOS (thoracic outlet syndrome)    Saw a physical therapist--PAIN RIGHT SHOULDER- MUCH IMPROVED   Past Surgical History:  Procedure Laterality Date   ANTERIOR CERVICAL DECOMP/DISCECTOMY FUSION N/A 11/30/2015   Procedure: CERVICAL SEVEN-THORACIC ONE ANTERIOR CERVICAL DISCECTOMY/FUSION;  Surgeon: Agustina Aldrich, MD;  Location: [facility];  Service: Neurosurgery;  Laterality: N/A;   CATARACT EXTRACTION     [facility] in [facility]24[facility]   CERVICAL DISC SURGERY  2009   c3-4 5-6/ by Dr Gwendlyn Lemmings- FUSION   COLONOSCOPY     TONSILLECTOMY     AS A CHILD   TOTAL KNEE ARTHROPLASTY Right 07/21/2014   Procedure: TOTAL RIGHT KNEE ARTHROPLASTY;  Surgeon: Bevin Bucks, MD;  Location: [facility];  Service: Orthopedics;  Laterality: Right;   TOTAL KNEE ARTHROPLASTY Left 08/25/2019   Procedure: TOTAL KNEE ARTHROPLASTY;  Surgeon: Liliane Rei, MD;  Location: [facility];  Service: Orthopedics;   Laterality: Left;    Patient Active Problem List   Diagnosis Date Noted   Aortic atherosclerosis (HCC) 12/07/2020   S/P total knee arthroplasty, left 08/25/2019   S/P total knee arthroplasty 08/25/2019   Former smoker 03/26/2017   GERD (gastroesophageal reflux disease) 03/26/2017   Allergic rhinitis 03/26/2017   Cervical spondylosis with radiculopathy 11/30/2015   Insomnia 07/18/2015   TOS (thoracic outlet syndrome)    Cough 10/27/2008   Hyperlipidemia, unspecified 09/16/2008   Chronic rhinitis 09/16/2008   OA (osteoarthritis) of knee 09/16/2008   VERTIGO 09/16/2008    PCP: Almira Jaeger, MD   REFERRING PROVIDER: Virgie Griffith DIAG: [id number] cervical sponylosis  THERAPY DIAG:  Cervicalgia  Muscle weakness (generalized)  Rationale for Evaluation and Treatment: Rehabilitation  ONSET DATE: chronic neck pain                                          SUBJECTIVE:  SUBJECTIVE STATEMENT:He reports some soreness in his neck after using post hole digger to help with project at church. Soreness has been constant 5/10 in bilat neck/upper trap area.   PERTINENT HISTORY: ZOX:WRUEAVWU fusion C3-T1, anx, claustrophobia,DJD, vertigo, PTSD,   PAIN:  NPRS scale: 5/10 upon arrival Pain location:sides of his lower neck Pain description: constant, soreness, no longer having the sharp pains he was having before starting PT last year Aggravating factors: moving his neck, working out Relieving factors: rest, meds   PRECAUTIONS: None  RED FLAGS: None     WEIGHT BEARING RESTRICTIONS: No  FALLS:  Has patient fallen in last 6 months? No   PLOF: Independent  PATIENT GOALS: reduce, wants to try DN.   NEXT MD VISIT: around June 20th   OBJECTIVE:   DIAGNOSTIC FINDINGS:  "AP and lateral radiographs of  cervical spine shows maintained cervical  lordosis intact ACDF hardware at C3-C4, C4-C5, C5-C6 and C7-T1. Facet  arthropathy noted bilaterally above the fusion at C2-C3. No fractures or  dislocations. "  PATIENT SURVEYS:  Patient-Specific Activity Scoring Scheme  "0" represents "unable to perform." "10" represents "able to perform at prior level. 0 1 2 3 4 5 6 7 8 9  10 (Date and Score)   Activity Eval     1. golf  0    2. Work out 7     Score 7/20    Total score = sum of the activity scores/number of activities Minimum detectable change (90%CI) for average score = 2 points Minimum detectable change (90%CI) for single activity score = 3 points     COGNITION: Overall cognitive status: Within functional limits for tasks assessed  PALPATION: Tender to palpation with pain rt sided cervical paraspinals and upper trap   CERVICAL ROM:   Active ROM A/PROM (deg) eval  Flexion 30  Extension 20  Right lateral flexion 20  Left lateral flexion 25  Right rotation 50  Left rotation 45   (Blank rows = not tested)  Neck strength: Eval:5/5 except extension 4/5 MMT  UPPER EXTREMITY ROM:WFL grossly bilat    UPPER EXTREMITY MMT:  MMT Right eval Left eval  Shoulder flexion 4 5  Shoulder extension    Shoulder abduction 5 5  Shoulder adduction    Shoulder extension    Shoulder internal rotation 5 5  Shoulder external rotation 4+ 5  Middle trapezius    Lower trapezius    Elbow flexion 5 5  Elbow extension 5 5  Wrist flexion    Wrist extension    Wrist ulnar deviation    Wrist radial deviation    Wrist pronation    Wrist supination    Grip strength WFL WFL   (Blank rows = not tested)   TODAY'S TREATMENT:  01/09/24 HEP review with cues and demo for towel stretch AAROM for cervical rotaiton Then performed 10 reps of this 5 sec each Discussed way to modify this using his hand to push into rotation stretch vs using towel as he tends to tense up doing this with  towe  Manual therapy for skilled palpation and active compression applied during Trigger Point Dry Needling  Subsequent Treatment: education provided at first treatment Patient Verbal Consent Given: Yes Education Handout Provided: Previously Provided Muscles Treated: cevical/thoracic multifidi bilat, upper traps bilat Electrical Stimulation Performed: Yes, Parameters:   Yes used micro amp current at 100 frequency to level tolerated as well as milli amp current at frequency 2 to level tolerated Treatment Response/Outcome:  good overall  tolerance,twitch response noted    PATIENT EDUCATION: Education details: HEP, PT plan of care Person educated: Patient Education method: Explanation, Demonstration, Verbal cues, and Handouts Education comprehension: verbalized understanding and needs further education   HOME EXERCISE PROGRAM: Access Code: [email or web address] URL: [email or web address] Date: 05/17/2023 Prepared by: Ivery Quale  Exercises - Seated Assisted Cervical Rotation with Towel  - 2 x daily - 6 x weekly - 1-2 sets - 10 reps - 5 hold - Seated Levator Scapulae Stretch  - 2 x daily - 6 x weekly - 1 sets - 3 reps - 15 hold - Seated Cervical Sidebending Stretch  - 2 x daily - 6 x weekly - 1 sets - 3 reps - 15 hold - Seated Cervical Retraction  - 2 x daily - 6 x weekly - 1 sets - 10 reps - Standing Shoulder Row with Anchored Resistance  - 2 x daily - 6 x weekly - 2-3 sets - 10 reps - Shoulder External Rotation and Scapular Retraction with Resistance  - 2 x daily - 6 x weekly - 3 sets - 10 reps  ASSESSMENT:  CLINICAL IMPRESSION: He arrives with more pain and soreness in his neck which likely has something to do with using post hole digger. He did have great response with electrical stimulation today with dry needling and reports it feels a lot better leaving session.  OBJECTIVE IMPAIRMENTS: decreased activity tolerance, decreased neck mobility, decreased ROM, decreased strength,  impaired flexibility,  postural dysfunction, and pain.  ACTIVITY LIMITATIONS: tuning his head, lifting, driving PERSONAL FACTORS: ZOX:WRUEAVWU fusion C3-T1, anx, claustrophobia,DJD, vertigo, PTSD, also affecting patient's functional outcome.  REHAB POTENTIAL: Good  CLINICAL DECISION MAKING: Stable/uncomplicated  EVALUATION COMPLEXITY: Low    GOALS: Short term PT Goals Target date: 01/17/2024 Pt will be I and compliant with HEP. Baseline:  Goal status: New Pt will decrease pain/soreness to overall less than 6/10 Baseline:7 Goal status: New  Long term PT goals Target date:07/12/2023   Pt will improve neck AROM >10 deg each plane to improve functional mobility.  Baseline: Goal status: New  Pt will improve PSFS to at least 9/20 functional to show improved function Baseline:7/20 Goal status: New Pt will reduce pain to overall less than 5/10 with usual activity and turning his head Baseline:7 Goal status: New      4. Pt will improve neck strength to 5/5 MMT all planes  Goal status: New  PLAN: PT FREQUENCY: 1-2 times per week   PT DURATION: 12 weeks  PLANNED INTERVENTIONS (unless contraindicated): aquatic PT, Canalith repositioning, cryotherapy, Electrical stimulation, Iontophoresis with 4 mg/ml dexamethasome, Moist heat, traction, Ultrasound, gait training, Therapeutic exercise, balance training, neuromuscular re-education, patient/family education, prosthetic training, manual techniques, passive ROM, dry needling, taping, vasopnuematic device, vestibular, spinal manipulations, joint manipulations  PLAN FOR NEXT SESSION: neck mobility, DN with estim  if helpful   April Manson, PT,DPT 01/09/2024, 4:14 PM   Date of referral: 12/13/23 Referring provider: Lelon Perla, MD Referring diagnosis? 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 (if different than referring diagnosis) M54.2  What was this (referring dx) caused by? Surgery (Type: cervical fusion), Ongoing  Issue, and Arthritis  Nature of Condition: Recurrent (multiple episodes of < 3 months)   Laterality: Both  Current Functional Measure Score: Other   Patient-Specific Activity Scoring Scheme  "0" represents "unable to perform." "10" represents "able to perform at prior level. 0 1 2 3 4 5 6 7 8 9  10 (Date and Score)   Activity Eval  1. golf  0    2. Work out 7     Score 7/20    Objective measurements identify impairments when they are compared to normal values, the uninvolved extremity, and prior level of function.  [x]  Yes  []  No  Objective assessment of functional ability: Moderate functional limitations   Briefly describe symptoms: neck pain and stiffness  How did symptoms start: OA, surgery  Average pain intensity:  Last 24 hours: 7/10  Past week: 7/10  How often does the pt experience symptoms? Constantly  How much have the symptoms interfered with usual daily activities? Quite a bit  How has condition changed since care began at this facility? NA - initial visit  In general, how is the patients overall health? Very Good   BACK PAIN (STarT Back Screening Tool) No

## 2024-01-14 ENCOUNTER — Encounter: Admitting: Physical Therapy

## 2024-01-16 ENCOUNTER — Ambulatory Visit: Admitting: Physical Therapy

## 2024-01-16 ENCOUNTER — Encounter: Payer: Self-pay | Admitting: Physical Therapy

## 2024-01-16 DIAGNOSIS — M6281 Muscle weakness (generalized): Secondary | ICD-10-CM | POA: Diagnosis not present

## 2024-01-16 DIAGNOSIS — M542 Cervicalgia: Secondary | ICD-10-CM | POA: Diagnosis not present

## 2024-01-16 NOTE — Therapy (Signed)
 OUTPATIENT PHYSICAL THERAPY TREATMENT   Patient Name: Joshua [name]. MRN: [id number] DOB:[birth year], 78 y.o., male Today's Date: 01/16/2024  END OF SESSION:  PT End of Session - 01/16/24 1200     Visit Number 3    Number of Visits 15    Date for PT Re-Evaluation 03/13/24    Authorization Type [employer] MCR    PT Start Time 1100    PT Stop Time 1140    PT Time Calculation (min) 40 min    Activity Tolerance Patient tolerated treatment well    Behavior During Therapy WFL for tasks assessed/performed                Past Medical History:  Diagnosis Date   Chronic anxiety    Chronic rhinitis    Claustrophobia    Diverticulosis of colon (without mention of hemorrhage)    DJD (degenerative joint disease)    OA AND PAIN RIGHT KNEE. DDD including fusions lumbar spine   GERD (gastroesophageal reflux disease)    ONLY WITH CERTAIN FOODS   History of total right knee replacement    Hx of vertigo    Hyperlipidemia    Hypertension    PTSD (post-traumatic stress disorder)    [place] VET[employer]   TOS (thoracic outlet syndrome)    Saw a physical therapist--PAIN RIGHT SHOULDER- MUCH IMPROVED   Past Surgical History:  Procedure Laterality Date   ANTERIOR CERVICAL DECOMP/DISCECTOMY FUSION N/A 11/30/2015   Procedure: CERVICAL SEVEN-THORACIC ONE ANTERIOR CERVICAL DISCECTOMY/FUSION;  Surgeon: Agustina Aldrich, MD;  Location: [facility];  Service: Neurosurgery;  Laterality: N/A;   CATARACT EXTRACTION     right in 2024[facility]   CERVICAL DISC SURGERY  2009   c3-4 5-6/ by Dr Gwendlyn Lemmings- FUSION   COLONOSCOPY     TONSILLECTOMY     AS A CHILD   TOTAL KNEE ARTHROPLASTY Right 07/21/2014   Procedure: TOTAL RIGHT KNEE ARTHROPLASTY;  Surgeon: Bevin Bucks, MD;  Location: [facility];  Service: Orthopedics;  Laterality: Right;   TOTAL KNEE ARTHROPLASTY Left 08/25/2019   Procedure: TOTAL KNEE ARTHROPLASTY;  Surgeon: Liliane Rei, MD;  Location: [facility];  Service: Orthopedics;  Laterality:  Left;    Patient Active Problem List   Diagnosis Date Noted   Aortic atherosclerosis (HCC) 12/07/2020   S/P total knee arthroplasty, left 08/25/2019   S/P total knee arthroplasty 08/25/2019   Former smoker 03/26/2017   GERD (gastroesophageal reflux disease) 03/26/2017   Allergic rhinitis 03/26/2017   Cervical spondylosis with radiculopathy 11/30/2015   Insomnia 07/18/2015   TOS (thoracic outlet syndrome)    Cough 10/27/2008   Hyperlipidemia, unspecified 09/16/2008   Chronic rhinitis 09/16/2008   OA (osteoarthritis) of knee 09/16/2008   VERTIGO 09/16/2008    PCP: Almira Jaeger, MD   REFERRING PROVIDER: Virgie Griffith DIAG: [id number] cervical sponylosis  THERAPY DIAG:  Cervicalgia  Muscle weakness (generalized)  Rationale for Evaluation and Treatment: Rehabilitation  ONSET DATE: chronic neck pain                                          SUBJECTIVE:  SUBJECTIVE STATEMENT:He reports the soreness is a lot better since last PT session  PERTINENT HISTORY: WJX:[id number] fusion C3-T1, anx, claustrophobia,DJD, vertigo, PTSD,   PAIN:  NPRS scale: 3/10 upon arrival Pain location:sides of his lower neck Pain description: constant, soreness, no longer having the sharp pains he was having before starting PT last year Aggravating factors: moving his neck, working out Relieving factors: rest, meds   PRECAUTIONS: None  RED FLAGS: None     WEIGHT BEARING RESTRICTIONS: No  FALLS:  Has patient fallen in last 6 months? No   PLOF: Independent  PATIENT GOALS: reduce, wants to try DN.   NEXT MD VISIT: around June 20th   OBJECTIVE:   DIAGNOSTIC FINDINGS:  "AP and lateral radiographs of cervical spine shows maintained cervical  lordosis intact ACDF hardware at C3-C4, C4-C5, C5-C6 and C7-T1. Facet   arthropathy noted bilaterally above the fusion at C2-C3. No fractures or  dislocations. "  PATIENT SURVEYS:  Patient-Specific Activity Scoring Scheme  "0" represents "unable to perform." "10" represents "able to perform at prior level. 0 1 2 3 4 5 6 7 8 9  10 (Date and Score)   Activity Eval     1. golf  0    2. Work out 7     Score 7/20    Total score = sum of the activity scores/number of activities Minimum detectable change (90%CI) for average score = 2 points Minimum detectable change (90%CI) for single activity score = 3 points     COGNITION: Overall cognitive status: Within functional limits for tasks assessed  PALPATION: Tender to palpation with pain rt sided cervical paraspinals and upper trap   CERVICAL ROM:   Active ROM A/PROM (deg) eval  Flexion 30  Extension 20  Right lateral flexion 20  Left lateral flexion 25  Right rotation 50  Left rotation 45   (Blank rows = not tested)  Neck strength: Eval:5/5 except extension 4/5 MMT  UPPER EXTREMITY ROM:WFL grossly bilat    UPPER EXTREMITY MMT:  MMT Right eval Left eval  Shoulder flexion 4 5  Shoulder extension    Shoulder abduction 5 5  Shoulder adduction    Shoulder extension    Shoulder internal rotation 5 5  Shoulder external rotation 4+ 5  Middle trapezius    Lower trapezius    Elbow flexion 5 5  Elbow extension 5 5  Wrist flexion    Wrist extension    Wrist ulnar deviation    Wrist radial deviation    Wrist pronation    Wrist supination    Grip strength WFL WFL   (Blank rows = not tested)   TODAY'S TREATMENT:  01/16/24 Cervical rotation stretch with self overpressure using hand, 5 sec hold X 10 bilat Upper trap stretch 5 sec X 10 bilat  Manual therapy for skilled palpation and active compression applied during Trigger Point Dry Needling  Subsequent Treatment: education provided at first treatment Patient Verbal Consent Given: Yes Education Handout Provided: Previously  Provided Muscles Treated: cevical/thoracic multifidi bilat, upper traps bilat Electrical Stimulation Performed: Yes, Parameters:   Yes used micro amp current at 100 frequency to level tolerated as well as milli amp current at frequency 2 to level tolerated Treatment Response/Outcome:  good overall tolerance,twitch response noted    PATIENT EDUCATION: Education details: HEP, PT plan of care Person educated: Patient Education method: Explanation, Demonstration, Verbal cues, and Handouts Education comprehension: verbalized understanding and needs further education   HOME EXERCISE PROGRAM: Access Code: [email or web address] URL:  [email or web address] Date: 05/17/2023 Prepared by: Jamee Mazzoni  Exercises - Seated Assisted Cervical Rotation with Towel  - 2 x daily - 6 x weekly - 1-2 sets - 10 reps - 5 hold - Seated Levator Scapulae Stretch  - 2 x daily - 6 x weekly - 1 sets - 3 reps - 15 hold - Seated Cervical Sidebending Stretch  - 2 x daily - 6 x weekly - 1 sets - 3 reps - 15 hold - Seated Cervical Retraction  - 2 x daily - 6 x weekly - 1 sets - 10 reps - Standing Shoulder Row with Anchored Resistance  - 2 x daily - 6 x weekly - 2-3 sets - 10 reps - Shoulder External Rotation and Scapular Retraction with Resistance  - 2 x daily - 6 x weekly - 3 sets - 10 reps  ASSESSMENT:  CLINICAL IMPRESSION: He responded well to DN treatment with Estim from last time so this was continued again today. ROM observed to be a little better as well.  OBJECTIVE IMPAIRMENTS: decreased activity tolerance, decreased neck mobility, decreased ROM, decreased strength, impaired flexibility,  postural dysfunction, and pain.  ACTIVITY LIMITATIONS: tuning his head, lifting, driving PERSONAL FACTORS: ZOX:WRUEAVWU fusion C3-T1, anx, claustrophobia,DJD, vertigo, PTSD, also affecting patient's functional outcome.  REHAB POTENTIAL: Good  CLINICAL DECISION MAKING: Stable/uncomplicated  EVALUATION COMPLEXITY:  Low    GOALS: Short term PT Goals Target date: 01/17/2024 Pt will be I and compliant with HEP. Baseline:  Goal status: New Pt will decrease pain/soreness to overall less than 6/10 Baseline:7 Goal status: New  Long term PT goals Target date:07/12/2023   Pt will improve neck AROM >10 deg each plane to improve functional mobility.  Baseline: Goal status: New  Pt will improve PSFS to at least 9/20 functional to show improved function Baseline:7/20 Goal status: New Pt will reduce pain to overall less than 5/10 with usual activity and turning his head Baseline:7 Goal status: New      4. Pt will improve neck strength to 5/5 MMT all planes  Goal status: New  PLAN: PT FREQUENCY: 1-2 times per week   PT DURATION: 12 weeks  PLANNED INTERVENTIONS (unless contraindicated): aquatic PT, Canalith repositioning, cryotherapy, Electrical stimulation, Iontophoresis with 4 mg/ml dexamethasome, Moist heat, traction, Ultrasound, gait training, Therapeutic exercise, balance training, neuromuscular re-education, patient/family education, prosthetic training, manual techniques, passive ROM, dry needling, taping, vasopnuematic device, vestibular, spinal manipulations, joint manipulations  PLAN FOR NEXT SESSION: neck mobility, DN with estim  if helpful   Mick Alamin, PT,DPT 01/16/2024, 12:01 PM   Date of referral: 12/13/23 Referring provider: Jaycee Metro, MD Referring diagnosis? 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 (if different than referring diagnosis) M54.2  What was this (referring dx) caused by? Surgery (Type: cervical fusion), Ongoing Issue, and Arthritis  Nature of Condition: Recurrent (multiple episodes of < 3 months)   Laterality: Both  Current Functional Measure Score: Other   Patient-Specific Activity Scoring Scheme  "0" represents "unable to perform." "10" represents "able to perform at prior level. 0 1 2 3 4 5 6 7 8 9  10 (Date and Score)   Activity Eval      1. golf  0    2. Work out 7     Score 7/20    Objective measurements identify impairments when they are compared to normal values, the uninvolved extremity, and prior level of function.  [x]  Yes  []  No  Objective assessment of functional ability: Moderate functional limitations   Briefly describe symptoms:  neck pain and stiffness  How did symptoms start: OA, surgery  Average pain intensity:  Last 24 hours: 7/10  Past week: 7/10  How often does the pt experience symptoms? Constantly  How much have the symptoms interfered with usual daily activities? Quite a bit  How has condition changed since care began at this facility? NA - initial visit  In general, how is the patients overall health? Very Good   BACK PAIN (STarT Back Screening Tool) No

## 2024-01-21 ENCOUNTER — Encounter: Payer: Self-pay | Admitting: Physical Therapy

## 2024-01-21 ENCOUNTER — Ambulatory Visit: Admitting: Physical Therapy

## 2024-01-21 ENCOUNTER — Encounter: Admitting: Rehabilitative and Restorative Service Providers"

## 2024-01-21 DIAGNOSIS — M542 Cervicalgia: Secondary | ICD-10-CM | POA: Diagnosis not present

## 2024-01-21 DIAGNOSIS — M6281 Muscle weakness (generalized): Secondary | ICD-10-CM

## 2024-01-21 NOTE — Therapy (Signed)
 OUTPATIENT PHYSICAL THERAPY TREATMENT   Patient Name: Joshua [name]. MRN: [id number] DOB:28-Oct-1945, 78 y.o., male Today's Date: 01/21/2024  END OF SESSION:  PT End of Session - 01/21/24 1057     Visit Number 4    Number of Visits 15    Date for PT Re-Evaluation 03/13/24    Authorization Type UHC MCR    PT Start Time 1100    PT Stop Time 1140    PT Time Calculation (min) 40 min    Activity Tolerance Patient tolerated treatment well    Behavior During Therapy WFL for tasks assessed/performed                Past Medical History:  Diagnosis Date   Chronic anxiety    Chronic rhinitis    Claustrophobia    Diverticulosis of colon (without mention of hemorrhage)    DJD (degenerative joint disease)    OA AND PAIN RIGHT KNEE. DDD including fusions lumbar spine   GERD (gastroesophageal reflux disease)    ONLY WITH CERTAIN FOODS   History of total right knee replacement    Hx of vertigo    Hyperlipidemia    Hypertension    PTSD (post-traumatic stress disorder)    [place] [employer]   TOS (thoracic outlet syndrome)    Saw a physical therapist--PAIN RIGHT SHOULDER- MUCH IMPROVED   Past Surgical History:  Procedure Laterality Date   ANTERIOR CERVICAL DECOMP/DISCECTOMY FUSION N/A 11/30/2015   Procedure: CERVICAL SEVEN-THORACIC ONE ANTERIOR CERVICAL DISCECTOMY/FUSION;  Surgeon: Agustina Aldrich, MD;  Location: [facility];  Service: Neurosurgery;  Laterality: N/A;   CATARACT EXTRACTION     [facility] in [facility]24[facility]   CERVICAL DISC SURGERY  2009   c3-4 5-6/ by Dr Gwendlyn Lemmings- FUSION   COLONOSCOPY     TONSILLECTOMY     AS A CHILD   TOTAL KNEE ARTHROPLASTY Right 07/21/2014   Procedure: TOTAL RIGHT KNEE ARTHROPLASTY;  Surgeon: Bevin Bucks, MD;  Location: [facility];  Service: Orthopedics;  Laterality: Right;   TOTAL KNEE ARTHROPLASTY Left 08/25/2019   Procedure: TOTAL KNEE ARTHROPLASTY;  Surgeon: Liliane Rei, MD;  Location: [facility];  Service: Orthopedics;  Laterality:  Left;    Patient Active Problem List   Diagnosis Date Noted   Aortic atherosclerosis (HCC) 12/07/2020   S/P total knee arthroplasty, left 08/25/2019   S/P total knee arthroplasty 08/25/2019   Former smoker 03/26/2017   GERD (gastroesophageal reflux disease) 03/26/2017   Allergic rhinitis 03/26/2017   Cervical spondylosis with radiculopathy 11/30/2015   Insomnia 07/18/2015   TOS (thoracic outlet syndrome)    Cough 10/27/2008   Hyperlipidemia, unspecified 09/16/2008   Chronic rhinitis 09/16/2008   OA (osteoarthritis) of knee 09/16/2008   VERTIGO 09/16/2008    PCP: Almira Jaeger, MD   REFERRING PROVIDER: Virgie Griffith DIAG: [phone] cervical sponylosis  THERAPY DIAG:  Cervicalgia  Muscle weakness (generalized)  Rationale for Evaluation and Treatment: Rehabilitation  ONSET DATE: chronic neck pain                                          SUBJECTIVE:  SUBJECTIVE STATEMENT:He reports his overall soreness in his neck/upper trap area is about the same, has improved since starting PT but now is about the same, does always feel better after PT sessions  PERTINENT HISTORY: ZOX:WRUEAVWU fusion C3-T1, anx, claustrophobia,DJD, vertigo, PTSD,   PAIN:  NPRS scale: 3/10 upon arrival Pain location:sides of his lower neck Pain description: constant, soreness, no longer having the sharp pains he was having before starting PT last year Aggravating factors: moving his neck, working out Relieving factors: rest, meds   PRECAUTIONS: None  RED FLAGS: None     WEIGHT BEARING RESTRICTIONS: No  FALLS:  Has patient fallen in last 6 months? No   PLOF: Independent  PATIENT GOALS: reduce, wants to try DN.   NEXT MD VISIT: around June 20th   OBJECTIVE:   DIAGNOSTIC FINDINGS:  "AP and lateral radiographs of  cervical spine shows maintained cervical  lordosis intact ACDF hardware at C3-C4, C4-C5, C5-C6 and C7-T1. Facet  arthropathy noted bilaterally above the fusion at C2-C3. No fractures or  dislocations. "  PATIENT SURVEYS:  Patient-Specific Activity Scoring Scheme  "0" represents "unable to perform." "10" represents "able to perform at prior level. 0 1 2 3 4 5 6 7 8 9  10 (Date and Score)   Activity Eval     1. golf  0    2. Work out 7     Score 7/20    Total score = sum of the activity scores/number of activities Minimum detectable change (90%CI) for average score = 2 points Minimum detectable change (90%CI) for single activity score = 3 points     COGNITION: Overall cognitive status: Within functional limits for tasks assessed  PALPATION: Tender to palpation with pain rt sided cervical paraspinals and upper trap   CERVICAL ROM:   Active ROM A/PROM (deg) eval 01/21/24  Flexion 30 30  Extension 20 30  Right lateral flexion 20 20  Left lateral flexion 25 25  Right rotation 50 50  Left rotation 45 45   (Blank rows = not tested)  Neck strength: Eval:5/5 except extension 4/5 MMT  UPPER EXTREMITY ROM:WFL grossly bilat    UPPER EXTREMITY MMT:  MMT Right eval Left eval  Shoulder flexion 4 5  Shoulder extension    Shoulder abduction 5 5  Shoulder adduction    Shoulder extension    Shoulder internal rotation 5 5  Shoulder external rotation 4+ 5  Middle trapezius    Lower trapezius    Elbow flexion 5 5  Elbow extension 5 5  Wrist flexion    Wrist extension    Wrist ulnar deviation    Wrist radial deviation    Wrist pronation    Wrist supination    Grip strength WFL WFL   (Blank rows = not tested)   TODAY'S TREATMENT:  01/21/24 Cervical rotation stretch with self overpressure using hand, 5 sec hold X 10 bilat Upper trap stretch 5 sec X 10 bilat Updated neck ROM, see above for details  Manual therapy for skilled palpation and active compression applied  during Trigger Point Dry Needling  Subsequent Treatment: education provided at first treatment Patient Verbal Consent Given: Yes Education Handout Provided: Previously Provided Muscles Treated: cevical/thoracic multifidi bilat, upper traps bilat Electrical Stimulation Performed: Yes, Parameters:   Yes used micro amp current at 100 frequency to level tolerated as well as milli amp current at frequency 2 to level tolerated Treatment Response/Outcome:  good overall tolerance,twitch response noted    PATIENT EDUCATION: Education  details: HEP, PT plan of care Person educated: Patient Education method: Explanation, Demonstration, Verbal cues, and Handouts Education comprehension: verbalized understanding and needs further education   HOME EXERCISE PROGRAM: Access Code: [email or web address] URL: [email or web address] Date: 05/17/2023 Prepared by: Jamee Mazzoni  Exercises - Seated Assisted Cervical Rotation with Towel  - 2 x daily - 6 x weekly - 1-2 sets - 10 reps - 5 hold - Seated Levator Scapulae Stretch  - 2 x daily - 6 x weekly - 1 sets - 3 reps - 15 hold - Seated Cervical Sidebending Stretch  - 2 x daily - 6 x weekly - 1 sets - 3 reps - 15 hold - Seated Cervical Retraction  - 2 x daily - 6 x weekly - 1 sets - 10 reps - Standing Shoulder Row with Anchored Resistance  - 2 x daily - 6 x weekly - 2-3 sets - 10 reps - Shoulder External Rotation and Scapular Retraction with Resistance  - 2 x daily - 6 x weekly - 3 sets - 10 reps  ASSESSMENT:  CLINICAL IMPRESSION: Measured ROM at beginning of session and this was about the same from eval but he did show improved neck extension ROM which is reassuring. After DN it does appear that his ROM does improve when he does the exercises. He gets the best results from DN combined with Estim at same time.   OBJECTIVE IMPAIRMENTS: decreased activity tolerance, decreased neck mobility, decreased ROM, decreased strength, impaired flexibility,  postural  dysfunction, and pain.  ACTIVITY LIMITATIONS: tuning his head, lifting, driving PERSONAL FACTORS: CZY:[id number] fusion C3-T1, anx, claustrophobia,DJD, vertigo, PTSD, also affecting patient's functional outcome.  REHAB POTENTIAL: Good  CLINICAL DECISION MAKING: Stable/uncomplicated  EVALUATION COMPLEXITY: Low    GOALS: Short term PT Goals Target date: 01/17/2024 Pt will be I and compliant with HEP. Baseline:  Goal status: MET 01/21/24 Pt will decrease pain/soreness to overall less than 6/10 Baseline:7 Goal status: MET 01/21/24  Long term PT goals Target date:07/12/2023   Pt will improve neck AROM >10 deg each plane to improve functional mobility.  Baseline: Goal status: ongoing 01/21/24  Pt will improve PSFS to at least 9/20 functional to show improved function Baseline:7/20 Goal status: ongoing 01/21/24 Pt will reduce pain to overall less than 5/10 with usual activity and turning his head Baseline:7 Goal status: ongoing, some days this is met now, 01/21/24      4. Pt will improve neck strength to 5/5 MMT all planes  Goal status:ongoing 01/21/24  PLAN: PT FREQUENCY: 1-2 times per week   PT DURATION: 12 weeks  PLANNED INTERVENTIONS (unless contraindicated): aquatic PT, Canalith repositioning, cryotherapy, Electrical stimulation, Iontophoresis with 4 mg/ml dexamethasome, Moist heat, traction, Ultrasound, gait training, Therapeutic exercise, balance training, neuromuscular re-education, patient/family education, prosthetic training, manual techniques, passive ROM, dry needling, taping, vasopnuematic device, vestibular, spinal manipulations, joint manipulations  PLAN FOR NEXT SESSION: neck mobility work DN with estim if desired.   Mick Alamin, PT,DPT 01/21/2024, 12:30 PM   Date of referral: 12/13/23 Referring provider: Jaycee Metro, MD Referring diagnosis? 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 (if different than referring diagnosis) M54.2  What was this  (referring dx) caused by? Surgery (Type: cervical fusion), Ongoing Issue, and Arthritis  Nature of Condition: Recurrent (multiple episodes of < 3 months)   Laterality: Both  Current Functional Measure Score: Other   Patient-Specific Activity Scoring Scheme  "0" represents "unable to perform." "10" represents "able to perform at prior level. 0 1 2 3 4 5 6  7  8 9 10  (Date and Score)   Activity Eval     1. golf  0    2. Work out 7     Score 7/20    Objective measurements identify impairments when they are compared to normal values, the uninvolved extremity, and prior level of function.  [x]  Yes  []  No  Objective assessment of functional ability: Moderate functional limitations   Briefly describe symptoms: neck pain and stiffness  How did symptoms start: OA, surgery  Average pain intensity:  Last 24 hours: 7/10  Past week: 7/10  How often does the pt experience symptoms? Constantly  How much have the symptoms interfered with usual daily activities? Quite a bit  How has condition changed since care began at this facility? NA - initial visit  In general, how is the patients overall health? Very Good   BACK PAIN (STarT Back Screening Tool) No

## 2024-01-23 ENCOUNTER — Encounter: Admitting: Rehabilitative and Restorative Service Providers"

## 2024-01-28 ENCOUNTER — Ambulatory Visit: Admitting: Physical Therapy

## 2024-01-28 ENCOUNTER — Encounter: Payer: Self-pay | Admitting: Physical Therapy

## 2024-01-28 ENCOUNTER — Encounter: Admitting: Rehabilitative and Restorative Service Providers"

## 2024-01-28 DIAGNOSIS — M542 Cervicalgia: Secondary | ICD-10-CM

## 2024-01-28 DIAGNOSIS — M6281 Muscle weakness (generalized): Secondary | ICD-10-CM

## 2024-01-28 NOTE — Therapy (Signed)
 OUTPATIENT PHYSICAL THERAPY TREATMENT   Patient Name: Joshua [name]. MRN: [id number] DOB:07/02/1946, 78 y.o., male Today's Date: 01/28/2024  END OF SESSION:  PT End of Session - 01/28/24 0924     Visit Number 5    Number of Visits 15    Date for PT Re-Evaluation 03/13/24    Authorization Type UHC MCR    Authorization Time Period 3/27-7/10    Authorization - Visit Number 5    Authorization - Number of Visits 15    Progress Note Due on Visit 10    PT Start Time 0930    PT Stop Time 1012    PT Time Calculation (min) 42 min    Activity Tolerance Patient tolerated treatment well    Behavior During Therapy WFL for tasks assessed/performed                 Past Medical History:  Diagnosis Date   Chronic anxiety    Chronic rhinitis    Claustrophobia    Diverticulosis of colon (without mention of hemorrhage)    DJD (degenerative joint disease)    OA AND PAIN RIGHT KNEE. DDD including fusions lumbar spine   GERD (gastroesophageal reflux disease)    ONLY WITH CERTAIN FOODS   History of total right knee replacement    Hx of vertigo    Hyperlipidemia    Hypertension    PTSD (post-traumatic stress disorder)    [place] V[employer]   TOS (thoracic outlet syndrome)    Saw a physical therapist--PAIN RIGHT SHOULDER- MUCH IMPROVED   Past Surgical History:  Procedure Laterality Date   ANTERIOR CERVICAL DECOMP/DISCECTOMY FUSION N/A 11/30/2015   Procedure: CERVICAL SEVEN-THORACIC ONE ANTERIOR CERVICAL DISCECTOMY/FUSION;  Surgeon: Agustina Aldrich, MD;  Location: [facility];  Service: Neurosurgery;  Laterality: N/A;   CATARACT EXTRACTION     [facility] in [facility]24[facility]   CERVICAL DISC SURGERY  2009   c3-4 5-6/ by Dr Gwendlyn Lemmings- FUSION   COLONOSCOPY     TONSILLECTOMY     AS A CHILD   TOTAL KNEE ARTHROPLASTY Right 07/21/2014   Procedure: TOTAL RIGHT KNEE ARTHROPLASTY;  Surgeon: Bevin Bucks, MD;  Location: [facility];  Service: Orthopedics;  Laterality: Right;   TOTAL KNEE  ARTHROPLASTY Left 08/25/2019   Procedure: TOTAL KNEE ARTHROPLASTY;  Surgeon: Liliane Rei, MD;  Location: [facility];  Service: Orthopedics;  Laterality: Left;    Patient Active Problem List   Diagnosis Date Noted   Aortic atherosclerosis (HCC) 12/07/2020   S/P total knee arthroplasty, left 08/25/2019   S/P total knee arthroplasty 08/25/2019   Former smoker 03/26/2017   GERD (gastroesophageal reflux disease) 03/26/2017   Allergic rhinitis 03/26/2017   Cervical spondylosis with radiculopathy 11/30/2015   Insomnia 07/18/2015   TOS (thoracic outlet syndrome)    Cough 10/27/2008   Hyperlipidemia, unspecified 09/16/2008   Chronic rhinitis 09/16/2008   OA (osteoarthritis) of knee 09/16/2008   VERTIGO 09/16/2008    PCP: Almira Jaeger, MD   REFERRING PROVIDER: Virgie Griffith DIAG: [phone] cervical sponylosis  THERAPY DIAG:  Cervicalgia  Muscle weakness (generalized)  Rationale for Evaluation and Treatment: Rehabilitation  ONSET DATE: chronic neck pain                                          SUBJECTIVE:  SUBJECTIVE STATEMENT: "I'm sore, I stay sore."  Feels better for a few days after each session, but then the soreness and tightness comes back.  PERTINENT HISTORY: ZOX:WRUEAVWU fusion C3-T1, anx, claustrophobia,DJD, vertigo, PTSD,  PAIN:  NPRS scale: 5/10 upon arrival Pain location:sides of his lower neck Pain description: constant, soreness, no longer having the sharp pains he was having before starting PT last year Aggravating factors: moving his neck, working out Relieving factors: rest, meds   PRECAUTIONS: None  RED FLAGS: None     WEIGHT BEARING RESTRICTIONS: No  FALLS:  Has patient fallen in last 6 months? No   PLOF: Independent  PATIENT GOALS: reduce, wants to try DN.   NEXT MD  VISIT: around June 20th   OBJECTIVE:   DIAGNOSTIC FINDINGS:  "AP and lateral radiographs of cervical spine shows maintained cervical  lordosis intact ACDF hardware at C3-C4, C4-C5, C5-C6 and C7-T1. Facet  arthropathy noted bilaterally above the fusion at C2-C3. No fractures or  dislocations. "  PATIENT SURVEYS:  Patient-Specific Activity Scoring Scheme  "0" represents "unable to perform." "10" represents "able to perform at prior level. 0 1 2 3 4 5 6 7 8 9  10 (Date and Score)   Activity Eval     1. golf 0    2. Work out 7     Score 7/20    Total score = sum of the activity scores/number of activities Minimum detectable change (90%CI) for average score = 2 points Minimum detectable change (90%CI) for single activity score = 3 points     COGNITION: Overall cognitive status: Within functional limits for tasks assessed  PALPATION: Tender to palpation with pain rt sided cervical paraspinals and upper trap   CERVICAL ROM:   Active ROM A/PROM (deg) eval 01/21/24  Flexion 30 30  Extension 20 30  Right lateral flexion 20 20  Left lateral flexion 25 25  Right rotation 50 50  Left rotation 45 45   (Blank rows = not tested)  Neck strength: Eval:5/5 except extension 4/5 MMT  UPPER EXTREMITY ROM:WFL grossly bilat    UPPER EXTREMITY MMT:  MMT Right eval Left eval  Shoulder flexion 4 5  Shoulder abduction 5 5  Shoulder internal rotation 5 5  Shoulder external rotation 4+ 5  Elbow flexion 5 5  Elbow extension 5 5  Grip strength WFL WFL   (Blank rows = not tested)   TODAY'S TREATMENT:  01/28/24 TherEx Cervical rotation stretch with self overpressure using hand, 5 sec hold X 10 bilat ER with scapular retraction x 10 reps; L3 band Discussed current gym program; discussed reintroducing rows as able; pt also doing 8 sets of 10 reps at the gym so discussed back off on number of reps as well and increasing resistance with less reps as he tolerates  Manual therapy STM  with compression to Lt scalenes, cervical paraspinals and upper traps, skilled palpation and monitoring of soft tissue during DN  Trigger Point Dry Needling  Subsequent Treatment: education provided at first treatment Patient Verbal Consent Given: Yes Education Handout Provided: Previously Provided Muscles Treated: cevical/thoracic multifidi bilat, upper traps bilat, Lt scalene Electrical Stimulation Performed: Yes, Parameters:  Yes used micro amp current at 100 frequency to level tolerated as well as milli amp current at frequency 2 to level tolerated Treatment Response/Outcome:  good overall tolerance,twitch response noted   01/21/24 Cervical rotation stretch with self overpressure using hand, 5 sec hold X 10 bilat Upper trap stretch 5 sec X 10 bilat  Updated neck ROM, see above for details  Manual therapy for skilled palpation and active compression applied during Trigger Point Dry Needling  Subsequent Treatment: education provided at first treatment Patient Verbal Consent Given: Yes Education Handout Provided: Previously Provided Muscles Treated: cevical/thoracic multifidi bilat, upper traps bilat Electrical Stimulation Performed: Yes, Parameters:   Yes used micro amp current at 100 frequency to level tolerated as well as milli amp current at frequency 2 to level tolerated Treatment Response/Outcome:  good overall tolerance,twitch response noted    PATIENT EDUCATION: Education details: HEP, PT plan of care Person educated: Patient Education method: Explanation, Demonstration, Verbal cues, and Handouts Education comprehension: verbalized understanding and needs further education   HOME EXERCISE PROGRAM: Access Code: [email or web address] URL: [email or web address] Date: 05/17/2023 Prepared by: Jamee Mazzoni  Exercises - Seated Assisted Cervical Rotation with Towel  - 2 x daily - 6 x weekly - 1-2 sets - 10 reps - 5 hold - Seated Levator Scapulae Stretch  - 2 x daily - 6 x  weekly - 1 sets - 3 reps - 15 hold - Seated Cervical Sidebending Stretch  - 2 x daily - 6 x weekly - 1 sets - 3 reps - 15 hold - Seated Cervical Retraction  - 2 x daily - 6 x weekly - 1 sets - 10 reps - Standing Shoulder Row with Anchored Resistance  - 2 x daily - 6 x weekly - 2-3 sets - 10 reps - Shoulder External Rotation and Scapular Retraction with Resistance  - 2 x daily - 6 x weekly - 3 sets - 10 reps  ASSESSMENT:  CLINICAL IMPRESSION:  Pt tolerated session well today with reduction in pain following session.  Recommended some modifications to current gym program but pt currently reluctant to changing.  Overall improvements appear to be short-term at this time.  Will continue to benefit from PT to maximize function.   OBJECTIVE IMPAIRMENTS: decreased activity tolerance, decreased neck mobility, decreased ROM, decreased strength, impaired flexibility,  postural dysfunction, and pain.  ACTIVITY LIMITATIONS: tuning his head, lifting, driving PERSONAL FACTORS: DUK:GURKYHCW fusion C3-T1, anx, claustrophobia,DJD, vertigo, PTSD, also affecting patient's functional outcome.  REHAB POTENTIAL: Good  CLINICAL DECISION MAKING: Stable/uncomplicated  EVALUATION COMPLEXITY: Low    GOALS: Short term PT Goals Target date: 01/17/2024 Pt will be I and compliant with HEP. Baseline:  Goal status: MET 01/21/24 Pt will decrease pain/soreness to overall less than 6/10 Baseline:7 Goal status: MET 01/21/24  Long term PT goals Target date:07/12/2023   Pt will improve neck AROM >10 deg each plane to improve functional mobility.  Baseline: Goal status: ongoing 01/21/24  Pt will improve PSFS to at least 9/20 functional to show improved function Baseline:7/20 Goal status: ongoing 01/21/24 Pt will reduce pain to overall less than 5/10 with usual activity and turning his head Baseline:7 Goal status: ongoing, some days this is met now, 01/21/24      4. Pt will improve neck strength to 5/5 MMT all  planes  Goal status:ongoing 01/21/24  PLAN: PT FREQUENCY: 1-2 times per week   PT DURATION: 12 weeks  PLANNED INTERVENTIONS (unless contraindicated): aquatic PT, Canalith repositioning, cryotherapy, Electrical stimulation, Iontophoresis with 4 mg/ml dexamethasome, Moist heat, traction, Ultrasound, gait training, Therapeutic exercise, balance training, neuromuscular re-education, patient/family education, prosthetic training, manual techniques, passive ROM, dry needling, taping, vasopnuematic device, vestibular, spinal manipulations, joint manipulations  PLAN FOR NEXT SESSION:  back strengthening exercises,  neck mobility work DN with estim if desired.   Marley Simmers, PT, DPT 01/28/24  11:14 AM    Date of referral: 12/13/23 Referring provider: Jaycee Metro, MD Referring diagnosis? 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 (if different than referring diagnosis) M54.2  What was this (referring dx) caused by? Surgery (Type: cervical fusion), Ongoing Issue, and Arthritis  Nature of Condition: Recurrent (multiple episodes of < 3 months)   Laterality: Both  Current Functional Measure Score: Other   Patient-Specific Activity Scoring Scheme  "0" represents "unable to perform." "10" represents "able to perform at prior level. 0 1 2 3 4 5 6 7 8 9  10 (Date and Score)   Activity Eval     1. golf  0    2. Work out 7     Score 7/20    Objective measurements identify impairments when they are compared to normal values, the uninvolved extremity, and prior level of function.  [x]  Yes  []  No  Objective assessment of functional ability: Moderate functional limitations   Briefly describe symptoms: neck pain and stiffness  How did symptoms start: OA, surgery  Average pain intensity:  Last 24 hours: 7/10  Past week: 7/10  How often does the pt experience symptoms? Constantly  How much have the symptoms interfered with usual daily activities? Quite a bit  How has  condition changed since care began at this facility? NA - initial visit  In general, how is the patients overall health? Very Good   BACK PAIN (STarT Back Screening Tool) No

## 2024-01-31 ENCOUNTER — Encounter: Admitting: Rehabilitative and Restorative Service Providers"

## 2024-02-06 ENCOUNTER — Ambulatory Visit: Admitting: Physical Therapy

## 2024-02-06 ENCOUNTER — Encounter: Payer: Self-pay | Admitting: Physical Therapy

## 2024-02-06 DIAGNOSIS — M6281 Muscle weakness (generalized): Secondary | ICD-10-CM

## 2024-02-06 DIAGNOSIS — M542 Cervicalgia: Secondary | ICD-10-CM

## 2024-02-06 NOTE — Therapy (Signed)
 OUTPATIENT PHYSICAL THERAPY TREATMENT DISCHARGE SUMMARY   Patient Name: Joshua [name]. MRN: [id number] DOB:12-09-1945, 78 y.o., male Today's Date: 02/06/2024  END OF SESSION:  PT End of Session - 02/06/24 0920     Visit Number 6    Number of Visits 15    Date for PT Re-Evaluation 03/13/24    Authorization Type [employer] MCR    Authorization Time Period 3/27-7/10    Authorization - Visit Number 6    Authorization - Number of Visits 15    Progress Note Due on Visit 10    PT Start Time 0920    PT Stop Time 1000    PT Time Calculation (min) 40 min    Activity Tolerance Patient tolerated treatment well    Behavior During Therapy WFL for tasks assessed/performed                  Past Medical History:  Diagnosis Date   Chronic anxiety    Chronic rhinitis    Claustrophobia    Diverticulosis of colon (without mention of hemorrhage)    DJD (degenerative joint disease)    OA AND PAIN RIGHT KNEE. DDD including fusions lumbar spine   GERD (gastroesophageal reflux disease)    ONLY WITH CERTAIN FOODS   History of total right knee replacement    Hx of vertigo    Hyperlipidemia    Hypertension    PTSD (post-traumatic stress disorder)    [place] [employer]   TOS (thoracic outlet syndrome)    Saw a physical therapist--PAIN RIGHT SHOULDER- MUCH IMPROVED   Past Surgical History:  Procedure Laterality Date   ANTERIOR CERVICAL DECOMP/DISCECTOMY FUSION N/A 11/30/2015   Procedure: CERVICAL SEVEN-THORACIC ONE ANTERIOR CERVICAL DISCECTOMY/FUSION;  Surgeon: Agustina Aldrich, MD;  Location: [facility];  Service: Neurosurgery;  Laterality: N/A;   CATARACT EXTRACTION     right in 2024, [facility]   CERVICAL DISC SURGERY  2009   c3-4 5-6/ by Dr Gwendlyn Lemmings- FUSION   COLONOSCOPY     TONSILLECTOMY     AS A CHILD   TOTAL KNEE ARTHROPLASTY Right 07/21/2014   Procedure: TOTAL RIGHT KNEE ARTHROPLASTY;  Surgeon: Bevin Bucks, MD;  Location: [facility];  Service: Orthopedics;  Laterality:  Right;   TOTAL KNEE ARTHROPLASTY Left 08/25/2019   Procedure: TOTAL KNEE ARTHROPLASTY;  Surgeon: Liliane Rei, MD;  Location: [facility];  Service: Orthopedics;  Laterality: Left;    Patient Active Problem List   Diagnosis Date Noted   Aortic atherosclerosis (HCC) 12/07/2020   S/P total knee arthroplasty, left 08/25/2019   S/P total knee arthroplasty 08/25/2019   Former smoker 03/26/2017   GERD (gastroesophageal reflux disease) 03/26/2017   Allergic rhinitis 03/26/2017   Cervical spondylosis with radiculopathy 11/30/2015   Insomnia 07/18/2015   TOS (thoracic outlet syndrome)    Cough 10/27/2008   Hyperlipidemia, unspecified 09/16/2008   Chronic rhinitis 09/16/2008   OA (osteoarthritis) of knee 09/16/2008   VERTIGO 09/16/2008    PCP: Almira Jaeger, MD   REFERRING PROVIDER: Agustina Aldrich, MD  REFERRING DIAG: [phone] cervical sponylosis  THERAPY DIAG:  Cervicalgia  Muscle weakness (generalized)  Rationale for Evaluation and Treatment: Rehabilitation  ONSET DATE: chronic neck pain                                          SUBJECTIVE:  SUBJECTIVE STATEMENT: "I stay sore."  And feels like he's a little more grumpy as well.   PERTINENT HISTORY: ZOX:WRUEAVWU fusion C3-T1, anx, claustrophobia,DJD, vertigo, PTSD  PAIN:  NPRS scale: no pain; just soreness/10 upon arrival Pain location:sides of his lower neck Pain description: constant, soreness, no longer having the sharp pains he was having before starting PT last year Aggravating factors: moving his neck, working out Relieving factors: rest, meds   PRECAUTIONS: None  RED FLAGS: None     WEIGHT BEARING RESTRICTIONS: No  FALLS:  Has patient fallen in last 6 months? No   PLOF: Independent  PATIENT GOALS: reduce, wants to try DN.   NEXT MD VISIT:  around June 20th   OBJECTIVE:   DIAGNOSTIC FINDINGS:  "AP and lateral radiographs of cervical spine shows maintained cervical  lordosis intact ACDF hardware at C3-C4, C4-C5, C5-C6 and C7-T1. Facet  arthropathy noted bilaterally above the fusion at C2-C3. No fractures or  dislocations. "  PATIENT SURVEYS:  Patient-Specific Activity Scoring Scheme  "0" represents "unable to perform." "10" represents "able to perform at prior level. 0 1 2 3 4 5 6 7 8 9  10 (Date and Score)   Activity Eval  02/06/24   1. golf 0  0  2. Work out 7   7  Score 3.5 3.5   Total score = sum of the activity scores/number of activities Minimum detectable change (90%CI) for average score = 2 points Minimum detectable change (90%CI) for single activity score = 3 points     COGNITION: Overall cognitive status: Within functional limits for tasks assessed  PALPATION: Tender to palpation with pain rt sided cervical paraspinals and upper trap   CERVICAL ROM:   Active ROM A/PROM (deg) eval 01/21/24 02/06/24  Flexion 30 30 30   Extension 20 30 30   Right lateral flexion 20 20 28   Left lateral flexion 25 25 28   Right rotation 50 50 50  Left rotation 45 45 61   (Blank rows = not tested)  Neck strength: Eval:5/5 except extension 4/5 MMT  UPPER EXTREMITY ROM:WFL grossly bilat    UPPER EXTREMITY MMT:  MMT Right eval Left eval  Shoulder flexion 4 5  Shoulder abduction 5 5  Shoulder internal rotation 5 5  Shoulder external rotation 4+ 5  Elbow flexion 5 5  Elbow extension 5 5  Grip strength WFL WFL   (Blank rows = not tested)   TODAY'S TREATMENT:  02/06/24 TherEx ROM measurements- see above for details Discussed/reviewed gym modification with pt demonstrating a good understanding  Self Care Long discussion with pt about limited progress and change in symptoms/function.  At this time recommend return to MD to discuss further options.  Pt verbalized understanding.  Manual therapy STM with  compression to Lt upper traps, skilled palpation and monitoring of soft tissue during DN  Trigger Point Dry Needling  Subsequent Treatment: education provided at first treatment Patient Verbal Consent Given: Yes Education Handout Provided: Previously Provided Muscles Treated: Lt upper trap Electrical Stimulation Performed: NO Treatment Response/Outcome:  good overall tolerance,twitch response noted   01/28/24 TherEx Cervical rotation stretch with self overpressure using hand, 5 sec hold X 10 bilat ER with scapular retraction x 10 reps; L3 band Discussed current gym program; discussed reintroducing rows as able; pt also doing 8 sets of 10 reps at the gym so discussed back off on number of reps as well and increasing resistance with less reps as he tolerates  Manual therapy STM with compression to Lt  scalenes, cervical paraspinals and upper traps, skilled palpation and monitoring of soft tissue during DN  Trigger Point Dry Needling  Subsequent Treatment: education provided at first treatment Patient Verbal Consent Given: Yes Education Handout Provided: Previously Provided Muscles Treated: cevical/thoracic multifidi bilat, upper traps bilat, Lt scalene Electrical Stimulation Performed: Yes, Parameters:  Yes used micro amp current at 100 frequency to level tolerated as well as milli amp current at frequency 2 to level tolerated Treatment Response/Outcome:  good overall tolerance,twitch response noted   01/21/24 Cervical rotation stretch with self overpressure using hand, 5 sec hold X 10 bilat Upper trap stretch 5 sec X 10 bilat Updated neck ROM, see above for details  Manual therapy for skilled palpation and active compression applied during Trigger Point Dry Needling  Subsequent Treatment: education provided at first treatment Patient Verbal Consent Given: Yes Education Handout Provided: Previously Provided Muscles Treated: cevical/thoracic multifidi bilat, upper traps  bilat Electrical Stimulation Performed: Yes, Parameters:   Yes used micro amp current at 100 frequency to level tolerated as well as milli amp current at frequency 2 to level tolerated Treatment Response/Outcome:  good overall tolerance,twitch response noted    PATIENT EDUCATION: Education details: HEP, PT plan of care Person educated: Patient Education method: Explanation, Demonstration, Verbal cues, and Handouts Education comprehension: verbalized understanding and needs further education   HOME EXERCISE PROGRAM: Access Code: [email or web address] URL: [email or web address] Date: 05/17/2023 Prepared by: Jamee Mazzoni  Exercises - Seated Assisted Cervical Rotation with Towel  - 2 x daily - 6 x weekly - 1-2 sets - 10 reps - 5 hold - Seated Levator Scapulae Stretch  - 2 x daily - 6 x weekly - 1 sets - 3 reps - 15 hold - Seated Cervical Sidebending Stretch  - 2 x daily - 6 x weekly - 1 sets - 3 reps - 15 hold - Seated Cervical Retraction  - 2 x daily - 6 x weekly - 1 sets - 10 reps - Standing Shoulder Row with Anchored Resistance  - 2 x daily - 6 x weekly - 2-3 sets - 10 reps - Shoulder External Rotation and Scapular Retraction with Resistance  - 2 x daily - 6 x weekly - 3 sets - 10 reps  ASSESSMENT:  CLINICAL IMPRESSION:  Pt has met 1 LTG at this time but otherwise limited progress has been made with PT.  At this time recommend pt return to MD to discuss other potential treatment options.  Will d/c PT today.   OBJECTIVE IMPAIRMENTS: decreased activity tolerance, decreased neck mobility, decreased ROM, decreased strength, impaired flexibility,  postural dysfunction, and pain.  ACTIVITY LIMITATIONS: tuning his head, lifting, driving PERSONAL FACTORS: ZOX:WRUEAVWU fusion C3-T1, anx, claustrophobia,DJD, vertigo, PTSD, also affecting patient's functional outcome.  REHAB POTENTIAL: Good  CLINICAL DECISION MAKING: Stable/uncomplicated  EVALUATION COMPLEXITY: Low    GOALS: Short  term PT Goals Target date: 01/17/2024 Pt will be I and compliant with HEP. Baseline:  Goal status: MET 01/21/24  Pt will decrease pain/soreness to overall less than 6/10 Baseline:7 Goal status: MET 01/21/24  Long term PT goals Target date:07/12/2023   Pt will improve neck AROM >10 deg each plane to improve functional mobility.  Baseline: Goal status: Partially Met 02/06/24  Pt will improve PSFS to at least 9/20 functional to show improved function Baseline:7/20 Goal status: Not Met 02/06/24  Pt will reduce pain to overall less than 5/10 with usual activity and turning his head Baseline:7 Goal status: PARTIALLY MET 01/21/24  4. Pt will improve neck  strength to 5/5 MMT all planes Goal status:MET 01/21/24  PLAN: PT FREQUENCY: 1-2 times per week   PT DURATION: 12 weeks  PLANNED INTERVENTIONS (unless contraindicated): aquatic PT, Canalith repositioning, cryotherapy, Electrical stimulation, Iontophoresis with 4 mg/ml dexamethasome, Moist heat, traction, Ultrasound, gait training, Therapeutic exercise, balance training, neuromuscular re-education, patient/family education, prosthetic training, manual techniques, passive ROM, dry needling, taping, vasopnuematic device, vestibular, spinal manipulations, joint manipulations  PLAN FOR NEXT SESSION:  d/c PT today    Marley Simmers, PT, DPT 02/06/24 11:14 AM    Date of referral: 12/13/23 Referring provider: Jaycee Metro, MD Referring diagnosis? W09.811 cervical sponylosis Treatment diagnosis? (if different than referring diagnosis) M54.2  What was this (referring dx) caused by? Surgery (Type: cervical fusion), Ongoing Issue, and Arthritis  Nature of Condition: Recurrent (multiple episodes of < 3 months)   Laterality: Both  Current Functional Measure Score: Other   Patient-Specific Activity Scoring Scheme  "0" represents "unable to perform." "10" represents "able to perform at prior level. 0 1 2 3 4 5 6 7 8 9  10 (Date and  Score)   Activity Eval     1. golf  0    2. Work out 7     Score 7/20    Objective measurements identify impairments when they are compared to normal values, the uninvolved extremity, and prior level of function.  [x]  Yes  []  No  Objective assessment of functional ability: Moderate functional limitations   Briefly describe symptoms: neck pain and stiffness  How did symptoms start: OA, surgery  Average pain intensity:  Last 24 hours: 7/10  Past week: 7/10  How often does the pt experience symptoms? Constantly  How much have the symptoms interfered with usual daily activities? Quite a bit  How has condition changed since care began at this facility? NA - initial visit  In general, how is the patients overall health? Very Good   BACK PAIN (STarT Back Screening Tool) No     PHYSICAL THERAPY DISCHARGE SUMMARY  Visits from Start of Care: 6  Current functional level related to goals / functional outcomes: See above   Remaining deficits: See above   Education / Equipment: HEP, DN  Patient agrees to discharge. Patient goals were partially met. Patient is being discharged due to did not respond to therapy.    Marley Simmers, PT, DPT 02/06/24 11:18 AM  [facility] [address] [place], [place], [address] Phone: [phone]   Fax:  [phone]

## 2024-02-12 ENCOUNTER — Encounter: Admitting: Physical Therapy

## 2024-02-21 ENCOUNTER — Encounter: Admitting: Physical Therapy

## 2024-03-13 DIAGNOSIS — M47812 Spondylosis without myelopathy or radiculopathy, cervical region: Secondary | ICD-10-CM | POA: Diagnosis not present

## 2024-05-01 ENCOUNTER — Encounter: Payer: Self-pay | Admitting: Family Medicine

## 2024-05-01 ENCOUNTER — Ambulatory Visit: Payer: Self-pay | Admitting: Family Medicine

## 2024-05-01 ENCOUNTER — Ambulatory Visit (INDEPENDENT_AMBULATORY_CARE_PROVIDER_SITE_OTHER): Admitting: Family Medicine

## 2024-05-01 VITALS — BP 110/72 | HR 59 | Temp 97.7°F | Ht 72.0 in | Wt 185.0 lb

## 2024-05-01 DIAGNOSIS — Z131 Encounter for screening for diabetes mellitus: Secondary | ICD-10-CM

## 2024-05-01 DIAGNOSIS — E785 Hyperlipidemia, unspecified: Secondary | ICD-10-CM | POA: Diagnosis not present

## 2024-05-01 DIAGNOSIS — F5101 Primary insomnia: Secondary | ICD-10-CM | POA: Diagnosis not present

## 2024-05-01 DIAGNOSIS — E663 Overweight: Secondary | ICD-10-CM | POA: Diagnosis not present

## 2024-05-01 LAB — COMPREHENSIVE METABOLIC PANEL WITH GFR
ALT: 13 U/L (ref 0–53)
AST: 17 U/L (ref 0–37)
Albumin: 4.1 g/dL (ref 3.5–5.2)
Alkaline Phosphatase: 54 U/L (ref 39–117)
BUN: 16 mg/dL (ref 6–23)
CO2: 25 meq/L (ref 19–32)
Calcium: 8.9 mg/dL (ref 8.4–10.5)
Chloride: 104 meq/L (ref 96–112)
Creatinine, Ser: 1.06 mg/dL (ref 0.40–1.50)
GFR: 67.09 mL/min (ref >60.00)
Glucose, Bld: 81 mg/dL (ref 70–99)
Potassium: 4.2 meq/L (ref 3.5–5.1)
Sodium: 141 meq/L (ref 135–145)
Total Bilirubin: 1 mg/dL (ref 0.2–1.2)
Total Protein: 7.3 g/dL (ref 6.0–8.3)

## 2024-05-01 LAB — CBC WITH DIFFERENTIAL/PLATELET
Basophils Absolute: 0 K/uL (ref 0.0–0.1)
Basophils Relative: 0.5 % (ref 0.0–3.0)
Eosinophils Absolute: 0.3 K/uL (ref 0.0–0.7)
Eosinophils Relative: 3.8 % (ref 0.0–5.0)
HCT: 43.9 % (ref 39.0–52.0)
Hemoglobin: 14.7 g/dL (ref 13.0–17.0)
Lymphocytes Relative: 29.8 % (ref 12.0–46.0)
Lymphs Abs: 2 K/uL (ref 0.7–4.0)
MCHC: 33.5 g/dL (ref 30.0–36.0)
MCV: 92.9 fl (ref 78.0–100.0)
Monocytes Absolute: 0.7 K/uL (ref 0.1–1.0)
Monocytes Relative: 10.9 % (ref 3.0–12.0)
Neutro Abs: 3.7 K/uL (ref 1.4–7.7)
Neutrophils Relative %: 55 % (ref 43.0–77.0)
Platelets: 241 K/uL (ref 150.0–400.0)
RBC: 4.73 Mil/uL (ref 4.22–5.81)
RDW: 14.4 % (ref 11.5–15.5)
WBC: 6.8 K/uL (ref 4.0–10.5)

## 2024-05-01 LAB — HEMOGLOBIN A1C: Hgb A1c MFr Bld: 5.2 % (ref 4.6–6.5)

## 2024-05-01 LAB — LIPID PANEL
Cholesterol: 180 mg/dL (ref 0–200)
HDL: 47.6 mg/dL (ref >39.00)
LDL Cholesterol: 117 mg/dL — ABNORMAL HIGH (ref 0–99)
NonHDL: 132.48
Total CHOL/HDL Ratio: 4
Triglycerides: 76 mg/dL (ref 0.0–149.0)
VLDL: 15.2 mg/dL (ref 0.0–40.0)

## 2024-05-01 MED ORDER — TRAZODONE HCL 50 MG PO TABS
25.0000 mg | ORAL_TABLET | Freq: Every evening | ORAL | 3 refills | Status: AC | PRN
Start: 2024-05-01 — End: ?

## 2024-05-01 NOTE — Patient Instructions (Addendum)
 Short term sleep reset with melatonin for 2 weeks -try to have consistent bedtime within about 15 minutes for these 2 weeks - stop ALL screens at least 30 minutes before bed- do something without screens that is not too mentally stimulating- leisure reading, folding socks, time with pets - at the same time you stop screens start to turn the lights down -consider black out shades or sleep mask such as [place]  bear mask for abotu $10 on amazon -also at the same time take melatonin 1-3 mg - if not doing better after about 2 weeks let me know or can trial the trazodone  that I sent in for you today -update me in 2-3 weeks with how things are going  Please stop by lab before you go If you have mychart- we will send your results within 3 business days of us  receiving them.  If you do not have mychart- we will call you about results within 5 business days of us  receiving them.  *please also note that you will see labs on mychart as soon as they post. I will later go in and write notes on them- will say notes from Dr. Katrinka   Recommended follow up: Return in about 6 months (around 11/01/2024) for physical or sooner if needed.Schedule b4 you leave.

## 2024-05-01 NOTE — Progress Notes (Addendum)
 Phone [phone] In person visit   Subjective:   Joshua JAYSON Elisabeth Mickey. is a 78 y.o. year old very pleasant male patient who presents for/with See problem oriented charting Chief Complaint  Patient presents with   Medication Discussion    Pt would like to speak about Crestor  and cramping;     Trouble Sleeping    Past Medical History-  Patient Active Problem List   Diagnosis Date Noted   Aortic atherosclerosis (HCC) 12/07/2020    Priority: Medium    Insomnia 07/18/2015    Priority: Medium    Hyperlipidemia, unspecified 09/16/2008    Priority: Medium    Chronic rhinitis 09/16/2008    Priority: Medium    Former smoker 03/26/2017    Priority: Low   GERD (gastroesophageal reflux disease) 03/26/2017    Priority: Low   Allergic rhinitis 03/26/2017    Priority: Low   Cervical spondylosis with radiculopathy 11/30/2015    Priority: Low   TOS (thoracic outlet syndrome)     Priority: Low   Cough 10/27/2008    Priority: Low   OA (osteoarthritis) of knee 09/16/2008    Priority: Low   VERTIGO 09/16/2008    Priority: Low   S/P total knee arthroplasty, left 08/25/2019   S/P total knee arthroplasty 08/25/2019    Medications- reviewed and updated Current Outpatient Medications  Medication Sig Dispense Refill   traZODone  (DESYREL ) 50 MG tablet Take 0.5-1 tablets (25-50 mg total) by mouth at bedtime as needed for sleep. 30 tablet 3   No current facility-administered medications for this visit.     Objective:  BP 110/72 (BP Location: Left Arm, Patient Position: Sitting, Cuff Size: Normal)   Pulse (!) 59   Temp 97.7 F (36.5 C) (Temporal)   Ht 6' (1.829 m)   Wt 185 lb (83.9 kg)   SpO2 99%   BMI 25.09 kg/m  Gen: NAD, resting comfortably CV: RRR no murmurs rubs or gallops Lungs: CTAB no crackles, wheeze, rhonchi Ext: no edema Skin: warm, dry     Assessment and Plan   # insomnia S:Medication: none recently  -2014 trial of trazodone  but doesn't recall with Dr.  Darlean -tried xanax  through [place]  - staying asleep is his main issue -trial melatonin in the past A/P: insomnia mainly sleep maintenance -Short term sleep reset with melatonin for 2 weeks -try to have consistent bedtime within about 15 minutes for these 2 weeks - stop ALL screens at least 30 minutes before bed- do something without screens that is not too mentally stimulating- leisure reading, folding socks, time with pets - at the same time you stop screens start to turn the lights down -consider black out shades or sleep mask such as [place]  bear mask for abotu $10 on amazon -also at the same time take melatonin 1-3 mg - if not doing better after about 2 weeks let me know or can trial the trazodone  that I sent in for you today -update me in 2-3 weeks with how things are going -belsomra back up option -doxepin after that  #hyperlipidemia- peak LDL 143 so target at least LDL 100 prefer under 70 with aortic atherosclerosis with CT calcium  scoring 161-46 percentile for age 64/25/2020 # Aortic atherosclerosis S: Medication: Rosuvastatin  20Mg  once a week in past - he trialed off of medicine but cramping persisted - does better with hydration but difficult to convince himself to execute. He has not restarted Lab Results  Component Value Date   CHOL 149 11/02/2022   HDL  44.60 11/02/2022   LDLCALC 92 11/02/2022   LDLDIRECT 122.0 10/23/2019   TRIG 61.0 11/02/2022   CHOLHDL 3 11/02/2022   A/P: Suspect lipids poorly controlled particular with aortic atherosclerosis and coronary artery calcium  and him come off of statin-he would like to update lipid levels and I ordered these today.  He wants to remain off of medicine for now  # Cervical disc disease- history of 4 fusions of discs S: Working with Dr. Malcolm and Dr. Arlyss, neurosurgery.  History of right C2-C3 medial branch block  (not helpful though) with consideration of radiofrequency ablation.  Much improved on subsequent follow-ups and with PT  ended 2025 -not surgical candidate per Dr. Louis -essentially dealing with the pain. Sparing ibuprofen only if bad- 600mg  3x a week. Still going to the gym A/P: Continues to be an issue for him but he is tolerating it.  We discussed with ibuprofen use needing to check renal function every 6 months-he agrees to this today  Recommended follow up: Return in about 6 months (around 11/01/2024) for physical or sooner if needed.Schedule b4 you leave. Future Appointments  Date Time Provider Department Center  12/29/2024  3:00 PM [facility] ANNUAL WELLNESS VISIT 1 [facility]   Lab/Order associations: Fasting   ICD-10-CM   1. Hyperlipidemia, unspecified hyperlipidemia type  E78.5 CBC with Differential/Platelet    Comprehensive metabolic panel with GFR    Lipid panel    2. Primary insomnia  F51.01     3. Screening for diabetes mellitus  Z13.1 Hemoglobin A1c    4. Overweight  E66.3 Hemoglobin A1c     Meds ordered this encounter  Medications   traZODone  (DESYREL ) 50 MG tablet    Sig: Take 0.5-1 tablets (25-50 mg total) by mouth at bedtime as needed for sleep.    Dispense:  30 tablet    Refill:  3    Return precautions advised.  Garnette Lukes, MD

## 2024-05-01 NOTE — Addendum Note (Signed)
 Addended by: KATRINKA GARNETTE KIDD on: 05/01/2024 11:47 AM   Modules accepted: Level of Service

## 2024-05-06 ENCOUNTER — Other Ambulatory Visit: Payer: Self-pay | Admitting: Family Medicine

## 2024-05-06 ENCOUNTER — Encounter: Payer: Self-pay | Admitting: Family Medicine

## 2024-05-06 MED ORDER — ROSUVASTATIN CALCIUM 20 MG PO TABS: 20.0000 mg | ORAL_TABLET | ORAL | 0 refills | Status: DC

## 2024-07-27 ENCOUNTER — Other Ambulatory Visit: Payer: Self-pay | Admitting: Family Medicine

## 2024-09-23 ENCOUNTER — Ambulatory Visit (INDEPENDENT_AMBULATORY_CARE_PROVIDER_SITE_OTHER)

## 2024-09-23 DIAGNOSIS — Z23 Encounter for immunization: Secondary | ICD-10-CM | POA: Diagnosis not present

## 2024-09-23 NOTE — Progress Notes (Signed)
 Patient is in office today for a nurse visit for Immunization. Patient Injection was given in the  Left deltoid. Patient tolerated injection well.

## 2024-11-04 ENCOUNTER — Encounter: Admitting: Family Medicine

## 2024-12-29 ENCOUNTER — Ambulatory Visit
